# Patient Record
Sex: Male | Born: 1960
Health system: Southern US, Community
[De-identification: ages and names within clinical notes are randomized; demographics above are authoritative.]

## PROBLEM LIST (undated history)

## (undated) DIAGNOSIS — I10 Essential (primary) hypertension: Secondary | ICD-10-CM

## (undated) DIAGNOSIS — K219 Gastro-esophageal reflux disease without esophagitis: Secondary | ICD-10-CM

## (undated) DIAGNOSIS — F32A Depression, unspecified: Secondary | ICD-10-CM

## (undated) DIAGNOSIS — Z972 Presence of dental prosthetic device (complete) (partial): Secondary | ICD-10-CM

## (undated) DIAGNOSIS — M1990 Unspecified osteoarthritis, unspecified site: Secondary | ICD-10-CM

## (undated) DIAGNOSIS — F329 Major depressive disorder, single episode, unspecified: Secondary | ICD-10-CM

## (undated) DIAGNOSIS — M19019 Primary osteoarthritis, unspecified shoulder: Secondary | ICD-10-CM

## (undated) DIAGNOSIS — K429 Umbilical hernia without obstruction or gangrene: Secondary | ICD-10-CM

## (undated) DIAGNOSIS — F419 Anxiety disorder, unspecified: Secondary | ICD-10-CM

## (undated) DIAGNOSIS — A488 Other specified bacterial diseases: Secondary | ICD-10-CM

## (undated) DIAGNOSIS — R569 Unspecified convulsions: Secondary | ICD-10-CM

## (undated) HISTORY — PX: FRACTURE SURGERY: SHX138

## (undated) HISTORY — PX: KNEE ARTHROCENTESIS: SUR44

## (undated) HISTORY — PX: MULTIPLE TOOTH EXTRACTIONS: SHX2053

## (undated) HISTORY — PX: JOINT REPLACEMENT: SHX530

---
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
## 2006-02-26 ENCOUNTER — Encounter: Admission: RE | Admit: 2006-02-26 | Discharge: 2006-05-27 | Payer: Self-pay | Admitting: Psychology

## 2006-06-08 ENCOUNTER — Encounter
Admission: RE | Admit: 2006-06-08 | Discharge: 2006-09-06 | Payer: Self-pay | Admitting: Physical Medicine & Rehabilitation

## 2006-06-08 ENCOUNTER — Ambulatory Visit: Payer: Self-pay | Admitting: Physical Medicine & Rehabilitation

## 2006-08-03 HISTORY — PX: TOTAL KNEE ARTHROPLASTY: SHX125

## 2006-08-04 ENCOUNTER — Ambulatory Visit: Payer: Self-pay | Admitting: Physical Medicine & Rehabilitation

## 2006-08-23 ENCOUNTER — Encounter
Admission: RE | Admit: 2006-08-23 | Discharge: 2006-11-21 | Payer: Self-pay | Admitting: Physical Medicine & Rehabilitation

## 2006-09-03 ENCOUNTER — Encounter: Admission: RE | Admit: 2006-09-03 | Discharge: 2006-12-02 | Payer: Self-pay | Admitting: Psychology

## 2006-09-20 ENCOUNTER — Ambulatory Visit: Payer: Self-pay | Admitting: Physical Medicine & Rehabilitation

## 2006-11-10 ENCOUNTER — Ambulatory Visit: Payer: Self-pay | Admitting: Physical Medicine & Rehabilitation

## 2006-11-17 ENCOUNTER — Inpatient Hospital Stay (HOSPITAL_COMMUNITY): Admission: RE | Admit: 2006-11-17 | Discharge: 2006-11-21 | Payer: Self-pay | Admitting: Orthopedic Surgery

## 2006-12-31 ENCOUNTER — Ambulatory Visit: Payer: Self-pay | Admitting: Physical Medicine & Rehabilitation

## 2006-12-31 ENCOUNTER — Encounter
Admission: RE | Admit: 2006-12-31 | Discharge: 2007-03-31 | Payer: Self-pay | Admitting: Physical Medicine & Rehabilitation

## 2007-03-25 ENCOUNTER — Ambulatory Visit: Payer: Self-pay | Admitting: Physical Medicine & Rehabilitation

## 2007-05-20 ENCOUNTER — Encounter
Admission: RE | Admit: 2007-05-20 | Discharge: 2007-08-18 | Payer: Self-pay | Admitting: Physical Medicine & Rehabilitation

## 2007-05-25 ENCOUNTER — Ambulatory Visit: Payer: Self-pay | Admitting: Physical Medicine & Rehabilitation

## 2007-06-04 HISTORY — PX: SHOULDER ARTHROSCOPY: SHX128

## 2007-06-22 ENCOUNTER — Ambulatory Visit (HOSPITAL_BASED_OUTPATIENT_CLINIC_OR_DEPARTMENT_OTHER): Admission: RE | Admit: 2007-06-22 | Discharge: 2007-06-22 | Payer: Self-pay | Admitting: Orthopedic Surgery

## 2007-07-22 ENCOUNTER — Ambulatory Visit: Payer: Self-pay | Admitting: Physical Medicine & Rehabilitation

## 2007-08-18 ENCOUNTER — Encounter
Admission: RE | Admit: 2007-08-18 | Discharge: 2007-11-16 | Payer: Self-pay | Admitting: Physical Medicine & Rehabilitation

## 2007-09-14 ENCOUNTER — Ambulatory Visit: Payer: Self-pay | Admitting: Physical Medicine & Rehabilitation

## 2007-10-20 ENCOUNTER — Encounter
Admission: RE | Admit: 2007-10-20 | Discharge: 2008-01-18 | Payer: Self-pay | Admitting: Physical Medicine & Rehabilitation

## 2007-10-20 ENCOUNTER — Ambulatory Visit: Payer: Self-pay | Admitting: Physical Medicine & Rehabilitation

## 2007-11-23 ENCOUNTER — Ambulatory Visit (HOSPITAL_BASED_OUTPATIENT_CLINIC_OR_DEPARTMENT_OTHER): Admission: RE | Admit: 2007-11-23 | Discharge: 2007-11-24 | Payer: Self-pay | Admitting: Orthopedic Surgery

## 2007-11-24 HISTORY — PX: ANKLE FUSION: SHX881

## 2007-12-15 ENCOUNTER — Ambulatory Visit: Payer: Self-pay | Admitting: Physical Medicine & Rehabilitation

## 2008-02-07 ENCOUNTER — Encounter
Admission: RE | Admit: 2008-02-07 | Discharge: 2008-02-08 | Payer: Self-pay | Admitting: Physical Medicine & Rehabilitation

## 2008-02-08 ENCOUNTER — Ambulatory Visit: Payer: Self-pay | Admitting: Physical Medicine & Rehabilitation

## 2008-04-04 ENCOUNTER — Encounter
Admission: RE | Admit: 2008-04-04 | Discharge: 2008-06-05 | Payer: Self-pay | Admitting: Physical Medicine & Rehabilitation

## 2008-04-04 ENCOUNTER — Ambulatory Visit: Payer: Self-pay | Admitting: Physical Medicine & Rehabilitation

## 2008-06-05 ENCOUNTER — Ambulatory Visit: Payer: Self-pay | Admitting: Physical Medicine & Rehabilitation

## 2008-09-25 ENCOUNTER — Encounter
Admission: RE | Admit: 2008-09-25 | Discharge: 2008-09-26 | Payer: Self-pay | Admitting: Physical Medicine & Rehabilitation

## 2008-09-26 ENCOUNTER — Ambulatory Visit: Payer: Self-pay | Admitting: Physical Medicine & Rehabilitation

## 2009-03-05 ENCOUNTER — Encounter
Admission: RE | Admit: 2009-03-05 | Discharge: 2009-06-03 | Payer: Self-pay | Admitting: Physical Medicine & Rehabilitation

## 2009-03-13 ENCOUNTER — Ambulatory Visit: Payer: Self-pay | Admitting: Physical Medicine & Rehabilitation

## 2009-08-03 DIAGNOSIS — A488 Other specified bacterial diseases: Secondary | ICD-10-CM

## 2009-08-03 HISTORY — DX: Other specified bacterial diseases: A48.8

## 2009-09-03 ENCOUNTER — Encounter
Admission: RE | Admit: 2009-09-03 | Discharge: 2009-09-04 | Payer: Self-pay | Admitting: Physical Medicine & Rehabilitation

## 2009-09-04 ENCOUNTER — Ambulatory Visit: Payer: Self-pay | Admitting: Physical Medicine & Rehabilitation

## 2010-02-18 ENCOUNTER — Encounter
Admission: RE | Admit: 2010-02-18 | Discharge: 2010-05-19 | Payer: Self-pay | Admitting: Physical Medicine & Rehabilitation

## 2010-03-24 ENCOUNTER — Ambulatory Visit: Payer: Self-pay | Admitting: Physical Medicine & Rehabilitation

## 2010-03-29 ENCOUNTER — Emergency Department (HOSPITAL_COMMUNITY): Admission: EM | Admit: 2010-03-29 | Discharge: 2010-03-29 | Payer: Self-pay | Admitting: Emergency Medicine

## 2010-03-29 ENCOUNTER — Ambulatory Visit: Payer: Self-pay | Admitting: Vascular Surgery

## 2010-03-29 ENCOUNTER — Encounter (INDEPENDENT_AMBULATORY_CARE_PROVIDER_SITE_OTHER): Payer: Self-pay | Admitting: Emergency Medicine

## 2010-04-01 ENCOUNTER — Ambulatory Visit: Payer: Self-pay | Admitting: Internal Medicine

## 2010-04-01 ENCOUNTER — Inpatient Hospital Stay (HOSPITAL_COMMUNITY): Admission: AD | Admit: 2010-04-01 | Discharge: 2010-04-08 | Payer: Self-pay | Admitting: Orthopedic Surgery

## 2010-04-01 DIAGNOSIS — M90869 Osteopathy in diseases classified elsewhere, unspecified lower leg: Secondary | ICD-10-CM | POA: Insufficient documentation

## 2010-04-16 ENCOUNTER — Encounter (INDEPENDENT_AMBULATORY_CARE_PROVIDER_SITE_OTHER): Payer: Self-pay | Admitting: *Deleted

## 2010-04-16 DIAGNOSIS — Z96659 Presence of unspecified artificial knee joint: Secondary | ICD-10-CM

## 2010-04-16 DIAGNOSIS — I10 Essential (primary) hypertension: Secondary | ICD-10-CM

## 2010-09-02 NOTE — Miscellaneous (Signed)
Summary: Problems, Medications and Alleriges updated  Clinical Lists Changes  Problems: Added new problem of HYPERTENSION (ICD-401.9) Added new problem of OTH INFS INVOLVING BONE DZ CLASS ELSW LOWER LEG (ICD-730.86) Added new problem of KNEE JOINT REPLACEMENT BY OTHER MEANS (ICD-V43.65) - left knee Medications: Added new medication of CIPRO 750 MG TABS (CIPROFLOXACIN HCL) Take 1 tablet by mouth two times a day Added new medication of RIFAMPIN 300 MG CAPS (RIFAMPIN) Take 1 capsule by mouth two times a day Observations: Added new observation of NKA: T (04/16/2010 14:18)

## 2010-09-08 ENCOUNTER — Ambulatory Visit: Payer: Worker's Compensation | Attending: Physical Medicine & Rehabilitation

## 2010-09-08 ENCOUNTER — Ambulatory Visit: Payer: Worker's Compensation | Admitting: Physical Medicine & Rehabilitation

## 2010-09-08 DIAGNOSIS — S82109A Unspecified fracture of upper end of unspecified tibia, initial encounter for closed fracture: Secondary | ICD-10-CM

## 2010-09-08 DIAGNOSIS — Y839 Surgical procedure, unspecified as the cause of abnormal reaction of the patient, or of later complication, without mention of misadventure at the time of the procedure: Secondary | ICD-10-CM | POA: Insufficient documentation

## 2010-09-08 DIAGNOSIS — T8450XA Infection and inflammatory reaction due to unspecified internal joint prosthesis, initial encounter: Secondary | ICD-10-CM | POA: Insufficient documentation

## 2010-09-08 DIAGNOSIS — M1710 Unilateral primary osteoarthritis, unspecified knee: Secondary | ICD-10-CM

## 2010-09-08 DIAGNOSIS — Z96659 Presence of unspecified artificial knee joint: Secondary | ICD-10-CM | POA: Insufficient documentation

## 2010-09-08 DIAGNOSIS — F1210 Cannabis abuse, uncomplicated: Secondary | ICD-10-CM | POA: Insufficient documentation

## 2010-10-16 LAB — BASIC METABOLIC PANEL
BUN: 5 mg/dL — ABNORMAL LOW (ref 6–23)
BUN: 6 mg/dL (ref 6–23)
CO2: 29 mEq/L (ref 19–32)
CO2: 32 mEq/L (ref 19–32)
Calcium: 8.4 mg/dL (ref 8.4–10.5)
Chloride: 101 mEq/L (ref 96–112)
Chloride: 97 mEq/L (ref 96–112)
Creatinine, Ser: 0.97 mg/dL (ref 0.4–1.5)
GFR calc Af Amer: >60 mL/min (ref >60)
GFR calc non Af Amer: >60 mL/min (ref >60)
GFR calc non Af Amer: >60 mL/min (ref >60)
Potassium: 3.8 mEq/L (ref 3.5–5.1)
Potassium: 4.2 mEq/L (ref 3.5–5.1)

## 2010-10-16 LAB — DIFFERENTIAL
Basophils Absolute: 0.1 10*3/uL (ref 0.0–0.1)
Basophils Relative: 1 % (ref 0–1)
Lymphocytes Relative: 17 % (ref 12–46)
Lymphs Abs: 1.6 10*3/uL (ref 0.7–4.0)
Neutro Abs: 6.5 10*3/uL (ref 1.7–7.7)

## 2010-10-16 LAB — PROTIME-INR
INR: 1.04 (ref 0.00–1.49)
INR: 1.18 (ref 0.00–1.49)
INR: 1.31 (ref 0.00–1.49)
INR: 1.33 (ref 0.00–1.49)
INR: 1.76 — ABNORMAL HIGH (ref 0.00–1.49)
Prothrombin Time: 13.8 seconds (ref 11.6–15.2)
Prothrombin Time: 16.7 seconds — ABNORMAL HIGH (ref 11.6–15.2)
Prothrombin Time: 17.7 seconds — ABNORMAL HIGH (ref 11.6–15.2)

## 2010-10-16 LAB — CBC
Hemoglobin: 15.9 g/dL (ref 13.0–17.0)
MCHC: 33.5 g/dL (ref 30.0–36.0)
MCV: 92.2 fL (ref 78.0–100.0)
MCV: 93.1 fL (ref 78.0–100.0)
RBC: 5.09 MIL/uL (ref 4.22–5.81)
RDW: 14.1 % (ref 11.5–15.5)
WBC: 12.5 10*3/uL — ABNORMAL HIGH (ref 4.0–10.5)
WBC: 9.7 10*3/uL (ref 4.0–10.5)

## 2010-10-16 LAB — HIV ANTIBODY (ROUTINE TESTING W REFLEX): HIV: NONREACTIVE

## 2010-10-16 LAB — SEDIMENTATION RATE: Sed Rate: 20 mm/hr — ABNORMAL HIGH (ref 0–16)

## 2010-10-17 LAB — BASIC METABOLIC PANEL
BUN: 6 mg/dL (ref 6–23)
Calcium: 9 mg/dL (ref 8.4–10.5)
Chloride: 105 mEq/L (ref 96–112)
GFR calc Af Amer: >60 mL/min (ref >60)
GFR calc non Af Amer: >60 mL/min (ref >60)

## 2010-10-17 LAB — DIFFERENTIAL
Basophils Absolute: 0 10*3/uL (ref 0.0–0.1)
Eosinophils Absolute: 0.2 10*3/uL (ref 0.0–0.7)
Eosinophils Relative: 2 % (ref 0–5)
Lymphs Abs: 1.2 10*3/uL (ref 0.7–4.0)
Monocytes Absolute: 1.2 10*3/uL — ABNORMAL HIGH (ref 0.1–1.0)

## 2010-10-17 LAB — PROTIME-INR: Prothrombin Time: 13.5 seconds (ref 11.6–15.2)

## 2010-10-17 LAB — GRAM STAIN

## 2010-10-17 LAB — CBC
HCT: 50.7 % (ref 39.0–52.0)
HCT: 55.8 % — ABNORMAL HIGH (ref 39.0–52.0)
Hemoglobin: 19.3 g/dL — ABNORMAL HIGH (ref 13.0–17.0)
MCH: 31.2 pg (ref 26.0–34.0)
MCH: 32.1 pg (ref 26.0–34.0)
MCHC: 33.5 g/dL (ref 30.0–36.0)
MCV: 92.7 fL (ref 78.0–100.0)
Platelets: 244 10*3/uL (ref 150–400)
RDW: 14.3 % (ref 11.5–15.5)
RDW: 14.4 % (ref 11.5–15.5)
WBC: 11.8 10*3/uL — ABNORMAL HIGH (ref 4.0–10.5)

## 2010-10-17 LAB — BODY FLUID CULTURE

## 2010-10-17 LAB — URINALYSIS, ROUTINE W REFLEX MICROSCOPIC
Bilirubin Urine: NEGATIVE
Hgb urine dipstick: NEGATIVE
Ketones, ur: NEGATIVE mg/dL
Protein, ur: NEGATIVE mg/dL
Urobilinogen, UA: 0.2 mg/dL (ref 0.0–1.0)

## 2010-10-17 LAB — SYNOVIAL CELL COUNT + DIFF, W/ CRYSTALS: Neutrophil, Synovial: 87 % — ABNORMAL HIGH (ref 0–25)

## 2010-12-16 NOTE — Assessment & Plan Note (Signed)
Zachary Holmes is back regarding his chronic pain.  He states he has been  feeling quite well over the last couple months.  He has left shoulder  surgery and shoulder is doing much better.  His right ankle and foot  give him problems, and Dr. Turner Daniels is looking at potential ankle fusion  surgery.  Left knee seems to be holding up fairly well.  The patient  rates his pain 6/10 to 7/10 today.  He uses his Avinza, usually 60 mg  daily.  He is on timolol 50 mg daily for blood pressure control.  He is  on Cymbalta 60 mg daily.  He uses temazepam at bedtime p.r.n., as well  as Lipitor patch p.r.n. q.12h.   REVIEW OF SYSTEMS:  As noted for the above.  Full review is in the  written health and history section of the chart.   SOCIAL HISTORY:  The patient has been involved with vocational rehab  about job reentry, but has not had a whole lot of success thus far.   PHYSICAL EXAM:  Blood pressure is 144/87, pulse 87, respiratory rate 18,  satting 95% on room air.  The patient is pleasant, alert and oriented x3.  Affect is bright and  appropriate.  He uses a cane still for balance and walking and has  antalgia on the right side, but is much improved with the fluidity of  his gait.  Range of motion is improved in the left shoulder with active  and passive movement.  HEART:  Regular.  CHEST:  Clear.  ABDOMEN:  Soft and nontender.  The patient is in good spirits.   ASSESSMENT:  1. Left tibial plateau fracture and post-traumatic arthritis status      post left total knee replacement.  2. Right calcaneal fracture.  3. Left rotator cuff injury and degenerative joint disease of left      shoulder.  4. History of depression.  5. History of hypertension.   PLAN:  1. We will increase atenolol to 75 mg daily.  2. Continue Avinza 60 mg daily with perhaps a goal of decreasing after      his potential right ankle surgery.  3. Continue Cymbalta as dosed.  4. I will see him back in 4 months with nurse clinic  followup in 2      months' time.  He is given 2 months' of Avinza prescriptions today.      Ranelle Oyster, M.D.  Electronically Signed     ZTS/MedQ  D:  10/21/2007 14:27:50  T:  10/21/2007 14:42:37  Job #:  [id number]   cc:   Lewayne Bunting, RN  [facility]

## 2010-12-16 NOTE — Discharge Summary (Signed)
NAMELERAY, GARVERICK              ACCOUNT NO.:  [id number]   MEDICAL RECORD NO.:  [id number]          PATIENT TYPE:  INP   LOCATION:  5009                         FACILITY:  [facility]   PHYSICIAN:  Feliberto Gottron. Turner Daniels, M.D.   DATE OF BIRTH:  01/20/1961   DATE OF ADMISSION:  11/17/2006  DATE OF DISCHARGE:  11/21/2006                               DISCHARGE SUMMARY   DIAGNOSIS FOR THIS ADMISSION:  End-stage degenerative joint disease of  the left knee, posttraumatic.   PROCEDURE WHILE IN HOSPITAL:  Left total knee arthroplasty.   HISTORY OF PRESENT ILLNESS:  The patient is a 50 year old man who was  injured at work with a displaced tibial plateau fracture that under open  reduction internal fixation approximately 2 years ago.  Unfortunately,  __________ tibial plateau.  There was continued depression of the  fragments resulting in end-stage arthritis of the knee.  He has not been  able to bear any significant weight on the knee for about 2 years  because of these findings.  Total knee arthroplasty has been discussed  with the patient.  Risks and benefits were explained, questions  answered, and he is prepared for surgical intervention.   ALLERGIES:  No known drug allergies.   MEDICATIONS AT THE TIME OF ADMISSION:  Cymbalta and Avandia.   PAST MEDICAL HISTORY:  Childhood diseases.  Adult history:  Hypertension  and depression.   PAST SURGICAL HISTORY:  ORIF of left tibial plateau fracture, right  calcaneus fracture in 2006.   SOCIAL HISTORY:  One-quarter-pack-per-day tobacco, 6 alcoholic beverages  per week, no IV drug abuse.  He is disabled.   FAMILY HISTORY:  Unremarkable.   REVIEW OF SYSTEMS:  Denies shortness of breath, chest pain, or recent  illness.   PHYSICAL EXAMINATION:  VITAL SIGNS:  Temperature 98.0, pulse 100,  respirations 18, blood pressure 130/80.  The patient is 6-foot, 255-  pound male.  HEENT:  Head is normocephalic, atraumatic.  Ears:  TMs clear.  Eyes:  Pupils  equal, round and reactive to light and accommodation.  Nose:  Patent.  Throat:  Benign.  NECK:  Supple.  Full range of motion.  CHEST:  Clear to auscultation and percussion.  HEART:  Regular rate and rhythm.  ABDOMEN:  Soft and nontender.  EXTREMITIES:  Left knee range of motion 10 to 100 degrees, stable  ligamentously.  Positive effusion and crepitus.  SKIN:  Shows a well-healed, normal scar.   RADIOLOGICAL STUDIES:  X-rays show bone-on-bone change of the left knee.   IMPRESSION:  End-stage degenerative joint disease of the left knee.   LABORATORY DATA:  Preoperative labs, including CBC, CMET, chest x-ray,  EKG, PT and PTT were all within normal limits with the exception of a  WBC of 15.9.   HOSPITAL COURSE:  On the day of admission, the patient was taken to the  operating room at [facility] where he underwent a left total knee  arthroplasty using a diffuse segment RP component, #6 left femoral  component and #6 tibial base plate, a 10 mm Sigma RP spacer, 41 mm  patellar button.  All components cemented with Dupuy fast-set cement  with a double batch of __________ Zinacef  applied.  The patient was  placed on preoperative antibiotics, placed on postoperative Coumadin  prophylaxis and bridging Lovenox per pharmacy protocol.  Physical  therapist began in the recovery room.  The patient was placed on a  postoperative PCA for pain control.   On postoperative day 1, the patient was awake and alert in moderate  pain, taking p.o. well, no nausea or vomiting.  Vital signs are stable.  Hemoglobin 11.8, O2 saturation 99%.  Urine output 1500.  He was  otherwise stable and continued with CPN and physical therapy.  On  postoperative day 2, the patient was awake and alert, in moderate pain  __________.  Walked in physical therapy gym with a walker.  Vital signs  were stable.  Wounds clean and dry, minimal drainage.  Range of motion 8  to 70 degrees; otherwise neurovascularly intact.  INR  1.8.  The patient  was otherwise improving and stable.  On postoperative day 3, the patient  was awake and alert, and walked some 200 feet.  No nausea or vomiting.  Vital signs are stable.  __________ scant drainage.  He was  neurovascularly intact.  INR 1.9.  Range of motion 10-75 degrees.  X-  rays showed well-placed __________ prosthesis.  He was considered to be  otherwise medically stable and orthopedically improved.  He was  discharged home in the care of his family.  He is to return to see Dr.  Turner Daniels in approximately 1 week's time, or sooner if he should have an  increased temperature, drainage from wound or pain that is not well  controlled by pain medication.  He will have home health nursing for PT  draws for his Coumadin treatment.  He will also have wound care and  physical therapy, including CPN.   FINAL DIAGNOSIS:  Posttraumatic osteoarthritis of the left knee.   DIET:  Regular.   WOUND CARE:  Dressing changes p.r.n.      Laural Benes. Jannet Mantis.      Feliberto Gottron. Turner Daniels, M.D.  Electronically Signed    JBR/MEDQ  D:  02/02/2007  T:  02/02/2007  Job:  [id number]

## 2010-12-16 NOTE — Assessment & Plan Note (Signed)
Zachary Holmes is back regarding his chronic pain.  He had right ankle fusion 2-  1/2 months ago.  He is following up with Dr. Phylliss Bob in this week.  So far,  there have been no problems.  His left knee is doing well, although he  still lacks a bit of extension.  Shoulders have been acting up with his  crutches and he was advanced to a cane allowing 30 pounds of weight on  the right foot.  The shoulders have eased off a bit in pain since then.  He rates his pain as 6-7/10.  He remains on Avinza 60 mg daily as well  as Cymbalta.  I increased his atenolol at last visit for hypertension.   REVIEW OF SYSTEMS:  The patient reports trouble walking; some  depression, anxiety still.  Other pertinent positives listed in the  above paragraphs and full review is in the written health and history  section.   SOCIAL HISTORY:  The patient is unchanged and has been involved with  vocational rehab over the last few months.   PHYSICAL EXAMINATION:  VITAL SIGNS:  Blood pressure is 139/97, pulse 61,  and respiratory rate 18.  He is sating 96% on room air.  GENERAL:  The patient is a pleasant, alert and oriented x3.  Affect is  bright and appropriate.  MUSCULOSKELETAL:  Left knee extends to about -5 from full extension.  Right ankle is in boot.  He has full range of motion in left shoulder,  although some pain is noted in the shoulder with excessive rotation and  abduction.  HEART:  Regular.  CHEST:  Clear.  ABDOMEN:  Soft and nontender.   ASSESSMENT:  1. Left tibial plateau fracture and posttraumatic arthritis status      post left total knee replacement.  2. Right calcaneal fracture and chronic ankle/foot pain.  3. Left rotator cuff injury status post surgical treatment.  4. Depression.  5. Hypertension.   PLAN:  1. Continue Avinza 60 mg daily for resolution of his ankle problems, I      would like to taper his Avinza down this fall.  2. Continue Cymbalta.  3. Maintain atenolol 75 mg daily.  Focus on  diet and salt reduction.      He may need a second agent.  He needs a PCP.  4. I will see him back in 4 months' time with a Nurse Clinic followup      in 2 months.      Ranelle Oyster, M.D.  Electronically Signed     ZTS/MedQ  D:  02/08/2008 11:53:48  T:  02/09/2008 81:19:14  Job #:  [id number]

## 2010-12-16 NOTE — Assessment & Plan Note (Signed)
Zachary Holmes is back regarding his multiple orthopedic issues.  About 2-1/2  months ago, he hyperextended his knee when he fell going up steps.  He  felt a stretch and essentially a pop.  Since then, he has had a lot of  pain and increased swelling at the knee.  He states he feels that he has  a marble under his patella.  Nothing really has improved over the last  few months.  Continue to try to walk and stay as active as possible.  He  can only go about 5 minutes at a time without stopping.  He also reports  increased nausea and a knottng, pitting feeling in his upper abdomen.  It happens anytime throughout the day, particularly when his stomach is  empty.  He did have some nausea previously while on the Avinza, but this  has been a problem even since he came off the medication.   The patient rates his pain 5/10, described it as sharp, stabbing,  constant.  Pain interferes with general activity, relations with others,  enjoyment of life on a mild-to-moderate level.   REVIEW OF SYSTEMS:  Notable for the above as well as depression,  anxiety.  Full review is in the written health and history section of  the chart.   SOCIAL HISTORY:  The patient is single and living alone.   PHYSICAL EXAMINATION:  Blood pressure is 150/94, pulse is 92,  respiratory rate 18.  He is sating 97% on room air.  The patient is  pleasant, alert and oriented x3.  He continues to have extension lag at  the left knee.  Left patella was freely movable and there was  significant crepitus at the knee with passive movement of the patella  today.  He had crepitus also with knee flexion and extension.  There was  some mild-to-moderate peripatellar swelling.  He was a bit weaker with  knee extension today at 4/5.  Gait remains antalgic on the right as well  as the left side today.  Right ankle was stable.  Heart was regular.  Chest was clear.  Abdomen was soft, nontender.  Bowel sounds were  positive.   ASSESSMENT:  1.  Left tibial plateau fracture and posttraumatic arthritis status      post left knee replacement.  The patient with recent fall and      increased left knee pain and crepitus.  2. Right calcaneal fracture and chronic ankle/foot pain.  3. Left rotator cuff injury.  4. Depression.  5. Hypertension.  6. Marijuana use.  7. Persistent nausea   PLAN:  1. Recommended Protonix 40 mg daily for GI complaints.  It is entirely      feasible that his stomach complaints are related to his chronic      NSAID use.  It could be worthwhile to have him see a      gastroenterologist for his persistent symptoms.  He may have a      hiatal hernia and could benefit from a barium swallow as well.  I      would like to refer him to [facility].  2. Continue atenolol for blood pressure.  He has not taken this      morning's exam yet.  3. Recommend followup with Dr. Turner Daniels for reassessment of left knee      potential x-rays.  I am concerned that he      may have loosened some of his component perhaps.  4. I will see  him back in about 6 months' time.  He will call me with      any questions.      Ranelle Oyster, M.D.  Electronically Signed     ZTS/MedQ  D:  09/26/2008 11:42:48  T:  09/27/2008 00:04:01  Job #:  [id number]   cc:   Carolee Rota, RN, CDMS, CCM

## 2010-12-16 NOTE — Op Note (Signed)
Zachary, Holmes              ACCOUNT NO.:  [id number]   MEDICAL RECORD NO.:  [id number]          PATIENT TYPE:  AMB   LOCATION:  DSC                          FACILITY:  [facility]   PHYSICIAN:  Feliberto Gottron. Turner Daniels, M.D.   DATE OF BIRTH:  12-Feb-1961   DATE OF PROCEDURE:  11/24/2007  DATE OF DISCHARGE:  11/24/2007                               OPERATIVE REPORT   PREOPERATIVE DIAGNOSES:  1. End-stage arthritis.  2. Post-traumatic right subtalar joint.   POSTOPERATIVE DIAGNOSES:  1. End-stage arthritis.  2. Post-traumatic right subtalar joint.   PROCEDURE:  Right subtalar fusion using 2 of the Synthes 6.5 partially-  threaded lag screws as well as a small InFuse BMP bone graft.   SURGEON:  Feliberto Gottron.  Turner Daniels, MD   FIRST ASSISTANT:  Shirl Harris PA-C   ANESTHETIC:  General LMA plus I believe he had an ankle block or a  popliteal nerve block.   FLUID REPLACEMENT:  800 mL crystalloid.   DRAINS PLACED:  None.   TOURNIQUET TIME:  1 hour and 10 minutes.   INDICATIONS FOR PROCEDURE:  This 50 year old gentleman known to me after  some relatively severe trauma that he sustained a work.  I have replaced  1 of his knees.  The trauma actually occurred some years ago and has a  residual.  He had subtalar arthritis on the right where he had a plating  of the calcaneal fracture done elsewhere and he also had a talus  fracture I believe on the left side and he has had a left total knee by  me in the interval.  In any event, he has gone onto develop end-stage  arthritis of the right subtalar joint.  He got good temporary relief  with a cortisone injection as virtually no subtalar motion and desires  elective subtalar fusion instrumented with couple of 6.5-mm partially-  threaded Synthes screws and InFuse BMP pledgets to enhance the  arthrodesis.  Risks and benefits of surgery discussed, questions  answered.  The main indication for this surgery is severe unremitting  pain.   DESCRIPTION OF  PROCEDURE:  The patient was identified by armband and  underwent popliteal nerve block at the [facility] in the  block area.  He was then taken to the operating room where the  appropriate anesthetic monitors were attached and general LMA anesthesia  induced.  The tourniquet was applied to the middle of the right calf and  the right lower extremity prepped and draped in the sterile fashion from  the toes to the tourniquet.  We then wrapped limb with an Esmarch  bandage and inflated the tourniquet to 250 mmHg, and a standard time-out  procedure was performed, and prior to going back to the room, the  patient did receive 1 gm of IV antibiotic.  At this point, we went ahead  and made a standard oliers-type incision into the lateral aspect of the  right ankle starting at the sinus tarsi and going posteriorly and  slightly inferiorly over a distance of about 4-5 cm.  Small bleeders in  the skin  and subcutaneous tissue were identified and cauterized.  Using  [facility] C-arm control, we then identified the subtalar joint, the sinus  tarsi, and set about removing any remaining cartilage from the posterior  and middle facets of the subtalar joint as well as the anterior facet.  A lamina spreader was used to enhance the exposure and after we had  removed as much as the cartilage as we could probably 90-95% of it and  gotten down to some cortical bone that we then scored.  Local bone graft  was then harvested from the lateral aspect of the calcaneus and this was  then loosely packed into the subtalar joint.  Under C-arm imaging  control, we then picked out 2 entry points on the posterior aspect of  the calcaneus just above the fat pad and drilled for a pair of 6.5  partially-threaded screws controlling the entry point of the drill on  the AP and lateral planes using a 4.5 drill and again cleaning out the  fluid to the drill and using that as a local bone graft as well.  We  then inserted the  screws until they were just about ready to be  tightened down, but the subtalar joint was still gapped from the lamina  spreaders.  The InFuse pledgets were then placed in the middle and  posterior facets, and at this point, we cinched down the lag screws  obtaining good firm fixation documented by [facility] image control.  Prior to  the placement of the InFuse pledgets, the wound was irrigated along with  normal saline solution.  At this point, the tourniquet was let down.  Small bleeders were identified and cauterized.  Final C-arm images were  taken confirming good firm fixation of the subtalar joint and then the  subcutaneous tissue was closed with running 2-0 Vicryl suture and the  skin with a running interlocking 3-0 nylon suture.  A dressing of  Xeroform, 4x4 dressings, sponges, Webril, and Ace wrap, and a Cam walker  boot was then applied.  The patient was then awakened and taken to the  recovery room without difficulty.      Feliberto Gottron. Turner Daniels, M.D.  Electronically Signed     FJR/MEDQ  D:  11/27/2007  T:  11/28/2007  Job:  [id number]

## 2010-12-16 NOTE — Assessment & Plan Note (Signed)
Zachary Holmes is back regarding his pain complaints.  He had left shoulder  surgery about a month ago and the shoulder has been a bit better, but  during his rehab, apparently he injured his biceps.  His biceps tendon  may be torn.  He is going for an MRI in June.  His right ankle still  bothers him somewhat.  His right shoulder has been giving him pain over  the last few months given the fact that he has been favoring it more.  Left knee is improved in general, although is still somewhat tender.  He  rates the pain 5/10 to 6/10.  He describes it as sharp, stabbing, and  constant.  Pain interferes with his general activity, relations with  others, and enjoyment of life on mild to moderate levels.   REVIEW OF SYSTEMS:  Notable for the above.  Full review is in the  written health and history section of the chart.   MEDICATIONS:  1. Cymbalta 60 mg daily.  2. Flector patch p.r.n. q.12.  3. Avinza 60 mg daily.  4. Atenolol 25 mg daily.  5. Temazepam p.r.n. at bedtime.   SOCIAL HISTORY:  The patient is living with his girlfriend.  No other  changes are noted.   PHYSICAL EXAM:  Blood pressure is 158/89, pulse 82, respiratory rate 18,  satting 97% on room air.  The patient is pleasant, alert and oriented x3.  Still has some antalgia  to the left.  He is walking better, but uses a cane for balance.  Right  shoulder is tender with rotator cuff maneuvers today.  Pain was more  posterior than anywhere else.  The left shoulder had some tightness and  pain with range of motion.  He had some bruising over the biceps tendon  areas.  HEART:  Regular.  CHEST:  Clear.  ABDOMEN:  Soft and nontender.   ASSESSMENT:  1. Left tibial plateau fracture and post-traumatic arthritis status      post left total knee replacement.  2. Right calcaneal fracture.  3. Left rotator cuff injury and DJD of the left shoulder.  4. History of depression.  5. History of marijuana use.  6. History of hypertension.   PLAN:  1. Increase atenolol to 50 mg daily.  2. Refilled Avinza 60 mg daily.  3. Continue Cymbalta.  4. After informed consent, we injected the right shoulder via      posterior approach using 40 mg Kenalog and 3 mL      1% lidocaine.  The patient tolerated it well.  5. Followup with orthopedics.  Plan is per their office regarding his      shoulder.  MRI is pending.      Ranelle Oyster, M.D.  Electronically Signed     ZTS/MedQ  D:  07/25/2007 12:13:34  T:  07/25/2007 15:19:00  Job #:  [id number]   cc:   Lewayne Bunting, RN  [facility]

## 2010-12-16 NOTE — Assessment & Plan Note (Signed)
Yong is back and doing fairly well. His knee pain is substantially  better. He is seeing Dr. Turner Daniels for his left shoulder and work up is  underway. He did get a right shoe orthotic apparently to equal his leg  length out. He has noticed that the right aspect of the orthotic seems  to be cutting into his heel near his prior hardware. Patient rates his  pain at 5 to 6 out of 10. He describes it as sharp and stabbing. He is  more active walking and moving around. Sleep is fair. He decreased his  Avinza on his own to once daily and doing fairly well with this.   REVIEW OF SYSTEMS:  Notable for the above. Full review is in the written  health and history section in the chart.   SOCIAL HISTORY:  Without change.   PHYSICAL EXAMINATION:  Blood pressure is 148/84, pulse 71, respiratory  rate 18, sating 97% in room air. Patient is pleasant, alert and oriented  x3. He is less antalgic on the left leg today. He has good range of  motion however in the leg. His left shoulder has rotator cuff signs and  limited somewhat inactive rotation, adduction flexion, etc. Exam in  right shoe orthotic and it did have a pronounced raised edge on both  medial and lateral borders.  HEART: Regular.  CHEST: Clear.  ABDOMEN: Soft, nontender.   ASSESSMENT:  1. Left tibial plateau fracture and post traumatic arthritis, status      post left total knee replacement with nice results. Patient's range      of motion is improved.  2. History of right calcaneal fracture with residual ankle and foot      pain.  3. Rotator cuff injury and arthritic disease of the left shoulder.  4. Left subdeltoid bursitis.  5. History of depression.  6. History of marijuana use.  7. Hypertension.   PLAN:  1. Continue Avinza 60 mg daily.  Ultimately I would like to see him      reduced to 30 mg. We can shoot for this towards the end of the fall      or early winter.  2. Continue Cymbalta 60 mg daily and Restoril at bedtime.  3.  Follow up with Dr. Turner Daniels regarding left shoulder.  4. Recommended orthotic adjustment to decrease the severity of the lip      on the lateral aspect of the heel lift.  5. I will see the patient back in 4 months. He will see the nurse      clinic for follow up in approximately 2 months time.      Ranelle Oyster, M.D.  Electronically Signed     ZTS/MedQ  D:  03/28/2007 11:19:54  T:  03/28/2007 16:19:28  Job #:  [id number]   cc:   Rosalita Chessman, R.N.

## 2010-12-16 NOTE — Assessment & Plan Note (Signed)
Zachary Holmes is back regarding his multiple pain complaints. He had his left  knee replaced on April 16th by Dr. Turner Daniels and is doing fairly well. He  notes a significant improvement in his pain. He is still having some  problems with range of motion however. He is in physical therapy still.  He still has problems with his right foot which gives him pain with  ambulation and he feels that there is a lot of play. Left shoulder has  also been troublesome more of late. Patient rates his pain at 5 out of  10. Describes it as sharp, burning, constant, aching. Pain increases  with activity as noted above.   REVIEW OF SYSTEMS:  Notable for the above. Full review is in the written  health and history section.   SOCIAL HISTORY:  Patient is single and still smoking.   PHYSICAL EXAMINATION:  Blood pressure is 147/91, pulse is 106,  respiratory rate is 14, he is sating at 96% in room air. Patient is  pleasant, alert and oriented x3. Affect is bright an appropriate. Gait  is  antalgic, more favoring the left leg in fact today. He had a flexion  contracture of the left knee of about 10 degrees. We met a hard  resistance when we got within 10 degrees of full extension. Left knee  itself looked healthy and wound was well-healed. Right foot has some  periarticular swelling but is intact. He has normal motor functions and  sensory exam was within normal limits. Left shoulder is notable for  rotator cuff impingement signs today. He also seems to be tender around  the subdeltoid bursa.  HEART: Regular rhythm but tachycardic still.  CHEST: Clear.  ABDOMEN: Soft, nontender.   ASSESSMENT:  1. History of left tibial plateau fracture and post traumatic      arthritis status post left total knee replacement with good      results. Patient does have a bit of extensor lag still.  2. Right calcaneal fracture with residual ankle and foot pain.  3. History of left rotator cuff injury.  4. Left subdeltoid bursitis.  5. History of depression.  6. History of marijuana use.  7. Hypertension.   PLAN:  1. Continue Avinza 60 mg q. 12 hours.  2. I refilled Cymbalta 60 mg q. day and Restoril 3 mg at bedtime for      sleep.  3. After an informed consent we injected the left shoulder via the      lateral approach today using 40 mg of Kenalog and 3 cc 1%      lidocaine.  4. Continue atenolol for blood pressure. He will need to follow up      with his primary care physician who apparently he has arranged an      appointment with to further address blood pressure issues.  5. I will see the patient back in approximately 2 months' time.     Ranelle Oyster, M.D.  Electronically Signed    ZTS/MedQ  D:  01/03/2007 13:48:19  T:  01/03/2007 16:34:57  Job #:  [id number]   cc:   ___________

## 2010-12-16 NOTE — Assessment & Plan Note (Signed)
Zachary Holmes is back regarding his multiple orthopedic issues.  We weaned him  off Avinza a month or two ago due to a positive urine drug screen for  marijuana.  The patient has done fairly well off the Avinza in the past.  He feels that his pain overall is better, but he feels impaired with his  movement, particularly at the right ankle and left knee.  He feels that  the left knee has a hard time with full range of motion going up the  steps and when he is walking for longer periods of time.  Left shoulder  has been stable.  He rates his pain a 4/10.  Described it as stabbing,  constant, and tingling.  Pain interferes with general activity, in  relationship with others, and enjoyment of life on a mild level.  Sleep  is poor.   REVIEW OF SYSTEMS:  Notable for trouble walking, depression, and  vomiting.  Full review is in the written health and history section.  Other pertinent positives are above.   SOCIAL HISTORY:  Without specific change.  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   PHYSICAL EXAMINATION:  VITAL SIGNS:  Blood pressure is 142/93, pulse is  72, respiratory rate 16, and he is sating 97% on room air.  GENERAL:  The patient is pleasant, alert, and oriented x3.  Affect is  bright and appropriate.  Weight is stable.  EXTREMITIES:  He has still a bit of extension lag.  Left knee flexion is  good at 100-105 degrees.  He is a bit weak with knee extension at a 4-  4+/5 still.  Knee slightly is intact.  Right lower extremity is stable  with some reduced motion at the ankle, but overall improved pain  tolerance and weightbearing.  He is a bit antalgic to the right with  gait and is a bit uneven with his toe walk on the right side due to the  fusion, but it is functional with his gait today.  HEART:  Regular.  CHEST:  Clear.  ABDOMEN:  Soft and  nontender.  Left shoulder is unchanged.   ASSESSMENT:  1. Left tibial plateau fracture and post-traumatic arthritis with left      knee replacement.  2. Right calcaneal fracture and chronic ankle/foot pain.  3. Left rotator cuff injury status post surgical treatment.  4. Depression.  5. Hypertension.  6. Marijuana use.   PLAN:  1. Continue Cymbalta as currently dosed.  2. I increased his atenolol to 100 mg daily.  He needs regular primary      care physician followup for his blood pressure.  3. Agree with custom insoles for [employer].  He also may      benefit from right ankle brace or some sort to go with that.  4. I really do not have much further to offer Zachary Holmes at this point.      I think, he has done fairly well and he has come a long way.  I      think he needs to shift his focus towards vocational issues and      moving on at the current level function he is able to achieve at      this time.  I think, he will have long-term issues with his right  ankle certainly, perhaps his left shoulder as well.  I hope that      left knee will improve further over the next several      months to a year.  5. I will see Zachary Holmes back in 4 months' time.      Ranelle Oyster, M.D.  Electronically Signed     ZTS/MedQ  D:  06/05/2008 12:40:08  T:  06/06/2008 00:09:11  Job #:  [id number]   cc:   Carolee Rota, RN at [facility]

## 2010-12-16 NOTE — Assessment & Plan Note (Signed)
Zachary Holmes is back regarding his multiple pain issues.  He has been  generally stable with his left knee.  Popping and laxity seems to have  improved somewhat after the fall and he is back at baseline.  His pain  is about 5/10.  Pain is sharp, stabbing.  Pain interferes with general  activity, relations with others, enjoyment of life on a mild level.  Sleep is poor at times.  Blood pressure has been elevated.  He has not  been exercising as much and has been gaining some weight.   REVIEW OF SYSTEMS:  Notable for depression, anxiety, trouble walking.  Full review is in the written health and history section.  Other  pertinent positives are above.   SOCIAL HISTORY:  The patient currently lives alone.   PHYSICAL EXAMINATION:  VITAL SIGNS:  Blood pressure is 148/107, pulse  82, respiratory rate 18, he is sating 97% on room air.  GENERAL:  The patient is pleasant, alert, and oriented x3.  Affect is  bright and appropriate.  MUSCULOSKELETAL:  He still has some antalgia in the left knee with  ambulation.  He uses his cane for unloading.  Left knee still lacks  about 5-7 degrees of extension.  He has mild crepitus with flexion.  Patella seems to track well.  Strength is generally 5/5 in the left knee  today really.  Otherwise, strength is in 5/5 range less the left  shoulder.  HEART:  Regular.  CHEST:  Clear.  ABDOMEN:  Soft, nontender.  NEURO:  Cognitively, he is intact.   ASSESSMENT:  1. Left tibial plateau fracture, post-traumatic arthritis status post      left knee replacement.  2. Right calcaneal fracture and chronic ankle/foot pain.  3. Left rotator cuff injury.  4. Depression.  5. Hypertension.   PLAN:  1. Begin lisinopril 10 mg q.a.m. for blood pressure.  Continue with      atenolol at 100 mg q.a.m.  The patient needs a family physician.  I      will not continue prescribing medication for him after 3 months in      regards to his blood pressure.  Again, he needs a family  physician.  2. I will follow up with him routinely in about 6 months for his pain      needs.  Likely, we will not need to see him on a scheduled basis      after that.  3. Recommended regular meals i.e. 3 times a day and exercise to      tolerance.  He seems to do better with aquatic therapy due to lower      impact.  I encouraged walking, swimming as possible, etc.      Ranelle Oyster, M.D.  Electronically Signed     ZTS/MedQ  D:  03/13/2009 10:45:03  T:  03/13/2009 22:00:26  Job #:  [id number]

## 2010-12-16 NOTE — Op Note (Signed)
Zachary Holmes, Zachary Holmes              ACCOUNT NO.:  [id number]   MEDICAL RECORD NO.:  [id number]          PATIENT TYPE:  AMB   LOCATION:  [facility]                          FACILITY:  [facility]   PHYSICIAN:  Feliberto Gottron. Turner Daniels, M.D.   DATE OF BIRTH:  06/13/1961   DATE OF PROCEDURE:  06/22/2007  DATE OF DISCHARGE:  06/22/2007                               OPERATIVE REPORT   PREOPERATIVE DIAGNOSIS:  Left shoulder impingement syndrome,  acromioclavicular joint arthritis, labral tearing and loose bodies with  degenerative arthritis in the left shoulder.   POSTOPERATIVE DIAGNOSIS:  Left shoulder impingement syndrome,  acromioclavicular joint arthritis, labral tearing and loose bodies with  degenerative arthritis in the left shoulder.   PROCEDURE:  Left shoulder arthroscopic anterior-inferior acromioplasty,  formal distal clavicle excision debridement of extensive tearing of the  anterior, superior and posterior labrum and removal of multiple  cartilaginous loose bodies from the glenohumeral joint.   SURGEON:  Feliberto Gottron. Turner Daniels, M.D.   FIRST ASSISTANT:  None.   ANESTHETIC:  Interscalene block left plus general endotracheal.   ESTIMATED BLOOD LOSS:  Minimal.   FLUID REPLACEMENT:  800 mL of crystalloid.   DRAINS PLACED:  None.   TOURNIQUET TIME:  None.   INDICATIONS FOR PROCEDURE:  A 50 year old man with left shoulder  impingement syndrome, AC joint arthritis, cartilaginous loose bodies and  labral tearing exacerbated by crutch use after a total knee arthroplasty  done for Worker's Compensation.  Because of persistent pain that is  refractory to conservative treatment with antiinflammatory medicines,  physical therapy and temporary relief only with cortisone injection, he  desires elective arthroscopic evaluation and treatment of his left  shoulder.  MRI scan showed impingement AC joint arthritis, cartilaginous  loose bodies and an intact rotator cuff.  Risks and benefits of surgery  discussed,  questions answered.   DESCRIPTION OF PROCEDURE:  The patient underwent left shoulder  interscalene block anesthetic in the block area and then was taken to  the operating room at [facility].  Appropriate anesthetic  monitors were attached.  General endotracheal anesthesia induced.  With  the patient in the supine position, he was then placed in the beach-  chair position.  Left upper extremity prepped and draped in the usual  sterile fashion from the wrist to the hemithorax.  The patient received  a gram of Ancef preoperatively and we then made standard portals 1.5 cm  anterior to the [facility] joint, lateral to the junction of the posterior and  middle 1/3 of the acromion, posterior to the posterolateral corner of  the acromion process.  Inflow placed anteriorly with gravity feed for  normal saline.  The scope was placed laterally and a 4.2 [place]  sucker shaver posteriorly.  Diagnostic arthroscopy revealed a fairly  inflamed subacromial bursa which was removed with a sucker shaver.  The  patient had a large type 2 subacromial spur which was removed with a 4.5  hooded Vortex bur taking 2-1/2 full thickness passes.  A fairly large  subclavicular spur was removed revealing bone on bone arthritic changes  of the [facility]  joint and we then performed a formal distal clavicle excision  by swapping portals bringing the scope in posteriorly, the inflow  laterally and the bur anteriorly.  We also thoroughly evaluated the  rotator cuff externally and found no significant tearing.  At this  point, the arthroscope was repositioned into the glenohumeral joint,  bringing the scope in posteriorly where we diagnosed extensive  degenerative tearing of the labrum.  This was debrided back to a stable  margin with a 3.5 gator sucker shaver.  There is also some tearing of  the biceps anchor but it was grossly intact.  The patient had grade 2  chondromalacia of the humeral head and the glenoid.  This was  debrided  as well and multiple cartilaginous loose bodies were removed.  At this  point, the shoulder was irrigated out with normal saline solution.  The  arthroscopic instruments removed and a dressing of Xeroform, 4 x 4  dressing sponges, ABD and paper tape applied followed by a sling.  The  patient was laid supine, awakened and taken to the recovery room without  difficulty.      Feliberto Gottron. Turner Daniels, M.D.  Electronically Signed     FJR/MEDQ  D:  06/22/2007  T:  06/22/2007  Job:  [id number]
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
## 2011-04-28 LAB — BASIC METABOLIC PANEL
BUN: 10
CO2: 28
Chloride: 104
Glucose, Bld: 126 — ABNORMAL HIGH
Potassium: 5.1

## 2011-05-12 LAB — BASIC METABOLIC PANEL
BUN: 8
CO2: 26
Chloride: 106
Glucose, Bld: 116 — ABNORMAL HIGH
Potassium: 4.4
Sodium: 140

## 2011-08-11 ENCOUNTER — Encounter: Payer: Self-pay | Attending: Physical Medicine & Rehabilitation | Admitting: Physical Medicine & Rehabilitation

## 2011-08-11 DIAGNOSIS — M25569 Pain in unspecified knee: Secondary | ICD-10-CM | POA: Insufficient documentation

## 2011-08-11 DIAGNOSIS — R209 Unspecified disturbances of skin sensation: Secondary | ICD-10-CM | POA: Insufficient documentation

## 2011-08-11 DIAGNOSIS — M259 Joint disorder, unspecified: Secondary | ICD-10-CM | POA: Insufficient documentation

## 2011-08-11 DIAGNOSIS — G894 Chronic pain syndrome: Secondary | ICD-10-CM | POA: Insufficient documentation

## 2011-08-11 DIAGNOSIS — M25519 Pain in unspecified shoulder: Secondary | ICD-10-CM | POA: Insufficient documentation

## 2011-08-11 NOTE — Assessment & Plan Note (Signed)
Zachary Holmes is back today after not being in our office since February. Essentially, we clashed at last visit, as he disagree with my stance on marijuana use and narcotics.  I told him I would not prescribe narcotics in our office.  He has had issues regarding an infection in the left knee prosthesis and has had a knee revision and still has persistent pain and lacks range of motion there now.  He is on long-term Cipro for prophylaxis.  It sounds as if he has a potential total shoulder arthroplasty plan as well over the next 1 or 2 months per Dr. Rennis Chris. He is on no pain medications currently.  Pain is 8/10.  The patient states that he is no longer drinking or smoking marijuana.  REVIEW OF SYSTEMS:  Notable for the above items.  He does report some depression, anxiety, numbness and tingling.  Full 12- point review is in the written health and history section of the chart.  SOCIAL HISTORY:  As noted above.  He is nearing mediation potentially regarding this worker's comp case.  PHYSICAL EXAMINATION:  VITAL SIGNS:  Blood pressure is 155/103, pulse 83, respiratory rate is 16 and he is satting 96% on room air. GENERAL:  The patient is pleasant and alert. EXTREMITIES:  He walked for me today and tends to limb quite a bit on the left knee.  He is only able to flex the knee actively to about 40 degrees.  With passive range of motion is able to move up to approximately 50 or 60, but this is quite tender.  Left shoulder is limited with active abduction and rotation.  He can abduct essentially against gravity, but has difficulty.  There is crepitus noted in the shoulder with range of motion.  He has pain in the subacromial space anteriorly and posteriorly as well with palpation. NEUROLOGIC:  Cognitively, he is generally intact. HEART:  Regular. CHEST:  Clear.  ASSESSMENT:  Chronic pain syndrome regarding her left knee and left shoulder.  A urine drug screen was collected today.  I told the  patient last February that he would be nonnarcotic here in this office.  Based on our conversation today, I am not sure that we can resume narcotics. I told him that we would look at potential for narcotic treatment based on his urine drug screen results.  It is my opinion that he will continue to use marijuana in the future despite recommendations otherwise.  The patient tells me he has been clean of any type of drugs or alcohol for some time now; however, I feel his potential for abuse is high.  For now, we will schedule him back here p.r.n. pending his test results.     Zachary Holmes, M.D. Electronically Signed    ZTS/MedQ D:  08/11/2011 12:08:31  T:  08/11/2011 21:51:00  Job #:  [id number]

## 2011-08-21 ENCOUNTER — Ambulatory Visit: Payer: Worker's Compensation | Admitting: Physical Medicine & Rehabilitation

## 2011-09-02 ENCOUNTER — Ambulatory Visit: Payer: Worker's Compensation | Admitting: Physical Medicine & Rehabilitation

## 2014-07-18 DIAGNOSIS — M25512 Pain in left shoulder: Secondary | ICD-10-CM | POA: Diagnosis not present

## 2014-07-18 DIAGNOSIS — I10 Essential (primary) hypertension: Secondary | ICD-10-CM | POA: Diagnosis not present

## 2014-07-18 DIAGNOSIS — E782 Mixed hyperlipidemia: Secondary | ICD-10-CM | POA: Diagnosis not present

## 2014-07-18 DIAGNOSIS — F339 Major depressive disorder, recurrent, unspecified: Secondary | ICD-10-CM | POA: Diagnosis not present

## 2014-07-18 DIAGNOSIS — K219 Gastro-esophageal reflux disease without esophagitis: Secondary | ICD-10-CM | POA: Diagnosis not present

## 2014-07-18 DIAGNOSIS — K649 Unspecified hemorrhoids: Secondary | ICD-10-CM | POA: Diagnosis not present

## 2014-07-19 DIAGNOSIS — D6481 Anemia due to antineoplastic chemotherapy: Secondary | ICD-10-CM | POA: Diagnosis not present

## 2014-07-19 DIAGNOSIS — Z0000 Encounter for general adult medical examination without abnormal findings: Secondary | ICD-10-CM | POA: Diagnosis not present

## 2014-07-19 DIAGNOSIS — I10 Essential (primary) hypertension: Secondary | ICD-10-CM | POA: Diagnosis not present

## 2014-07-19 DIAGNOSIS — Z0001 Encounter for general adult medical examination with abnormal findings: Secondary | ICD-10-CM | POA: Diagnosis not present

## 2014-07-19 DIAGNOSIS — Z125 Encounter for screening for malignant neoplasm of prostate: Secondary | ICD-10-CM | POA: Diagnosis not present

## 2014-08-01 DIAGNOSIS — M19012 Primary osteoarthritis, left shoulder: Secondary | ICD-10-CM | POA: Diagnosis not present

## 2014-08-01 DIAGNOSIS — Z471 Aftercare following joint replacement surgery: Secondary | ICD-10-CM | POA: Diagnosis not present

## 2014-08-01 DIAGNOSIS — Z96659 Presence of unspecified artificial knee joint: Secondary | ICD-10-CM | POA: Diagnosis not present

## 2014-08-10 DIAGNOSIS — M19012 Primary osteoarthritis, left shoulder: Secondary | ICD-10-CM | POA: Diagnosis not present

## 2014-08-15 ENCOUNTER — Other Ambulatory Visit: Payer: Self-pay | Admitting: Orthopedic Surgery

## 2014-08-15 DIAGNOSIS — M25512 Pain in left shoulder: Secondary | ICD-10-CM

## 2014-08-17 ENCOUNTER — Ambulatory Visit
Admission: RE | Admit: 2014-08-17 | Discharge: 2014-08-17 | Disposition: A | Discharge status: Home / Self Care | Payer: Medicare Other | Source: Ambulatory Visit | Attending: Orthopedic Surgery | Admitting: Orthopedic Surgery

## 2014-08-17 DIAGNOSIS — M25512 Pain in left shoulder: Secondary | ICD-10-CM

## 2014-08-17 DIAGNOSIS — M19012 Primary osteoarthritis, left shoulder: Secondary | ICD-10-CM | POA: Diagnosis not present

## 2014-08-22 DIAGNOSIS — M19012 Primary osteoarthritis, left shoulder: Secondary | ICD-10-CM | POA: Diagnosis not present

## 2014-08-28 DIAGNOSIS — F17210 Nicotine dependence, cigarettes, uncomplicated: Secondary | ICD-10-CM | POA: Diagnosis not present

## 2014-08-28 DIAGNOSIS — I10 Essential (primary) hypertension: Secondary | ICD-10-CM | POA: Diagnosis not present

## 2014-08-28 DIAGNOSIS — K649 Unspecified hemorrhoids: Secondary | ICD-10-CM | POA: Diagnosis not present

## 2014-08-28 DIAGNOSIS — E782 Mixed hyperlipidemia: Secondary | ICD-10-CM | POA: Diagnosis not present

## 2014-08-28 DIAGNOSIS — M25512 Pain in left shoulder: Secondary | ICD-10-CM | POA: Diagnosis not present

## 2014-08-28 DIAGNOSIS — F339 Major depressive disorder, recurrent, unspecified: Secondary | ICD-10-CM | POA: Diagnosis not present

## 2014-08-28 DIAGNOSIS — Z0001 Encounter for general adult medical examination with abnormal findings: Secondary | ICD-10-CM | POA: Diagnosis not present

## 2014-08-28 DIAGNOSIS — F411 Generalized anxiety disorder: Secondary | ICD-10-CM | POA: Diagnosis not present

## 2014-08-28 DIAGNOSIS — K219 Gastro-esophageal reflux disease without esophagitis: Secondary | ICD-10-CM | POA: Diagnosis not present

## 2014-08-29 ENCOUNTER — Other Ambulatory Visit: Payer: Self-pay | Admitting: Orthopedic Surgery

## 2014-09-06 ENCOUNTER — Encounter (HOSPITAL_COMMUNITY): Payer: Self-pay | Admitting: Pharmacy Technician

## 2014-09-10 ENCOUNTER — Other Ambulatory Visit (HOSPITAL_COMMUNITY): Payer: Self-pay | Admitting: *Deleted

## 2014-09-10 NOTE — Pre-Procedure Instructions (Addendum)
Zachary Holmes  09/10/2014   Your procedure is scheduled on:  Thursday, September 20, 2014 at 7:30 AM.   Report to [facility] Entrance "A" Admitting Office at 5:30 AM.   Call this number if you have problems the morning of surgery: [phone]               Any questions prior to day of surgery, please call [phone] between 8 & 4 PM.   Remember:   Do not eat food or drink liquids after midnight Wednesday, 09/19/14.   Take these medicines the morning of surgery with A SIP OF WATER: Omeprazole (Prilosec),xanax  If needed, Sertraline (Zoloft)   STOP all herbel meds, nsaids (aleve,naproxen,advil,ibuprofen) 5 days prior to surgery including aspirin, vitamins starting 09/15/14   Do not wear jewelry.  Do not wear lotions, powders, or cologne. You may NOT wear deodorant.  Men may shave face and neck.  Do not bring valuables to the hospital.  [facility] is not responsible                  for any belongings or valuables.               Contacts, dentures or bridgework may not be worn into surgery.  Leave suitcase in the car. After surgery it may be brought to your room.  For patients admitted to the hospital, discharge time is determined by your                treatment team.             Special Instructions: [name] - Preparing for Surgery  Before surgery, you can play an important role.  Because skin is not sterile, your skin needs to be as free of germs as possible.  You can reduce the number of germs on you skin by washing with CHG (chlorahexidine gluconate) soap before surgery.  CHG is an antiseptic cleaner which kills germs and bonds with the skin to continue killing germs even after washing.  Please DO NOT use if you have an allergy to CHG or antibacterial soaps.  If your skin becomes reddened/irritated stop using the CHG and inform your nurse when you arrive at Short Stay.  Do not shave (including legs and underarms) for at least 48 hours prior to the first CHG shower.   You may shave your face.  Please follow these instructions carefully:   1.  Shower with CHG Soap the night before surgery and the                                morning of Surgery.  2.  If you choose to wash your hair, wash your hair first as usual with your       normal shampoo.  3.  After you shampoo, rinse your hair and body thoroughly to remove the                      Shampoo.  4.  Use CHG as you would any other liquid soap.  You can apply chg directly       to the skin and wash gently with scrungie or a clean washcloth.  5.  Apply the CHG Soap to your body ONLY FROM THE NECK DOWN.        Do not use on open wounds or open sores.  Avoid contact with your  eyes, ears, mouth and genitals (private parts).  Wash genitals (private parts) with your normal soap.  6.  Wash thoroughly, paying special attention to the area where your surgery        will be performed.  7.  Thoroughly rinse your body with warm water from the neck down.  8.  DO NOT shower/wash with your normal soap after using and rinsing off       the CHG Soap.  9.  Pat yourself dry with a clean towel.            10.  Wear clean pajamas.            11.  Place clean sheets on your bed the night of your first shower and do not        sleep with pets.  Day of Surgery  Do not apply any lotions/deodorants the morning of surgery.  Please wear clean clothes to the hospital.     Please read over the following fact sheets that you were given: Pain Booklet, Coughing and Deep Breathing, Blood Transfusion Information, MRSA Information and Surgical Site Infection Prevention

## 2014-09-10 NOTE — Pre-Procedure Instructions (Signed)
Zachary Holmes  09/10/2014   Your procedure is scheduled on:  Thursday, September 20, 2014 at 7:30 AM.   Report to [facility] Entrance "A" Admitting Office at 5:30 AM.   Call this number if you have problems the morning of surgery: [phone]               Any questions prior to day of surgery, please call [phone] between 8 & 4 PM.   Remember:   Do not eat food or drink liquids after midnight Wednesday, 09/19/14.   Take these medicines the morning of surgery with A SIP OF WATER: Omeprazole (Prilosec), Sertraline (Zoloft)   Do not wear jewelry.  Do not wear lotions, powders, or cologne. You may NOT wear deodorant.  Men may shave face and neck.  Do not bring valuables to the hospital.  [facility] is not responsible                  for any belongings or valuables.               Contacts, dentures or bridgework may not be worn into surgery.  Leave suitcase in the car. After surgery it may be brought to your room.  For patients admitted to the hospital, discharge time is determined by your                treatment team.             Special Instructions: [place] - Preparing for Surgery  Before surgery, you can play an important role.  Because skin is not sterile, your skin needs to be as free of germs as possible.  You can reduce the number of germs on you skin by washing with CHG (chlorahexidine gluconate) soap before surgery.  CHG is an antiseptic cleaner which kills germs and bonds with the skin to continue killing germs even after washing.  Please DO NOT use if you have an allergy to CHG or antibacterial soaps.  If your skin becomes reddened/irritated stop using the CHG and inform your nurse when you arrive at Short Stay.  Do not shave (including legs and underarms) for at least 48 hours prior to the first CHG shower.  You may shave your face.  Please follow these instructions carefully:   1.  Shower with CHG Soap the night before surgery and the                                 morning of Surgery.  2.  If you choose to wash your hair, wash your hair first as usual with your       normal shampoo.  3.  After you shampoo, rinse your hair and body thoroughly to remove the                      Shampoo.  4.  Use CHG as you would any other liquid soap.  You can apply chg directly       to the skin and wash gently with scrungie or a clean washcloth.  5.  Apply the CHG Soap to your body ONLY FROM THE NECK DOWN.        Do not use on open wounds or open sores.  Avoid contact with your eyes,       ears, mouth and genitals (private parts).  Wash genitals (private parts)  with your normal soap.  6.  Wash thoroughly, paying special attention to the area where your surgery        will be performed.  7.  Thoroughly rinse your body with warm water from the neck down.  8.  DO NOT shower/wash with your normal soap after using and rinsing off       the CHG Soap.  9.  Pat yourself dry with a clean towel.            10.  Wear clean pajamas.            11.  Place clean sheets on your bed the night of your first shower and do not        sleep with pets.  Day of Surgery  Do not apply any lotions/deodorants the morning of surgery.  Please wear clean clothes to the hospital.     Please read over the following fact sheets that you were given: Pain Booklet, Coughing and Deep Breathing, Blood Transfusion Information, MRSA Information and Surgical Site Infection Prevention

## 2014-09-11 ENCOUNTER — Encounter (HOSPITAL_COMMUNITY): Payer: Self-pay

## 2014-09-11 ENCOUNTER — Encounter (HOSPITAL_COMMUNITY)
Admission: RE | Admit: 2014-09-11 | Discharge: 2014-09-11 | Disposition: A | Discharge status: Home / Self Care | Payer: Medicare Other | Source: Ambulatory Visit | Attending: Orthopedic Surgery | Admitting: Orthopedic Surgery

## 2014-09-11 DIAGNOSIS — Z01812 Encounter for preprocedural laboratory examination: Secondary | ICD-10-CM | POA: Insufficient documentation

## 2014-09-11 DIAGNOSIS — Z0183 Encounter for blood typing: Secondary | ICD-10-CM | POA: Insufficient documentation

## 2014-09-11 DIAGNOSIS — Z01818 Encounter for other preprocedural examination: Secondary | ICD-10-CM | POA: Diagnosis not present

## 2014-09-11 DIAGNOSIS — I10 Essential (primary) hypertension: Secondary | ICD-10-CM | POA: Diagnosis not present

## 2014-09-11 DIAGNOSIS — Z87891 Personal history of nicotine dependence: Secondary | ICD-10-CM | POA: Diagnosis not present

## 2014-09-11 DIAGNOSIS — J42 Unspecified chronic bronchitis: Secondary | ICD-10-CM | POA: Diagnosis not present

## 2014-09-11 DIAGNOSIS — Z720 Tobacco use: Secondary | ICD-10-CM | POA: Diagnosis not present

## 2014-09-11 HISTORY — DX: Essential (primary) hypertension: I10

## 2014-09-11 HISTORY — DX: Major depressive disorder, single episode, unspecified: F32.9

## 2014-09-11 HISTORY — DX: Depression, unspecified: F32.A

## 2014-09-11 HISTORY — DX: Gastro-esophageal reflux disease without esophagitis: K21.9

## 2014-09-11 HISTORY — DX: Unspecified osteoarthritis, unspecified site: M19.90

## 2014-09-11 LAB — CBC WITH DIFFERENTIAL/PLATELET
Basophils Absolute: 0.1 10*3/uL (ref 0.0–0.1)
Basophils Relative: 1 % (ref 0–1)
EOS ABS: 0.2 10*3/uL (ref 0.0–0.7)
Eosinophils Relative: 2 % (ref 0–5)
HCT: 51.7 % (ref 39.0–52.0)
Hemoglobin: 17.3 g/dL — ABNORMAL HIGH (ref 13.0–17.0)
LYMPHS ABS: 1.3 10*3/uL (ref 0.7–4.0)
Lymphocytes Relative: 15 % (ref 12–46)
MCH: 32 pg (ref 26.0–34.0)
MCHC: 33.5 g/dL (ref 30.0–36.0)
MCV: 95.7 fL (ref 78.0–100.0)
MONOS PCT: 7 % (ref 3–12)
Monocytes Absolute: 0.6 10*3/uL (ref 0.1–1.0)
Neutro Abs: 6.3 10*3/uL (ref 1.7–7.7)
Neutrophils Relative %: 75 % (ref 43–77)
PLATELETS: 245 10*3/uL (ref 150–400)
RBC: 5.4 MIL/uL (ref 4.22–5.81)
RDW: 15 % (ref 11.5–15.5)
WBC: 8.3 10*3/uL (ref 4.0–10.5)

## 2014-09-11 LAB — COMPREHENSIVE METABOLIC PANEL
ALBUMIN: 3.9 g/dL (ref 3.5–5.2)
ALT: 25 U/L (ref 0–53)
AST: 30 U/L (ref 0–37)
Alkaline Phosphatase: 87 U/L (ref 39–117)
Anion gap: 7 (ref 5–15)
BUN: 8 mg/dL (ref 6–23)
CALCIUM: 9.3 mg/dL (ref 8.4–10.5)
CHLORIDE: 107 mmol/L (ref 96–112)
CO2: 25 mmol/L (ref 19–32)
Creatinine, Ser: 1.06 mg/dL (ref 0.50–1.35)
GFR calc Af Amer: >90 mL/min (ref >90)
GFR calc non Af Amer: 80 mL/min — ABNORMAL LOW (ref >90)
Glucose, Bld: 124 mg/dL — ABNORMAL HIGH (ref 70–99)
Potassium: 4.4 mmol/L (ref 3.5–5.1)
Sodium: 139 mmol/L (ref 135–145)
Total Bilirubin: 0.7 mg/dL (ref 0.3–1.2)
Total Protein: 6.7 g/dL (ref 6.0–8.3)

## 2014-09-11 LAB — URINALYSIS, ROUTINE W REFLEX MICROSCOPIC
Bilirubin Urine: NEGATIVE
GLUCOSE, UA: NEGATIVE mg/dL
Hgb urine dipstick: NEGATIVE
Ketones, ur: NEGATIVE mg/dL
LEUKOCYTES UA: NEGATIVE
NITRITE: NEGATIVE
PH: 5.5 (ref 5.0–8.0)
Protein, ur: NEGATIVE mg/dL
SPECIFIC GRAVITY, URINE: 1.016 (ref 1.005–1.030)
Urobilinogen, UA: 0.2 mg/dL (ref 0.0–1.0)

## 2014-09-11 LAB — PROTIME-INR
INR: 1.04 (ref 0.00–1.49)
Prothrombin Time: 13.7 seconds (ref 11.6–15.2)

## 2014-09-11 LAB — TYPE AND SCREEN
ABO/RH(D): AB NEG
Antibody Screen: NEGATIVE

## 2014-09-11 LAB — SURGICAL PCR SCREEN
MRSA, PCR: NEGATIVE
STAPHYLOCOCCUS AUREUS: NEGATIVE

## 2014-09-11 LAB — APTT: aPTT: 31 seconds (ref 24–37)

## 2014-09-11 NOTE — Progress Notes (Signed)
   09/11/14 0918  OBSTRUCTIVE SLEEP APNEA  Have you ever been diagnosed with sleep apnea through a sleep study? No  Do you snore loudly (loud enough to be heard through closed doors)?  0  Do you often feel tired, fatigued, or sleepy during the daytime? 0  Has anyone observed you stop breathing during your sleep? 0  Do you have, or are you being treated for high blood pressure? 1  BMI more than 35 kg/m2? 0  Age over 52 years old? 1  Neck circumference greater than 40 cm/16 inches? 1 (18.5)  Gender: 1  Obstructive Sleep Apnea Score 4  Score 4 or greater  Results sent to PCP

## 2014-09-13 ENCOUNTER — Ambulatory Visit: Payer: Self-pay | Admitting: Gastroenterology

## 2014-09-13 DIAGNOSIS — K5730 Diverticulosis of large intestine without perforation or abscess without bleeding: Secondary | ICD-10-CM | POA: Diagnosis not present

## 2014-09-13 DIAGNOSIS — D123 Benign neoplasm of transverse colon: Secondary | ICD-10-CM | POA: Diagnosis not present

## 2014-09-13 DIAGNOSIS — D122 Benign neoplasm of ascending colon: Secondary | ICD-10-CM | POA: Diagnosis not present

## 2014-09-13 DIAGNOSIS — Z1211 Encounter for screening for malignant neoplasm of colon: Secondary | ICD-10-CM | POA: Diagnosis not present

## 2014-09-13 DIAGNOSIS — D124 Benign neoplasm of descending colon: Secondary | ICD-10-CM | POA: Diagnosis not present

## 2014-09-13 DIAGNOSIS — D125 Benign neoplasm of sigmoid colon: Secondary | ICD-10-CM | POA: Diagnosis not present

## 2014-09-13 DIAGNOSIS — K648 Other hemorrhoids: Secondary | ICD-10-CM | POA: Diagnosis not present

## 2014-09-19 MED ORDER — CEFAZOLIN SODIUM-DEXTROSE 2-3 GM-% IV SOLR
2.0000 g | INTRAVENOUS | Status: AC
  Administered 2014-09-20: 2 g via INTRAVENOUS
  Filled 2014-09-19: qty 50

## 2014-09-20 ENCOUNTER — Encounter (HOSPITAL_COMMUNITY)
Admission: RE | Disposition: A | Discharge status: Home / Self Care | Payer: Self-pay | Source: Ambulatory Visit | Attending: Orthopedic Surgery

## 2014-09-20 ENCOUNTER — Inpatient Hospital Stay (HOSPITAL_COMMUNITY): Payer: Medicare Other | Admitting: Anesthesiology

## 2014-09-20 ENCOUNTER — Inpatient Hospital Stay (HOSPITAL_COMMUNITY): Payer: Medicare Other

## 2014-09-20 ENCOUNTER — Inpatient Hospital Stay (HOSPITAL_COMMUNITY)
Admission: RE | Admit: 2014-09-20 | Discharge: 2014-09-21 | DRG: 483 | Disposition: A | Discharge status: Home / Self Care | Payer: Medicare Other | Source: Ambulatory Visit | Attending: Orthopedic Surgery | Admitting: Orthopedic Surgery

## 2014-09-20 ENCOUNTER — Encounter (HOSPITAL_COMMUNITY): Payer: Self-pay | Admitting: *Deleted

## 2014-09-20 DIAGNOSIS — F329 Major depressive disorder, single episode, unspecified: Secondary | ICD-10-CM | POA: Diagnosis not present

## 2014-09-20 DIAGNOSIS — F17210 Nicotine dependence, cigarettes, uncomplicated: Secondary | ICD-10-CM | POA: Diagnosis present

## 2014-09-20 DIAGNOSIS — Z96652 Presence of left artificial knee joint: Secondary | ICD-10-CM | POA: Diagnosis present

## 2014-09-20 DIAGNOSIS — F1290 Cannabis use, unspecified, uncomplicated: Secondary | ICD-10-CM | POA: Diagnosis present

## 2014-09-20 DIAGNOSIS — F419 Anxiety disorder, unspecified: Secondary | ICD-10-CM | POA: Diagnosis present

## 2014-09-20 DIAGNOSIS — Z471 Aftercare following joint replacement surgery: Secondary | ICD-10-CM | POA: Diagnosis not present

## 2014-09-20 DIAGNOSIS — K219 Gastro-esophageal reflux disease without esophagitis: Secondary | ICD-10-CM | POA: Diagnosis not present

## 2014-09-20 DIAGNOSIS — M19012 Primary osteoarthritis, left shoulder: Principal | ICD-10-CM | POA: Diagnosis present

## 2014-09-20 DIAGNOSIS — M1990 Unspecified osteoarthritis, unspecified site: Secondary | ICD-10-CM | POA: Diagnosis not present

## 2014-09-20 DIAGNOSIS — Z96619 Presence of unspecified artificial shoulder joint: Secondary | ICD-10-CM

## 2014-09-20 DIAGNOSIS — I10 Essential (primary) hypertension: Secondary | ICD-10-CM | POA: Diagnosis not present

## 2014-09-20 DIAGNOSIS — Z96612 Presence of left artificial shoulder joint: Secondary | ICD-10-CM

## 2014-09-20 DIAGNOSIS — G8918 Other acute postprocedural pain: Secondary | ICD-10-CM | POA: Diagnosis not present

## 2014-09-20 DIAGNOSIS — M25512 Pain in left shoulder: Secondary | ICD-10-CM | POA: Diagnosis present

## 2014-09-20 HISTORY — DX: Anxiety disorder, unspecified: F41.9

## 2014-09-20 HISTORY — PX: TOTAL SHOULDER ARTHROPLASTY: SHX126

## 2014-09-20 LAB — HEMOGLOBIN AND HEMATOCRIT, BLOOD
HCT: 43 % (ref 39.0–52.0)
HEMOGLOBIN: 14.1 g/dL (ref 13.0–17.0)

## 2014-09-20 SURGERY — ARTHROPLASTY, SHOULDER, TOTAL
Anesthesia: General | Site: Shoulder | Laterality: Left

## 2014-09-20 MED ORDER — ROCURONIUM BROMIDE 100 MG/10ML IV SOLN
INTRAVENOUS | Status: DC | PRN
  Administered 2014-09-20: 50 mg via INTRAVENOUS

## 2014-09-20 MED ORDER — SODIUM CHLORIDE 0.9 % IR SOLN
Status: DC | PRN
  Administered 2014-09-20: 1000 mL
  Administered 2014-09-20: 3000 mL

## 2014-09-20 MED ORDER — POVIDONE-IODINE 7.5 % EX SOLN
Freq: Once | CUTANEOUS | Status: DC
  Filled 2014-09-20: qty 118

## 2014-09-20 MED ORDER — PANTOPRAZOLE SODIUM 40 MG PO TBEC
80.0000 mg | DELAYED_RELEASE_TABLET | Freq: Every day | ORAL | Status: DC
  Administered 2014-09-20 – 2014-09-21 (×2): 80 mg via ORAL
  Filled 2014-09-20 (×2): qty 2

## 2014-09-20 MED ORDER — LACTATED RINGERS IV SOLN
INTRAVENOUS | Status: DC | PRN
  Administered 2014-09-20 (×2): via INTRAVENOUS

## 2014-09-20 MED ORDER — ZOLPIDEM TARTRATE 5 MG PO TABS: 5.0000 mg | ORAL_TABLET | Freq: Every evening | ORAL | Status: DC | PRN

## 2014-09-20 MED ORDER — ASPIRIN EC 325 MG PO TBEC
325.0000 mg | DELAYED_RELEASE_TABLET | Freq: Two times a day (BID) | ORAL | Status: DC
  Administered 2014-09-20 – 2014-09-21 (×2): 325 mg via ORAL
  Filled 2014-09-20 (×2): qty 1

## 2014-09-20 MED ORDER — GLYCOPYRROLATE 0.2 MG/ML IJ SOLN
INTRAMUSCULAR | Status: DC | PRN
Start: 2014-09-20 — End: 2014-09-20
  Administered 2014-09-20: 0.6 mg via INTRAVENOUS

## 2014-09-20 MED ORDER — CEFAZOLIN SODIUM-DEXTROSE 2-3 GM-% IV SOLR
2.0000 g | Freq: Four times a day (QID) | INTRAVENOUS | Status: AC
  Administered 2014-09-20 – 2014-09-21 (×3): 2 g via INTRAVENOUS
  Filled 2014-09-20 (×3): qty 50

## 2014-09-20 MED ORDER — MIDAZOLAM HCL 2 MG/2ML IJ SOLN: 0.5000 mg | Freq: Once | INTRAMUSCULAR | Status: DC | PRN

## 2014-09-20 MED ORDER — NEOSTIGMINE METHYLSULFATE 10 MG/10ML IV SOLN
INTRAVENOUS | Status: DC | PRN
  Administered 2014-09-20: 4.5 mg via INTRAVENOUS

## 2014-09-20 MED ORDER — PHENYLEPHRINE HCL 10 MG/ML IJ SOLN
10.0000 mg | INTRAVENOUS | Status: DC | PRN
  Administered 2014-09-20: 20 ug/min via INTRAVENOUS

## 2014-09-20 MED ORDER — MENTHOL 3 MG MT LOZG: 1.0000 | LOZENGE | OROMUCOSAL | Status: DC | PRN

## 2014-09-20 MED ORDER — ALUMINUM HYDROXIDE GEL 320 MG/5ML PO SUSP
15.0000 mL | ORAL | Status: DC | PRN
  Filled 2014-09-20: qty 30

## 2014-09-20 MED ORDER — DOCUSATE SODIUM 100 MG PO CAPS: 100.0000 mg | ORAL_CAPSULE | Freq: Three times a day (TID) | ORAL | Status: DC | PRN

## 2014-09-20 MED ORDER — HYDROMORPHONE HCL 1 MG/ML IJ SOLN
0.2500 mg | INTRAMUSCULAR | Status: DC | PRN
  Administered 2014-09-20 (×4): 0.5 mg via INTRAVENOUS

## 2014-09-20 MED ORDER — MEPERIDINE HCL 25 MG/ML IJ SOLN: 6.2500 mg | INTRAMUSCULAR | Status: DC | PRN

## 2014-09-20 MED ORDER — STERILE WATER FOR INJECTION IJ SOLN
INTRAMUSCULAR | Status: AC
  Filled 2014-09-20: qty 10

## 2014-09-20 MED ORDER — METHOCARBAMOL 1000 MG/10ML IJ SOLN
500.0000 mg | Freq: Four times a day (QID) | INTRAVENOUS | Status: DC | PRN
  Filled 2014-09-20: qty 5

## 2014-09-20 MED ORDER — ACETAMINOPHEN 325 MG PO TABS: 650.0000 mg | ORAL_TABLET | Freq: Four times a day (QID) | ORAL | Status: DC | PRN

## 2014-09-20 MED ORDER — SODIUM CHLORIDE 0.9 % IV SOLN
INTRAVENOUS | Status: DC
  Administered 2014-09-20: 22:00:00 via INTRAVENOUS

## 2014-09-20 MED ORDER — HYDROMORPHONE HCL 1 MG/ML IJ SOLN
INTRAMUSCULAR | Status: AC
  Filled 2014-09-20: qty 1

## 2014-09-20 MED ORDER — LIDOCAINE HCL (CARDIAC) 20 MG/ML IV SOLN
INTRAVENOUS | Status: DC | PRN
  Administered 2014-09-20: 20 mg via INTRAVENOUS

## 2014-09-20 MED ORDER — OXYCODONE HCL 5 MG PO TABS
5.0000 mg | ORAL_TABLET | ORAL | Status: DC | PRN
  Administered 2014-09-20 – 2014-09-21 (×6): 10 mg via ORAL
  Filled 2014-09-20 (×6): qty 2

## 2014-09-20 MED ORDER — METOCLOPRAMIDE HCL 10 MG PO TABS: 5.0000 mg | ORAL_TABLET | Freq: Three times a day (TID) | ORAL | Status: DC | PRN

## 2014-09-20 MED ORDER — ONDANSETRON HCL 4 MG/2ML IJ SOLN
INTRAMUSCULAR | Status: DC | PRN
  Administered 2014-09-20: 4 mg via INTRAVENOUS

## 2014-09-20 MED ORDER — BISACODYL 10 MG RE SUPP: 10.0000 mg | Freq: Every day | RECTAL | Status: DC | PRN

## 2014-09-20 MED ORDER — FENTANYL CITRATE 0.05 MG/ML IJ SOLN
INTRAMUSCULAR | Status: AC
  Filled 2014-09-20: qty 5

## 2014-09-20 MED ORDER — MORPHINE SULFATE 2 MG/ML IJ SOLN
INTRAMUSCULAR | Status: AC
  Filled 2014-09-20: qty 1

## 2014-09-20 MED ORDER — PHENYLEPHRINE 40 MCG/ML (10ML) SYRINGE FOR IV PUSH (FOR BLOOD PRESSURE SUPPORT)
PREFILLED_SYRINGE | INTRAVENOUS | Status: AC
  Filled 2014-09-20: qty 10

## 2014-09-20 MED ORDER — METOCLOPRAMIDE HCL 5 MG/ML IJ SOLN: 5.0000 mg | Freq: Three times a day (TID) | INTRAMUSCULAR | Status: DC | PRN

## 2014-09-20 MED ORDER — FLEET ENEMA 7-19 GM/118ML RE ENEM: 1.0000 | ENEMA | Freq: Once | RECTAL | Status: AC | PRN

## 2014-09-20 MED ORDER — SODIUM CHLORIDE 0.9 % IJ SOLN
INTRAMUSCULAR | Status: AC
  Filled 2014-09-20: qty 10

## 2014-09-20 MED ORDER — PROPOFOL 10 MG/ML IV BOLUS
INTRAVENOUS | Status: AC
  Filled 2014-09-20: qty 20

## 2014-09-20 MED ORDER — LIDOCAINE HCL (CARDIAC) 20 MG/ML IV SOLN
INTRAVENOUS | Status: AC
  Filled 2014-09-20: qty 5

## 2014-09-20 MED ORDER — PROMETHAZINE HCL 25 MG/ML IJ SOLN: 6.2500 mg | INTRAMUSCULAR | Status: DC | PRN

## 2014-09-20 MED ORDER — BUPIVACAINE LIPOSOME 1.3 % IJ SUSP
20.0000 mL | INTRAMUSCULAR | Status: DC
  Filled 2014-09-20: qty 20

## 2014-09-20 MED ORDER — ACETAMINOPHEN 650 MG RE SUPP: 650.0000 mg | Freq: Four times a day (QID) | RECTAL | Status: DC | PRN

## 2014-09-20 MED ORDER — ONDANSETRON HCL 4 MG PO TABS: 4.0000 mg | ORAL_TABLET | Freq: Four times a day (QID) | ORAL | Status: DC | PRN

## 2014-09-20 MED ORDER — DOCUSATE SODIUM 100 MG PO CAPS
100.0000 mg | ORAL_CAPSULE | Freq: Two times a day (BID) | ORAL | Status: DC
Start: 2014-09-20 — End: 2014-09-21
  Administered 2014-09-20: 100 mg via ORAL
  Filled 2014-09-20: qty 1

## 2014-09-20 MED ORDER — OXYCODONE-ACETAMINOPHEN 5-325 MG PO TABS: 1.0000 | ORAL_TABLET | ORAL | Status: DC | PRN

## 2014-09-20 MED ORDER — EPHEDRINE SULFATE 50 MG/ML IJ SOLN
INTRAMUSCULAR | Status: AC
  Filled 2014-09-20: qty 1

## 2014-09-20 MED ORDER — LISINOPRIL 20 MG PO TABS
20.0000 mg | ORAL_TABLET | Freq: Every day | ORAL | Status: DC
  Administered 2014-09-20 – 2014-09-21 (×2): 20 mg via ORAL
  Filled 2014-09-20 (×2): qty 1

## 2014-09-20 MED ORDER — DIPHENHYDRAMINE HCL 12.5 MG/5ML PO ELIX: 12.5000 mg | ORAL_SOLUTION | ORAL | Status: DC | PRN

## 2014-09-20 MED ORDER — HEMOSTATIC AGENTS (NO CHARGE) OPTIME
TOPICAL | Status: DC | PRN
  Administered 2014-09-20: 1 via TOPICAL

## 2014-09-20 MED ORDER — ALPRAZOLAM 0.5 MG PO TABS
0.5000 mg | ORAL_TABLET | Freq: Every evening | ORAL | Status: DC | PRN
  Administered 2014-09-21: 0.5 mg via ORAL
  Filled 2014-09-20: qty 1

## 2014-09-20 MED ORDER — MIDAZOLAM HCL 2 MG/2ML IJ SOLN
INTRAMUSCULAR | Status: AC
  Filled 2014-09-20: qty 2

## 2014-09-20 MED ORDER — GLYCOPYRROLATE 0.2 MG/ML IJ SOLN
INTRAMUSCULAR | Status: AC
  Filled 2014-09-20: qty 2

## 2014-09-20 MED ORDER — ONDANSETRON HCL 4 MG/2ML IJ SOLN
INTRAMUSCULAR | Status: AC
  Filled 2014-09-20: qty 2

## 2014-09-20 MED ORDER — POLYETHYLENE GLYCOL 3350 17 G PO PACK: 17.0000 g | PACK | Freq: Every day | ORAL | Status: DC | PRN

## 2014-09-20 MED ORDER — MIDAZOLAM HCL 5 MG/5ML IJ SOLN
INTRAMUSCULAR | Status: DC | PRN
  Administered 2014-09-20 (×2): 1 mg via INTRAVENOUS

## 2014-09-20 MED ORDER — GLYCOPYRROLATE 0.2 MG/ML IJ SOLN
INTRAMUSCULAR | Status: AC
  Filled 2014-09-20: qty 1

## 2014-09-20 MED ORDER — VECURONIUM BROMIDE 10 MG IV SOLR
INTRAVENOUS | Status: AC
  Filled 2014-09-20: qty 10

## 2014-09-20 MED ORDER — METHOCARBAMOL 500 MG PO TABS
500.0000 mg | ORAL_TABLET | Freq: Four times a day (QID) | ORAL | Status: DC | PRN
  Administered 2014-09-20 – 2014-09-21 (×3): 500 mg via ORAL
  Filled 2014-09-20 (×3): qty 1

## 2014-09-20 MED ORDER — MORPHINE SULFATE 2 MG/ML IJ SOLN
1.0000 mg | INTRAMUSCULAR | Status: DC | PRN
  Administered 2014-09-20 – 2014-09-21 (×7): 1 mg via INTRAVENOUS
  Filled 2014-09-20 (×6): qty 1

## 2014-09-20 MED ORDER — SERTRALINE HCL 50 MG PO TABS
50.0000 mg | ORAL_TABLET | Freq: Every day | ORAL | Status: DC
  Administered 2014-09-20 – 2014-09-21 (×2): 50 mg via ORAL
  Filled 2014-09-20 (×2): qty 1

## 2014-09-20 MED ORDER — ROCURONIUM BROMIDE 50 MG/5ML IV SOLN
INTRAVENOUS | Status: AC
  Filled 2014-09-20: qty 1

## 2014-09-20 MED ORDER — OXYCODONE-ACETAMINOPHEN 5-325 MG PO TABS
1.0000 | ORAL_TABLET | ORAL | Status: DC | PRN
  Administered 2014-09-20 – 2014-09-21 (×3): 2 via ORAL
  Administered 2014-09-21: 1 via ORAL
  Filled 2014-09-20 (×3): qty 2
  Filled 2014-09-20: qty 1

## 2014-09-20 MED ORDER — ONDANSETRON HCL 4 MG/2ML IJ SOLN: 4.0000 mg | Freq: Four times a day (QID) | INTRAMUSCULAR | Status: DC | PRN

## 2014-09-20 MED ORDER — PHENOL 1.4 % MT LIQD: 1.0000 | OROMUCOSAL | Status: DC | PRN

## 2014-09-20 MED ORDER — THROMBIN 5000 UNITS EX SOLR
CUTANEOUS | Status: AC
  Filled 2014-09-20: qty 5000

## 2014-09-20 MED ORDER — PROPOFOL 10 MG/ML IV BOLUS
INTRAVENOUS | Status: DC | PRN
  Administered 2014-09-20: 200 mg via INTRAVENOUS

## 2014-09-20 MED ORDER — BUPIVACAINE-EPINEPHRINE (PF) 0.5% -1:200000 IJ SOLN
INTRAMUSCULAR | Status: DC | PRN
  Administered 2014-09-20: 30 mL via PERINEURAL

## 2014-09-20 MED ORDER — SUCCINYLCHOLINE CHLORIDE 20 MG/ML IJ SOLN
INTRAMUSCULAR | Status: AC
  Filled 2014-09-20: qty 1

## 2014-09-20 MED ORDER — FENTANYL CITRATE 0.05 MG/ML IJ SOLN
INTRAMUSCULAR | Status: DC | PRN
  Administered 2014-09-20 (×2): 50 ug via INTRAVENOUS
  Administered 2014-09-20: 150 ug via INTRAVENOUS

## 2014-09-20 MED ORDER — SERTRALINE HCL 25 MG PO TABS: 25.0000 mg | ORAL_TABLET | Freq: Every day | ORAL | Status: DC

## 2014-09-20 MED ORDER — VECURONIUM BROMIDE 10 MG IV SOLR
INTRAVENOUS | Status: DC | PRN
  Administered 2014-09-20: 3 mg via INTRAVENOUS

## 2014-09-20 SURGICAL SUPPLY — 69 items
ALIGNEMENT PIN-SMOOTH ×2 IMPLANT
BIT DRILL 5/64X5 DISP (BIT) ×2 IMPLANT
BLADE SAW SAG 73X25 THK (BLADE) ×2
BLADE SAW SGTL 73X25 THK (BLADE) ×1 IMPLANT
BLADE SURG 15 STRL LF DISP TIS (BLADE) ×1 IMPLANT
BLADE SURG 15 STRL SS (BLADE) ×3
CAP SHOULDER TOTAL 2 ×2 IMPLANT
CEMENT BONE DEPUY (Cement) ×2 IMPLANT
CHLORAPREP W/TINT 26ML (MISCELLANEOUS) ×5 IMPLANT
CLOSURE WOUND 1/2 X4 (GAUZE/BANDAGES/DRESSINGS) ×3
COVER MAYO STAND STRL (DRAPES) ×1 IMPLANT
COVER SURGICAL LIGHT HANDLE (MISCELLANEOUS) ×3 IMPLANT
DRAPE IMP U-DRAPE 54X76 (DRAPES) ×3 IMPLANT
DRAPE INCISE IOBAN 66X45 STRL (DRAPES) ×4 IMPLANT
DRAPE ORTHO SPLIT 77X108 STRL (DRAPES) ×6
DRAPE SURG 17X23 STRL (DRAPES) ×3 IMPLANT
DRAPE SURG ORHT 6 SPLT 77X108 (DRAPES) ×2 IMPLANT
DRAPE U-SHAPE 47X51 STRL (DRAPES) ×3 IMPLANT
DRSG AQUACEL AG ADV 3.5X10 (GAUZE/BANDAGES/DRESSINGS) ×2 IMPLANT
ELECT BLADE 4.0 EZ CLEAN MEGAD (MISCELLANEOUS) ×3
ELECT REM PT RETURN 9FT ADLT (ELECTROSURGICAL) ×3
ELECTRODE BLDE 4.0 EZ CLN MEGD (MISCELLANEOUS) IMPLANT
ELECTRODE REM PT RTRN 9FT ADLT (ELECTROSURGICAL) ×1 IMPLANT
EVACUATOR 1/8 PVC DRAIN (DRAIN) IMPLANT
GLOVE BIO SURGEON STRL SZ7 (GLOVE) ×3 IMPLANT
GLOVE BIO SURGEON STRL SZ7.5 (GLOVE) ×3 IMPLANT
GLOVE BIOGEL PI IND STRL 7.0 (GLOVE) ×1 IMPLANT
GLOVE BIOGEL PI IND STRL 8 (GLOVE) ×1 IMPLANT
GLOVE BIOGEL PI INDICATOR 7.0 (GLOVE) ×2
GLOVE BIOGEL PI INDICATOR 8 (GLOVE) ×2
GOWN STRL REUS W/ TWL LRG LVL3 (GOWN DISPOSABLE) ×1 IMPLANT
GOWN STRL REUS W/ TWL XL LVL3 (GOWN DISPOSABLE) ×1 IMPLANT
GOWN STRL REUS W/TWL LRG LVL3 (GOWN DISPOSABLE) ×3
GOWN STRL REUS W/TWL XL LVL3 (GOWN DISPOSABLE) ×3
HANDPIECE INTERPULSE COAX TIP (DISPOSABLE) ×3
HEMOSTAT SURGICEL 2X14 (HEMOSTASIS) ×1 IMPLANT
HOOD PEEL AWAY FACE SHEILD DIS (HOOD) ×8 IMPLANT
KIT BASIN OR (CUSTOM PROCEDURE TRAY) ×3 IMPLANT
KIT ROOM TURNOVER OR (KITS) ×3 IMPLANT
MANIFOLD NEPTUNE II (INSTRUMENTS) ×3 IMPLANT
NDL HYPO 25GX1X1/2 BEV (NEEDLE) IMPLANT
NDL MAYO TROCAR (NEEDLE) ×1 IMPLANT
NEEDLE HYPO 25GX1X1/2 BEV (NEEDLE) IMPLANT
NEEDLE MAYO TROCAR (NEEDLE) ×3 IMPLANT
NS IRRIG 1000ML POUR BTL (IV SOLUTION) ×3 IMPLANT
PACK SHOULDER (CUSTOM PROCEDURE TRAY) ×3 IMPLANT
PAD ARMBOARD 7.5X6 YLW CONV (MISCELLANEOUS) ×6 IMPLANT
RETRIEVER SUT HEWSON (MISCELLANEOUS) ×3 IMPLANT
SET HNDPC FAN SPRY TIP SCT (DISPOSABLE) ×1 IMPLANT
SLING ARM IMMOBILIZER LRG (SOFTGOODS) ×3 IMPLANT
SLING ARM IMMOBILIZER MED (SOFTGOODS) IMPLANT
SMARTMIX MINI TOWER (MISCELLANEOUS) ×3
SPONGE LAP 18X18 X RAY DECT (DISPOSABLE) ×1 IMPLANT
SPONGE LAP 4X18 X RAY DECT (DISPOSABLE) ×2 IMPLANT
STRIP CLOSURE SKIN 1/2X4 (GAUZE/BANDAGES/DRESSINGS) ×4 IMPLANT
SUCTION FRAZIER TIP 10 FR DISP (SUCTIONS) ×3 IMPLANT
SUPPORT WRAP ARM LG (MISCELLANEOUS) ×3 IMPLANT
SUT ETHIBOND NAB CT1 #1 30IN (SUTURE) ×3 IMPLANT
SUT MNCRL AB 4-0 PS2 18 (SUTURE) ×3 IMPLANT
SUT SILK 2 0 TIES 17X18 (SUTURE)
SUT SILK 2-0 18XBRD TIE BLK (SUTURE) IMPLANT
SUT VIC AB 0 CTB1 27 (SUTURE) IMPLANT
SUT VIC AB 2-0 CT1 27 (SUTURE) ×15
SUT VIC AB 2-0 CT1 TAPERPNT 27 (SUTURE) ×1 IMPLANT
SYR CONTROL 10ML LL (SYRINGE) IMPLANT
TAPE FIBER 2MM 7IN #2 BLUE (SUTURE) ×9 IMPLANT
TOWEL OR 17X24 6PK STRL BLUE (TOWEL DISPOSABLE) ×3 IMPLANT
TOWEL OR 17X26 10 PK STRL BLUE (TOWEL DISPOSABLE) ×3 IMPLANT
TOWER SMARTMIX MINI (MISCELLANEOUS) ×1 IMPLANT

## 2014-09-20 NOTE — Transfer of Care (Signed)
Immediate Anesthesia Transfer of Care Note  Patient: Zachary Holmes  Procedure(s) Performed: Procedure(s) with comments: TOTAL SHOULDER ARTHROPLASTY (Left) - Left total shoulder arthroplasty  Patient Location: PACU  Anesthesia Type:General  Level of Consciousness: awake, alert , oriented and patient cooperative  Airway & Oxygen Therapy: Patient Spontanous Breathing and Patient connected to nasal cannula oxygen  Post-op Assessment: Report given to RN and Post -op Vital signs reviewed and stable  Post vital signs: Reviewed  Last Vitals:  Filed Vitals:   09/20/14 0606  BP: 150/98  Pulse: 76  Temp: 37.4 C  Resp: 18    Complications: No apparent anesthesia complications

## 2014-09-20 NOTE — Discharge Instructions (Signed)

## 2014-09-20 NOTE — Anesthesia Preprocedure Evaluation (Addendum)
Anesthesia Evaluation  Patient identified by MRN, date of birth, ID band Patient awake    Reviewed: Allergy & Precautions, NPO status , Patient's Chart, lab work & pertinent test results  History of Anesthesia Complications Negative for: history of anesthetic complications  Airway Mallampati: II  TM Distance: >3 FB Neck ROM: Full    Dental  (+) Partial Upper, Dental Advisory Given   Pulmonary Current Smoker,  breath sounds clear to auscultation        Cardiovascular hypertension, Pt. on medications - anginaRhythm:Regular Rate:Normal     Neuro/Psych Depression negative neurological ROS     GI/Hepatic Neg liver ROS, GERD-  Medicated and Controlled,  Endo/Other  Morbid obesity  Renal/GU negative Renal ROS     Musculoskeletal  (+) Arthritis -,   Abdominal (+) + obese,   Peds  Hematology   Anesthesia Other Findings   Reproductive/Obstetrics negative OB ROS                           Anesthesia Physical Anesthesia Plan  ASA: III  Anesthesia Plan: General   Post-op Pain Management:    Induction: Intravenous  Airway Management Planned: Oral ETT  Additional Equipment:   Intra-op Plan:   Post-operative Plan: Extubation in OR  Informed Consent: I have reviewed the patients History and Physical, chart, labs and discussed the procedure including the risks, benefits and alternatives for the proposed anesthesia with the patient or authorized representative who has indicated his/her understanding and acceptance.   Dental advisory given  Plan Discussed with: CRNA and Surgeon  Anesthesia Plan Comments: (Plan routine monitors, GETA with interscalene block for post op analgesia)        Anesthesia Quick Evaluation

## 2014-09-20 NOTE — Progress Notes (Signed)
Utilization review completed.  

## 2014-09-20 NOTE — H&P (Signed)
Zachary Holmes is an 54 y.o. male.   Chief Complaint: L shoulder pain and dysfunction HPI: 54 year old male with a long history of right shoulder pain and dysfunction. He has end-stage arthritis and has failed extensive conservative management with multiple injections, anti-inflammatories, activity modification.  Past Medical History  Diagnosis Date  . Hypertension   . Depression   . GERD (gastroesophageal reflux disease)   . Arthritis     Past Surgical History  Procedure Laterality Date  . Joint replacement Left     knee  . Knee arthrocentesis Left     numerous   . Foot fracture surgery Right     calcanous  . Shoulder arthroscopy Left     History reviewed. No pertinent family history. Social History:  reports that he has been smoking Cigarettes.  He has a 5 pack-year smoking history. He does not have any smokeless tobacco history on file. He reports that he uses illicit drugs (Marijuana). He reports that he does not drink alcohol.  Allergies: No Known Allergies  Medications Prior to Admission  Medication Sig Dispense Refill  . ALPRAZolam (XANAX) 0.5 MG tablet Take 0.5 mg by mouth at bedtime as needed for anxiety.    Marland Kitchen lisinopril (PRINIVIL,ZESTRIL) 20 MG tablet Take 20 mg by mouth daily.    Marland Kitchen omeprazole (PRILOSEC) 40 MG capsule Take 40 mg by mouth daily.    . sertraline (ZOLOFT) 50 MG tablet Take 25-50 mg by mouth daily. Take 25mg  by mouth daily for 7 days then increase to 50mg  by mouth daily.      No results found for this or any previous visit (from the past 48 hour(s)). No results found.  Review of Systems  All other systems reviewed and are negative.   Blood pressure 150/98, pulse 76, temperature 99.4 F (37.4 C), temperature source Oral, resp. rate 18, height 6' (1.829 m), weight 113.399 kg (250 lb), SpO2 95 %. Physical Exam  Constitutional: He is oriented to person, place, and time. He appears well-developed and well-nourished.  HENT:  Head: Atraumatic.  Eyes:  EOM are normal.  Cardiovascular: Intact distal pulses.   Respiratory: Effort normal.  Musculoskeletal:  L shoulder pain with limited motion  Neurological: He is alert and oriented to person, place, and time.  Skin: Skin is warm and dry.  Psychiatric: He has a normal mood and affect.     Assessment/Plan Left shoulder endstage osteoarthritis, failed conservative management. B2 glenoid. Plan left total shoulder replacement Risks / benefits of surgery discussed Consent on chart  NPO for OR Preop antibiotics   Zachary Holmes WILLIAM 09/20/2014, 7:12 AM

## 2014-09-20 NOTE — Progress Notes (Signed)
Orthopedic Tech Progress Note Patient Details:  Zachary Holmes [birth year] [id number]  Ortho Devices Type of Ortho Device: Arm sling Ortho Device/Splint Interventions: Ordered   Asia Mellody Memos 09/20/2014, 12:52 PM

## 2014-09-20 NOTE — Anesthesia Procedure Notes (Addendum)
Procedure Name: Intubation Date/Time: 09/20/2014 7:39 AM Performed by: Jenne Campus Pre-anesthesia Checklist: Patient identified, Emergency Drugs available, Suction available, Patient being monitored and Timeout performed Patient Re-evaluated:Patient Re-evaluated prior to inductionOxygen Delivery Method: Circle system utilized Preoxygenation: Pre-oxygenation with 100% oxygen Intubation Type: IV induction Ventilation: Mask ventilation without difficulty and Oral airway inserted - appropriate to patient size Laryngoscope Size: Miller and 3 Grade View: Grade I Tube type: Oral Tube size: 7.5 mm Number of attempts: 1 Airway Equipment and Method: Stylet Placement Confirmation: ETT inserted through vocal cords under direct vision,  positive ETCO2,  CO2 detector and breath sounds checked- equal and bilateral Secured at: 23 cm Tube secured with: Tape Dental Injury: Teeth and Oropharynx as per pre-operative assessment    Anesthesia Regional Block:  Interscalene brachial plexus block  Pre-Anesthetic Checklist: ,, timeout performed, Correct Patient, Correct Site, Correct Laterality, Correct Procedure, Correct Position, site marked, Risks and benefits discussed,  Surgical consent,  Pre-op evaluation,  At surgeon's request and post-op pain management  Laterality: Left and Upper  Prep: chloraprep       Needles:  Injection technique: Single-shot     Needle Length: 4cm 4 cm Needle Gauge: 22 and 22 G    Additional Needles:  Procedures: nerve stimulator Interscalene brachial plexus block  Nerve Stimulator or Paresthesia:  Response: forearm twitch, 0.4 mA, 0.1 ms,   Additional Responses:   Narrative:  Start time: 09/20/2014 7:07 AM End time: 09/20/2014 7:19 AM Injection made incrementally with aspirations every 5 mL.  Performed by: Personally  Anesthesiologist: Seleta Rhymes Oron Westrup  Additional Notes: Pt identified in Holding room.  Monitors applied. Working IV access confirmed.  Sterile prep L neck.  #22ga PNS to forearm twitch at 0.38mA threshold.  30cc 0.5% Bupivacaine with 1:200k epi injected incrementally after negative test dose.  Patient asymptomatic, VSS, no heme aspirated, tolerated well.  Jenita Seashore, MD

## 2014-09-20 NOTE — Op Note (Signed)
Procedure(s): TOTAL SHOULDER ARTHROPLASTY Procedure Note  TZVI ECONOMOU male 54 y.o. 09/20/2014  Procedure(s) and Anesthesia Type:    * LEFT TOTAL SHOULDER ARTHROPLASTY - Choice  Surgeon(s) and Role:    * Nita Sells, MD - Primary   Indications:  54 y.o. male  With endstage left shoulder arthritis. Pain and dysfunction interfered with quality of life and nonoperative treatment with activity modification, NSAIDS and injections failed.     Surgeon: Nita Sells   Assistants: Jeanmarie Hubert PA-C [facility] was present and scrubbed throughout the procedure and was essential in positioning, retraction, exposure, and closure)  Anesthesia: General endotracheal anesthesia with preoperative interscalene block given by the attending anesthesiologist    Procedure Detail  TOTAL SHOULDER ARTHROPLASTY  Findings: Tornier flex anatomic press-fit size 7 stem with a 52 x 23 head, cemented size 60 large Cortiloc glenoid.   A lesser tuberosity osteotomy was performed and repaired at the conclusion of the procedure. Preoperatively he was noted to be extremely stiff with passive range of motion to 0 external rotation 85 for flexion. Postoperatively I was able to bring him out to about 35 external rotation.  Estimated Blood Loss:  200 mL         Drains: None   Blood Given: none          Specimens: none        Complications:  * No complications entered in OR log *         Disposition: PACU - hemodynamically stable.         Condition: stable    Procedure:   The patient was identified in the preoperative holding area where I personally marked the operative extremity after verifying with the patient and consent. He  was taken to the operating room where He was transferred to the   operative table.  The patient received an interscalene block in   the holding area by the attending anesthesiologist.  General anesthesia was induced   in the operating room without  complication.  The patient did receive IV  Ancef prior to the commencement of the procedure.  The patient was   placed in the beach-chair position with the back raised about 30   degrees.  The nonoperative extremity and head and neck were carefully   positioned and padded protecting against neurovascular compromise.  The   left upper extremity was then prepped and draped in the standard sterile   fashion.    The appropriate operative time-out was performed with   Anesthesia, the perioperative staff, as well as myself and we all agreed   that the left side was the correct operative site.  An approximately   12 cm incision was made from the tip of the coracoid to the center point of the   humerus at the level of the axilla.  Dissection was carried down sharply   through subcutaneous tissues and cephalic vein was identified and taken   laterally with the deltoid.  The pectoralis major was taken medially.  The   upper 1 cm of the pectoralis major was released from its attachment on   the humerus.  The clavipectoral fascia was incised just lateral to the   conjoined tendon.  This incision was carried up to but not into the   coracoacromial ligament.  Digital palpation was used to prove   integrity of the axillary nerve which was protected throughout the   procedure.  Musculocutaneous nerve was not palpated in the operative  field.  Conjoined tendon was then retracted gently medially and the   deltoid laterally.  Anterior circumflex humeral vessels were clamped and   coagulated.  The soft tissues overlying the biceps was incised and this   incision was carried across the transverse humeral ligament to the base   of the coracoid.  The biceps was tenodesed to the soft tissue just above   pectoralis major and the remaining portion of the biceps superiorly was   excised.  An osteotomy was performed at the lesser tuberosity and the   subscapularis was freed from the underlying capsule.  Capsule was  then   released all the way down to the 6 o'clock position of the humeral head.   The humeral head was then delivered with simultaneous adduction,   extension and external rotation.  All humeral osteophytes were removed   and the anatomic neck of the humerus was marked and cut free hand at   approximately 25 degrees retroversion within about 3 mm of the cuff   reflection posteriorly.  The head size was estimated to be a 52 medium   offset.  At that point, the humeral head was retracted posteriorly with   a Fukuda retractor and the entire anterior-inferior capsule was excised.   Remaining portion of the capsule was released at the base of the   coracoid.  The remaining biceps anchor and the entire anterior-inferior   labrum was excised.  The posterior labrum was also excised but the   posterior capsule was not released.  The guidepin was placed bicortically with 10 elevated guide.  The reamer was used to ream to concentric bone with punctate bleeding.  This gave an excellent concentric surface.  The center hole was then drilled for an anchor peg glenoid followed by the three peripheral holes and none of the holes   exited the glenoid wall.  I then pulse irrigated these holes and dried   them with Surgicel.  The three peripheral holes were then   pressurized cemented and the anchor peg glenoid was placed and impacted   with an excellent fit.  The glenoid was a 60 large component.  The proximal humerus was then again exposed taking care not to displace the glenoid.    The entry awl was used followed by sounding reamers and then sequentially broached from size 3 to 7. This was then left in place and the calcar planer was used. Trial head was placed with a 52 x 23.  With the trial implantation of the component,  there was approximately 50% posterior translation with immediate snap back to the   anatomic position.  With forward elevation, there was no tendency   towards posterior subluxation.   The  trial was removed and the final implant was prepared on a back table.  The trial was removed and the final implant was prepared on a back table.   Small holes were drilled on both sides of the lesser tuberosity osteotomy, through which 3 Fibertapes were passed. The implant was then placed through the loop of all 3 Fibertapes and impacted with an excellent press-fit. This achieved excellent anatomic reconstruction of the proximal humerus.  The joint was then copiously irrigated with pulse lavage.  The subscapularis and   lesser tuberosity osteotomy were then repaired using the 3 Fibertapes previously passed.   One #1 Ethibond was placed at the rotator interval just above   the lesser tuberosity. Copious irrigation was used. Skin was closed with 2-0 Vicryl  sutures in the deep dermal layer and 4-0 Monocryl in a subcuticular  running fashion.  Sterile dressings were then applied including Aquacel.  The patient was placed in a sling and allowed to awaken from general anesthesia and taken to the recovery room in stable condition.      POSTOPERATIVE PLAN:  Early passive range of motion will be allowed with the goal of 30 degrees external rotation and 130 degrees forward elevation.  No internal rotation at this time.  No active motion of the arm until the lesser tuberosity heals.  The patient will likely be kept in the hospital for 1-2 days and then discharged home.

## 2014-09-20 NOTE — Plan of Care (Signed)
Problem: Consults Goal: Diagnosis - Shoulder Surgery Total Shoulder Arthroplasty Left     

## 2014-09-20 NOTE — Anesthesia Postprocedure Evaluation (Signed)
  Anesthesia Post-op Note  Patient: Zachary Holmes  Procedure(s) Performed: Procedure(s) with comments: TOTAL SHOULDER ARTHROPLASTY (Left) - Left total shoulder arthroplasty  Patient Location: PACU  Anesthesia Type:GA combined with regional for post-op pain  Level of Consciousness: awake, alert , oriented and patient cooperative  Airway and Oxygen Therapy: Patient Spontanous Breathing and Patient connected to nasal cannula oxygen  Post-op Pain: mild  Post-op Assessment: Post-op Vital signs reviewed, Patient's Cardiovascular Status Stable, Respiratory Function Stable, Patent Airway, No signs of Nausea or vomiting and Pain level controlled  Post-op Vital Signs: Reviewed and stable  Last Vitals:  Filed Vitals:   09/20/14 1338  BP: 160/85  Pulse: 74  Temp: 36.7 C  Resp: 16    Complications: No apparent anesthesia complications

## 2014-09-21 LAB — BASIC METABOLIC PANEL
ANION GAP: 3 — AB (ref 5–15)
BUN: 8 mg/dL (ref 6–23)
CALCIUM: 8.2 mg/dL — AB (ref 8.4–10.5)
CO2: 30 mmol/L (ref 19–32)
CREATININE: 0.93 mg/dL (ref 0.50–1.35)
Chloride: 103 mmol/L (ref 96–112)
GFR calc Af Amer: >90 mL/min (ref >90)
GFR calc non Af Amer: >90 mL/min (ref >90)
GLUCOSE: 123 mg/dL — AB (ref 70–99)
Potassium: 4 mmol/L (ref 3.5–5.1)
Sodium: 136 mmol/L (ref 135–145)

## 2014-09-21 LAB — CBC
HEMATOCRIT: 42 % (ref 39.0–52.0)
Hemoglobin: 13.9 g/dL (ref 13.0–17.0)
MCH: 32.1 pg (ref 26.0–34.0)
MCHC: 33.1 g/dL (ref 30.0–36.0)
MCV: 97 fL (ref 78.0–100.0)
Platelets: 187 10*3/uL (ref 150–400)
RBC: 4.33 MIL/uL (ref 4.22–5.81)
RDW: 15 % (ref 11.5–15.5)
WBC: 12.7 10*3/uL — AB (ref 4.0–10.5)

## 2014-09-21 MED ORDER — HYDROMORPHONE HCL 2 MG PO TABS: 1.0000 mg | ORAL_TABLET | ORAL | Status: DC | PRN

## 2014-09-21 NOTE — Progress Notes (Signed)
Occupational Therapy Evaluation Patient Details Name: Zachary Holmes MRN: [id number] DOB: 23-Aug-1960 Today's Date: 09/21/2014    History of Present Illness s/p L TSA   Clinical Impression   Completed all education regarding LUE HEP (See below); compensatory techniques for ADL and precautions. Pt making good progress. Ready to D/C home when medically stable and follow up with Dr. Tamera Punt with future therapy needs.     Follow Up Recommendations  Other (comment) (with Dr. Tamera Punt)    Equipment Recommendations  None recommended by OT    Recommendations for Other Services       Precautions / Restrictions Precautions Precautions: Shoulder Type of Shoulder Precautions: chandler protocol Shoulder Interventions: At all times;Shoulder sling/immobilizer;Off for dressing/bathing/exercises Precaution Booklet Issued: Yes (comment) Precaution Comments: FF to 130; ER to 30 PROM; elbow/wriat hand AROM Required Braces or Orthoses: Sling Restrictions Weight Bearing Restrictions: Yes LUE Weight Bearing: Non weight bearing      Mobility Bed Mobility Overal bed mobility: Modified Independent                Transfers Overall transfer level: Modified independent                    Balance Overall balance assessment: No apparent balance deficits (not formally assessed)                                          ADL Overall ADL's : Needs assistance/impaired     Grooming: Set up   Upper Body Bathing: Set up;Supervision/ safety   Lower Body Bathing: Minimal assistance Lower Body Bathing Details (indicate cue type and reason): difficulty reaching L foot Upper Body Dressing : Minimal assistance;Sitting   Lower Body Dressing: Minimal assistance Lower Body Dressing Details (indicate cue type and reason): difficulty with LLe Toilet Transfer: Modified Independent   Toileting- Clothing Manipulation and Hygiene: Modified independent       Functional  mobility during ADLs: Modified independent General ADL Comments: Educated on compensatory techniques for ADL, positioing for LUE in bed/chair .NWB precautions and sling management. P with difficulty with LB ADL. Issued AE to increase independence and decrease strress on LUE (Medicaid)     Vision     Perception     Praxis      Pertinent Vitals/Pain Pain Assessment: 0-10 Pain Score: 6  Pain Location: L shoulder Pain Descriptors / Indicators: Aching Pain Intervention(s): Limited activity within patient's tolerance;Monitored during session;Repositioned;Ice applied     Hand Dominance Right   Extremity/Trunk Assessment Upper Extremity Assessment Upper Extremity Assessment: LUE deficits/detail LUE Deficits / Details: s/p TSA LUE Coordination: decreased gross motor   Lower Extremity Assessment Lower Extremity Assessment: LLE deficits/detail LLE Deficits / Details: limited Knee ROM @ 30 degrees from previous injury   Cervical / Trunk Assessment Cervical / Trunk Assessment: Normal   Communication Communication Communication: No difficulties   Cognition Arousal/Alertness: Awake/alert Behavior During Therapy: WFL for tasks assessed/performed Overall Cognitive Status: Within Functional Limits for tasks assessed                     General Comments       Exercises Exercises: Shoulder     Shoulder Instructions Shoulder Instructions Donning/doffing shirt without moving shoulder: Modified independent Method for sponge bathing under operated UE: Modified independent Donning/doffing sling/immobilizer: Modified independent Correct positioning of sling/immobilizer: Modified independent ROM for elbow, wrist and digits  of operated UE: Modified independent Sling wearing schedule (on at all times/off for ADL's): Modified independent Proper positioning of operated UE when showering: Modified independent Positioning of UE while sleeping: Modified independent    Home Living  Family/patient expects to be discharged to:: Private residence Living Arrangements: Spouse/significant other Available Help at Discharge: Family;Available PRN/intermittently Type of Home: House Home Access: Level entry     Home Layout: One level     Bathroom Shower/Tub: Occupational psychologist: Handicapped height Bathroom Accessibility: Yes How Accessible: Accessible via wheelchair Home Equipment: [place] - 2 wheels;Shower seat - built in;Grab bars - tub/shower;Hand held shower head          Prior Functioning/Environment Level of Independence: Independent             OT Diagnosis: Generalized weakness;Acute pain   OT Problem List: Decreased strength;Decreased range of motion;Decreased coordination;Decreased knowledge of precautions;Decreased knowledge of use of DME or AE;Impaired UE functional use;Pain   OT Treatment/Interventions:      OT Goals(Current goals can be found in the care plan section) Acute Rehab OT Goals Patient Stated Goal: to use my arm OT Goal Formulation: All assessment and education complete, DC therapy  OT Frequency:     Barriers to D/C:            Co-evaluation              End of Session Nurse Communication: Mobility status;Precautions;Weight bearing status;Other (comment) (ready for D/C)  Activity Tolerance: Patient tolerated treatment well Patient left: in chair;with call bell/phone within reach   Time: 1020-1110 OT Time Calculation (min): 50 min Charges:  OT General Charges $OT Visit: 1 Procedure OT Evaluation $Initial OT Evaluation Tier I: 1 Procedure OT Treatments $Self Care/Home Management : 8-22 mins $Therapeutic Exercise: 8-22 mins G-Codes:    Taleen Prosser,HILLARY 09-27-14, 12:02 PM   [place], OTR/L  [phone] 09/27/2014

## 2014-09-21 NOTE — Progress Notes (Signed)
   PATIENT ID: Zachary Holmes   1 Day Post-Op Procedure(s) (LRB): TOTAL SHOULDER ARTHROPLASTY (Left)  Subjective:In some pain, working to get pain controlled once block wore off. Had trouble getting drainage from incision under control yesterday but pressure dressing was applied with no further problems.   Objective:  Filed Vitals:   09/21/14 0602  BP: 133/67  Pulse: 61  Temp: 97.6 F (36.4 C)  Resp: 16     L shoulder dressing changed today Sling readjusted Wiggles fingers, distally NVI  Labs:   Recent Labs  09/20/14 1912 09/21/14 0640  HGB 14.1 13.9   Recent Labs  09/20/14 1912 09/21/14 0640  WBC  --  12.7*  RBC  --  4.33  HCT 43.0 42.0  PLT  --  187   Recent Labs  09/21/14 0640  NA 136  K 4.0  CL 103  CO2 30  BUN 8  CREATININE 0.93  GLUCOSE 123*  CALCIUM 8.2*    Assessment and Plan: 1 day s/p left TSA OT for PROM 130 FF, 30ER Okay for d/c home today when cleared by PT and pain controlled Percocet for home rx, script in chart FU with Dr. Tamera Punt in 2 weeks  VTE proph: ASA 325mg  BID, SCDs

## 2014-09-21 NOTE — Progress Notes (Signed)
Pt discharged to home with spouse. Prescription given and discharge info reviewed.

## 2014-09-21 NOTE — Progress Notes (Signed)
OT Cancellation Note  Patient Details Name: Zachary Holmes MRN: [id number] DOB: 03/06/1961   Cancelled Treatment:    Reason Eval/Treat Not Completed: Pain limiting ability to participate Attempted to see this am. Pt asking to wait until his next pain meds are due. Will return @ 10:00. [place], OTR/L  [phone] 09/21/2014 09/21/2014, 9:19 AM

## 2014-09-22 ENCOUNTER — Encounter (HOSPITAL_COMMUNITY): Payer: Self-pay | Admitting: Orthopedic Surgery

## 2014-09-24 NOTE — Discharge Summary (Signed)
Patient ID: CRU KRITIKOS MRN: [id number] DOB/AGE: 52-Jul-1962 52 y.o.  Admit date: 09/20/2014 Discharge date: 09/21/2014  Admission Diagnoses:  Active Problems:   Osteoarthritis of left shoulder   Discharge Diagnoses:  Same  Past Medical History  Diagnosis Date  . Hypertension   . Depression   . GERD (gastroesophageal reflux disease)   . Arthritis     "legs" (09/20/2014)  . Anxiety     Surgeries: Procedure(s): TOTAL SHOULDER ARTHROPLASTY on 09/20/2014   Consultants:    Discharged Condition: Improved  Hospital Course: Zachary Holmes is an 54 y.o. male who was admitted 09/20/2014 for operative treatment of left end stage glenohumeral osteoarthritis. Patient has severe unremitting pain that affects sleep, daily activities, and work/hobbies. After pre-op clearance the patient was taken to the operating room on 09/20/2014 and underwent  Procedure(s): TOTAL SHOULDER ARTHROPLASTY.    Patient was given perioperative antibiotics:  Anti-infectives    Start     Dose/Rate Route Frequency Ordered Stop   09/20/14 1415  ceFAZolin (ANCEF) IVPB 2 g/50 mL premix     2 g 100 mL/hr over 30 Minutes Intravenous Every 6 hours 09/20/14 1403 09/21/14 0310   09/20/14 0600  ceFAZolin (ANCEF) IVPB 2 g/50 mL premix     2 g 100 mL/hr over 30 Minutes Intravenous On call to O.R. 09/19/14 1234 09/20/14 0739       Patient was given sequential compression devices, early ambulation, and chemoprophylaxis to prevent DVT.  Patient benefited maximally from hospital stay and there were no complications.    Recent vital signs: No data found.    Recent laboratory studies: No results for input(s): WBC, HGB, HCT, PLT, NA, K, CL, CO2, BUN, CREATININE, GLUCOSE, INR, CALCIUM in the last 72 hours.  Invalid input(s): PT, 2   Discharge Medications:     Medication List    TAKE these medications        ALPRAZolam 0.5 MG tablet  Commonly known as:  XANAX  Take 0.5 mg by mouth at bedtime as needed for  anxiety.     docusate sodium 100 MG capsule  Commonly known as:  COLACE  Take 1 capsule (100 mg total) by mouth 3 (three) times daily as needed.     HYDROmorphone 2 MG tablet  Commonly known as:  DILAUDID  Take 0.5-1 tablets (1-2 mg total) by mouth every 4 (four) hours as needed for severe pain.     lisinopril 20 MG tablet  Commonly known as:  PRINIVIL,ZESTRIL  Take 20 mg by mouth daily.     omeprazole 40 MG capsule  Commonly known as:  PRILOSEC  Take 40 mg by mouth daily.     sertraline 50 MG tablet  Commonly known as:  ZOLOFT  Take 25-50 mg by mouth daily. Take 25mg  by mouth daily for 7 days then increase to 50mg  by mouth daily.        Diagnostic Studies: Dg Chest 2 View  09/11/2014   CLINICAL DATA:  Preoperative exam prior to left shoulder arthroplasty ; history of tobacco use and hypertension  EXAM: CHEST  2 VIEW  COMPARISON:  PA and lateral chest x-ray of April 02, 2010  FINDINGS: The lungs are well-expanded. There is no focal infiltrate. The interstitial markings are coarse centrally but stable. The heart and pulmonary vascularity are normal. The mediastinum is normal in width. There is no pleural effusion. The bony thorax is unremarkable.  IMPRESSION: Stable chronic bronchitic change. There is no active cardiopulmonary disease.   Electronically  Signed   By: David  [place]   On: 09/11/2014 10:16   Dg Shoulder Left Port  09/20/2014   CLINICAL DATA:  History of left shoulder replacement  EXAM: LEFT SHOULDER - 1 VIEW  COMPARISON:  CT scan of the left shoulder August 17, 2014  FINDINGS: The patient has undergone placement of a left shoulder prosthesis. Radiographic positioning of the prosthetic components is good. The interface with the native bone is normal.  IMPRESSION: The patient has undergone left shoulder prosthesis placement without evidence of immediate postprocedure complication.   Electronically Signed   By: David  [place]   On: 09/20/2014 13:43    Disposition: 01-Home or  Self Care      Discharge Instructions    Call MD / Call 911    Complete by:  As directed   If you experience chest pain or shortness of breath, CALL 911 and be transported to the hospital emergency room.  If you develope a fever above 101 F, pus (white drainage) or increased drainage or redness at the wound, or calf pain, call your surgeon's office.     Constipation Prevention    Complete by:  As directed   Drink plenty of fluids.  Prune juice may be helpful.  You may use a stool softener, such as Colace (over the counter) 100 mg twice a day.  Use MiraLax (over the counter) for constipation as needed.     Diet - low sodium heart healthy    Complete by:  As directed      Increase activity slowly as tolerated    Complete by:  As directed            Follow-up Information    Follow up with Nita Sells, MD. Schedule an appointment as soon as possible for a visit in 2 weeks.   Specialty:  Orthopedic Surgery   Contact information:   [place] [address] [phone]        Signed: Grier Holmes 09/24/2014, 1:10 PM

## 2014-10-05 DIAGNOSIS — M19012 Primary osteoarthritis, left shoulder: Secondary | ICD-10-CM | POA: Diagnosis not present

## 2014-10-10 DIAGNOSIS — I10 Essential (primary) hypertension: Secondary | ICD-10-CM | POA: Diagnosis not present

## 2014-10-10 DIAGNOSIS — F17210 Nicotine dependence, cigarettes, uncomplicated: Secondary | ICD-10-CM | POA: Diagnosis not present

## 2014-10-10 DIAGNOSIS — R718 Other abnormality of red blood cells: Secondary | ICD-10-CM | POA: Diagnosis not present

## 2014-10-10 DIAGNOSIS — F339 Major depressive disorder, recurrent, unspecified: Secondary | ICD-10-CM | POA: Diagnosis not present

## 2014-10-10 DIAGNOSIS — Z96612 Presence of left artificial shoulder joint: Secondary | ICD-10-CM | POA: Diagnosis not present

## 2014-11-02 DIAGNOSIS — Z96612 Presence of left artificial shoulder joint: Secondary | ICD-10-CM | POA: Diagnosis not present

## 2014-11-02 DIAGNOSIS — M25512 Pain in left shoulder: Secondary | ICD-10-CM | POA: Diagnosis not present

## 2014-11-05 DIAGNOSIS — M19012 Primary osteoarthritis, left shoulder: Secondary | ICD-10-CM | POA: Diagnosis not present

## 2014-11-26 DIAGNOSIS — Z96612 Presence of left artificial shoulder joint: Secondary | ICD-10-CM | POA: Diagnosis not present

## 2014-11-26 DIAGNOSIS — M25512 Pain in left shoulder: Secondary | ICD-10-CM | POA: Diagnosis not present

## 2014-11-28 DIAGNOSIS — M25512 Pain in left shoulder: Secondary | ICD-10-CM | POA: Diagnosis not present

## 2014-11-28 DIAGNOSIS — Z96612 Presence of left artificial shoulder joint: Secondary | ICD-10-CM | POA: Diagnosis not present

## 2014-12-02 DIAGNOSIS — M25512 Pain in left shoulder: Secondary | ICD-10-CM | POA: Diagnosis not present

## 2014-12-02 DIAGNOSIS — Z96612 Presence of left artificial shoulder joint: Secondary | ICD-10-CM | POA: Diagnosis not present

## 2014-12-04 DIAGNOSIS — Z96612 Presence of left artificial shoulder joint: Secondary | ICD-10-CM | POA: Diagnosis not present

## 2014-12-04 DIAGNOSIS — M25512 Pain in left shoulder: Secondary | ICD-10-CM | POA: Diagnosis not present

## 2014-12-06 DIAGNOSIS — Z96612 Presence of left artificial shoulder joint: Secondary | ICD-10-CM | POA: Diagnosis not present

## 2014-12-06 DIAGNOSIS — M25512 Pain in left shoulder: Secondary | ICD-10-CM | POA: Diagnosis not present

## 2014-12-11 DIAGNOSIS — M25512 Pain in left shoulder: Secondary | ICD-10-CM | POA: Diagnosis not present

## 2014-12-11 DIAGNOSIS — Z96612 Presence of left artificial shoulder joint: Secondary | ICD-10-CM | POA: Diagnosis not present

## 2014-12-17 DIAGNOSIS — Z471 Aftercare following joint replacement surgery: Secondary | ICD-10-CM | POA: Diagnosis not present

## 2014-12-17 DIAGNOSIS — Z96612 Presence of left artificial shoulder joint: Secondary | ICD-10-CM | POA: Diagnosis not present

## 2014-12-17 DIAGNOSIS — M25812 Other specified joint disorders, left shoulder: Secondary | ICD-10-CM | POA: Diagnosis not present

## 2015-01-02 DIAGNOSIS — M25512 Pain in left shoulder: Secondary | ICD-10-CM | POA: Diagnosis not present

## 2015-01-02 DIAGNOSIS — Z96612 Presence of left artificial shoulder joint: Secondary | ICD-10-CM | POA: Diagnosis not present

## 2015-01-08 DIAGNOSIS — M25512 Pain in left shoulder: Secondary | ICD-10-CM | POA: Diagnosis not present

## 2015-01-08 DIAGNOSIS — Z96612 Presence of left artificial shoulder joint: Secondary | ICD-10-CM | POA: Diagnosis not present

## 2015-02-20 DIAGNOSIS — M25562 Pain in left knee: Secondary | ICD-10-CM | POA: Diagnosis not present

## 2015-02-20 DIAGNOSIS — M25571 Pain in right ankle and joints of right foot: Secondary | ICD-10-CM | POA: Diagnosis not present

## 2015-02-20 DIAGNOSIS — Z96659 Presence of unspecified artificial knee joint: Secondary | ICD-10-CM | POA: Diagnosis not present

## 2015-02-27 ENCOUNTER — Ambulatory Visit: Payer: Medicare Other | Attending: Anesthesiology | Admitting: Anesthesiology

## 2015-02-27 ENCOUNTER — Encounter: Payer: Self-pay | Admitting: Anesthesiology

## 2015-02-27 VITALS — BP 119/84 | HR 76 | Temp 98.5°F | Resp 18 | Ht 72.0 in | Wt 240.0 lb

## 2015-02-27 DIAGNOSIS — M7591 Shoulder lesion, unspecified, right shoulder: Secondary | ICD-10-CM | POA: Diagnosis not present

## 2015-02-27 DIAGNOSIS — M1612 Unilateral primary osteoarthritis, left hip: Secondary | ICD-10-CM | POA: Diagnosis not present

## 2015-02-27 DIAGNOSIS — M545 Low back pain, unspecified: Secondary | ICD-10-CM

## 2015-02-27 DIAGNOSIS — M5489 Other dorsalgia: Secondary | ICD-10-CM | POA: Diagnosis not present

## 2015-02-27 DIAGNOSIS — M5416 Radiculopathy, lumbar region: Secondary | ICD-10-CM

## 2015-02-27 DIAGNOSIS — G8929 Other chronic pain: Secondary | ICD-10-CM

## 2015-02-27 DIAGNOSIS — M1712 Unilateral primary osteoarthritis, left knee: Secondary | ICD-10-CM | POA: Diagnosis not present

## 2015-02-27 DIAGNOSIS — M25511 Pain in right shoulder: Secondary | ICD-10-CM | POA: Insufficient documentation

## 2015-02-27 DIAGNOSIS — M19019 Primary osteoarthritis, unspecified shoulder: Secondary | ICD-10-CM

## 2015-02-27 DIAGNOSIS — M129 Arthropathy, unspecified: Secondary | ICD-10-CM | POA: Diagnosis not present

## 2015-02-27 DIAGNOSIS — M25562 Pain in left knee: Secondary | ICD-10-CM | POA: Insufficient documentation

## 2015-02-27 DIAGNOSIS — M5136 Other intervertebral disc degeneration, lumbar region: Secondary | ICD-10-CM | POA: Diagnosis not present

## 2015-02-27 DIAGNOSIS — Z96652 Presence of left artificial knee joint: Secondary | ICD-10-CM | POA: Insufficient documentation

## 2015-02-27 DIAGNOSIS — M51369 Other intervertebral disc degeneration, lumbar region without mention of lumbar back pain or lower extremity pain: Secondary | ICD-10-CM

## 2015-02-27 MED ORDER — MELOXICAM 7.5 MG PO TABS: 7.5000 mg | ORAL_TABLET | Freq: Two times a day (BID) | ORAL | Status: DC

## 2015-02-27 NOTE — Progress Notes (Signed)
Safety precautions to be maintained throughout the outpatient stay will include: orient to surroundings, keep bed in low position, maintain call bell within reach at all times, provide assistance with transfer out of bed and ambulation.  

## 2015-02-27 NOTE — Patient Instructions (Signed)
A prescription for Meloxicam was sent to your pharmacy. Epidural Steroid Injection Patient Information  Description: The epidural space surrounds the nerves as they exit the spinal cord.  In some patients, the nerves can be compressed and inflamed by a bulging disc or a tight spinal canal (spinal stenosis).  By injecting steroids into the epidural space, we can bring irritated nerves into direct contact with a potentially helpful medication.  These steroids act directly on the irritated nerves and can reduce swelling and inflammation which often leads to decreased pain.  Epidural steroids may be injected anywhere along the spine and from the neck to the low back depending upon the location of your pain.   After numbing the skin with local anesthetic (like Novocaine), a small needle is passed into the epidural space slowly.  You may experience a sensation of pressure while this is being done.  The entire block usually last less than 10 minutes.  Conditions which may be treated by epidural steroids:   Low back and leg pain  Neck and arm pain  Spinal stenosis  Post-laminectomy syndrome  Herpes zoster (shingles) pain  Pain from compression fractures  Preparation for the injection:  1. Do not eat any solid food or dairy products within 6 hours of your appointment.  2. You may drink clear liquids up to 2 hours before appointment.  Clear liquids include water, black coffee, juice or soda.  No milk or cream please. 3. You may take your regular medication, including pain medications, with a sip of water before your appointment  Diabetics should hold regular insulin (if taken separately) and take 1/2 normal NPH dos the morning of the procedure.  Carry some sugar containing items with you to your appointment. 4. A driver must accompany you and be prepared to drive you home after your procedure.  5. Bring all your current medications with your. 6. An IV may be inserted and sedation may be given at  the discretion of the physician.   7. A blood pressure cuff, EKG and other monitors will often be applied during the procedure.  Some patients may need to have extra oxygen administered for a short period. 8. You will be asked to provide medical information, including your allergies, prior to the procedure.  We must know immediately if you are taking blood thinners (like Coumadin/Warfarin)  Or if you are allergic to IV iodine contrast (dye). We must know if you could possible be pregnant.  Possible side-effects:  Bleeding from needle site  Infection (rare, may require surgery)  Nerve injury (rare)  Numbness & tingling (temporary)  Difficulty urinating (rare, temporary)  Spinal headache ( a headache worse with upright posture)  Light -headedness (temporary)  Pain at injection site (several days)  Decreased blood pressure (temporary)  Weakness in arm/leg (temporary)  Pressure sensation in back/neck (temporary)  Call if you experience:  Fever/chills associated with headache or increased back/neck pain.  Headache worsened by an upright position.  New onset weakness or numbness of an extremity below the injection site  Hives or difficulty breathing (go to the emergency room)  Inflammation or drainage at the infection site  Severe back/neck pain  Any new symptoms which are concerning to you  Please note:  Although the local anesthetic injected can often make your back or neck feel good for several hours after the injection, the pain will likely return.  It takes 3-7 days for steroids to work in the epidural space.  You may not notice any   pain relief for at least that one week.  If effective, we will often do a series of three injections spaced 3-6 weeks apart to maximally decrease your pain.  After the initial series, we generally will wait several months before considering a repeat injection of the same type.  If you have any questions, please call [phone]  [phone] [facility] 

## 2015-02-27 NOTE — Progress Notes (Signed)
Subjective:    Patient ID: Zachary Holmes, male    DOB: May 21, 1961, 54 y.o.   MRN: [id number] This  patient is a pleasant and delightful 54 year old gentleman who was involved in a major industrial work-related accident in 2006. He sustained multiple injuries involving his left knee his left shoulder and his right ankle and had to have multiple surgeries on each of those areas Following his injury he became depressed and developed anxiety neurosis and this interfered with his recovery Today his subjective pain intensity rating is 70% He indicated that he has been on [employer] for 9 years and has also been in a wheelchair for 2 years His most severe pain is in his left knee followed by his left shoulder and his left hip  Pain medications Patient indicates that at the present time he is not taking any pain medications but that lasts opioid he had was Dilaudid-HP which he took following his shoulder surgery  Current medications Current medications include Zoloft lisinopril and Prilosec  Allergies There are no known allergies  Past medical history  Past medical history is positive for a 10 year history of hypertension which is being treated with lisinopril. Major work-related industrial accident in 2006 associated with depression and anxiety  Past surgical history Past surgical history is positive for surgery to repair left tibial plateau injuries followed by left total knee replacement. His left knee became infected with Serratia and he had to have exploratory surgery and debridement without having to remove the hardware. He also had 3 right ankle surgeries and 2 left shoulder surgeries  Social and economic history Patient indicates that he smokes a half a pack of cigarettes per day and that he has been smoking for 15 years. He used to be a moderate drinker but stopped drinking 9 months ago He has been using marijuana since his injury 10 years ago and currently still uses  it.  Marital history Asian has been divorced for 15 years and he has no children. He has 1 stepdaughter.  Family history His mother is deceased at age 52 from the complications of COPD.  His father is alive and well at 52 He has one brother who is alive and well at 71 He had 3 sisters: 2 alive and well at 30 and 51 respectively and one sister is deceased at age 36 from complications of alpha lung syndrome  Imaging studies Patient has had multiple imaging studies but none of those results are available with him at the present time. He has been told that he has degenerative disease of his lumbar spine   Knee Pain  The incident occurred more than 1 week ago. The incident occurred at work. The injury mechanism was a direct blow. The pain is present in the left knee and right ankle (Patient also sustained injuries to his left shoulder). The quality of the pain is described as aching. The pain is at a severity of 7/10. The pain is moderate. The pain has been intermittent since onset. Associated symptoms include a loss of motion and muscle weakness. Pertinent negatives include no inability to bear weight or loss of sensation. Associated symptoms comments: This patient used to be wheelchair bound but currently he walks with the assistance of a cane.  Shoulder Pain  The pain is present in the left shoulder. This is a chronic problem. The current episode started more than 1 year ago. There has been a history of trauma. The problem occurs constantly. The problem has  been unchanged. The quality of the pain is described as aching. The pain is at a severity of 7/10. The pain is moderate. Associated symptoms include joint locking and stiffness. Pertinent negatives include no fever, inability to bear weight, itching or joint swelling. The symptoms are aggravated by contact and activity. He has tried oral narcotics, OTC ointments and movement for the symptoms. The treatment provided no relief. Family history does  not include gout or rheumatoid arthritis. There is no history of diabetes, gout, osteoarthritis or rheumatoid arthritis.  Hip Pain  The incident occurred more than 1 week ago. The incident occurred at work. The injury mechanism was a direct blow. The pain is present in the left leg. The quality of the pain is described as cramping and aching. The pain is at a severity of 7/10. Associated symptoms include a loss of motion and muscle weakness. Pertinent negatives include no inability to bear weight or loss of sensation. He reports no foreign bodies present. The symptoms are aggravated by movement and weight bearing. He has tried rest, ice, immobilization and heat for the symptoms.  Ankle Pain  The incident occurred more than 1 week ago. The incident occurred at work. The injury mechanism was a direct blow. The pain is present in the right ankle. The quality of the pain is described as cramping and aching. The pain is at a severity of 7/10. The pain is moderate. Associated symptoms include a loss of motion and muscle weakness. Pertinent negatives include no inability to bear weight or loss of sensation. He reports no foreign bodies present. The symptoms are aggravated by movement and weight bearing. He has tried rest, non-weight bearing, immobilization and acetaminophen for the symptoms. The treatment provided no relief.      Review of Systems  Constitutional: Negative for fever.  Musculoskeletal: Positive for stiffness. Negative for gout.  Skin: Negative for itching.       Objective:   Physical Exam  Constitutional: He is oriented to person, place, and time. He appears well-developed and well-nourished.  HENT:  Head: Normocephalic and atraumatic.  Right Ear: External ear normal.  Left Ear: External ear normal.  Nose: Nose normal.  Mouth/Throat: Oropharynx is clear and moist. No oropharyngeal exudate.  Eyes: Conjunctivae and EOM are normal. Pupils are equal, round, and reactive to light. Right  eye exhibits no discharge. Left eye exhibits no discharge. No scleral icterus.  Neck: Normal range of motion. Neck supple. No JVD present. No tracheal deviation present. No thyromegaly present.  Cardiovascular: Normal rate, regular rhythm, normal heart sounds and intact distal pulses.  Exam reveals no gallop and no friction rub.   No murmur heard. Pulmonary/Chest: Effort normal and breath sounds normal. No respiratory distress. He has no wheezes. He has no rales. He exhibits no tenderness.  Abdominal: Soft. Bowel sounds are normal. He exhibits no distension. There is no tenderness. There is no rebound and no guarding.  Genitourinary:  Genitourinary examination deferred  Musculoskeletal: He exhibits no edema.  Musculoskeletal examination showed that there was decreased abduction of the left shoulder and normal abduction of the right shoulder. Straight leg raising test on the right side was normal but on the left side and it was 45. There was grade tenderness over the left hip  Lymphadenopathy:    He has no cervical adenopathy.  Neurological: He is alert and oriented to person, place, and time. He has normal reflexes. He displays normal reflexes. No cranial nerve deficit. He exhibits normal muscle tone. Coordination normal.  Skin: Skin is warm and dry. No rash noted. No erythema. No pallor.  Psychiatric: He has a normal mood and affect. His behavior is normal. Judgment and thought content normal.   vital signs were stable Blood pressure was 119/84 Pulse was 76 bpm Respirations were 18 breaths per minute SPO2 was 100% There were no other positive neurological or musculoskeletal findings        Assessment & Plan:  Assessment 1 chronic low back pain 2 lumbar degenerative disc disease 3 lumbar radiculopathy 4 left knee pain 5 osteoarthritis of the left knee 6 status post left total knee replaced 7 osteoarthritis of the left hip 8 adhesive capsulitis of the right shoulder I9  chronic  right shoulder pain  Plan of management 1 will plan to perform a caudal epidural steroid injection 2 we'll plan a right suprascapular nerve block and a left femoral nerve block and a following visit 3 Will discontinue Dilaudid  4 Will begin the patient on meloxicam 7.5 mg twice a day after meals  Lance Bosch M.D.

## 2015-03-01 ENCOUNTER — Ambulatory Visit: Payer: Medicare Other | Attending: Anesthesiology | Admitting: Anesthesiology

## 2015-03-01 VITALS — BP 116/79 | HR 64 | Temp 98.1°F | Resp 16 | Ht 72.0 in | Wt 240.0 lb

## 2015-03-01 DIAGNOSIS — M5136 Other intervertebral disc degeneration, lumbar region: Secondary | ICD-10-CM | POA: Insufficient documentation

## 2015-03-01 DIAGNOSIS — M5416 Radiculopathy, lumbar region: Secondary | ICD-10-CM | POA: Insufficient documentation

## 2015-03-01 DIAGNOSIS — M25512 Pain in left shoulder: Secondary | ICD-10-CM | POA: Insufficient documentation

## 2015-03-01 DIAGNOSIS — G8929 Other chronic pain: Secondary | ICD-10-CM | POA: Diagnosis not present

## 2015-03-01 DIAGNOSIS — M779 Enthesopathy, unspecified: Secondary | ICD-10-CM | POA: Diagnosis not present

## 2015-03-01 DIAGNOSIS — M545 Low back pain, unspecified: Secondary | ICD-10-CM

## 2015-03-01 DIAGNOSIS — M7501 Adhesive capsulitis of right shoulder: Secondary | ICD-10-CM | POA: Diagnosis not present

## 2015-03-01 DIAGNOSIS — M25511 Pain in right shoulder: Secondary | ICD-10-CM | POA: Diagnosis not present

## 2015-03-01 MED ORDER — TRIAMCINOLONE ACETONIDE 40 MG/ML IJ SUSP
INTRAMUSCULAR | Status: AC
  Administered 2015-03-01: 10:00:00
  Filled 2015-03-01: qty 2

## 2015-03-01 MED ORDER — FENTANYL CITRATE (PF) 100 MCG/2ML IJ SOLN
INTRAMUSCULAR | Status: AC
  Filled 2015-03-01: qty 2

## 2015-03-01 MED ORDER — IOHEXOL 180 MG/ML  SOLN
INTRAMUSCULAR | Status: AC
  Administered 2015-03-01: 10:00:00
  Filled 2015-03-01: qty 20

## 2015-03-01 MED ORDER — MIDAZOLAM HCL 5 MG/5ML IJ SOLN
INTRAMUSCULAR | Status: AC
  Administered 2015-03-01: 2 mg via INTRAVENOUS
  Filled 2015-03-01: qty 5

## 2015-03-01 MED ORDER — LIDOCAINE HCL (PF) 1 % IJ SOLN
INTRAMUSCULAR | Status: AC
  Administered 2015-03-01: 10:00:00
  Filled 2015-03-01: qty 5

## 2015-03-01 MED ORDER — BUPIVACAINE HCL (PF) 0.25 % IJ SOLN
INTRAMUSCULAR | Status: AC
  Administered 2015-03-01: 10:00:00
  Filled 2015-03-01: qty 30

## 2015-03-01 NOTE — Progress Notes (Signed)
   Subjective:    Patient ID: Zachary Holmes, male    DOB: 10-Apr-1961, 54 y.o.   MRN: [id number]  HPI    Review of Systems     Objective:   Physical Exam   Procedure note Caudal epidural steroid injection  Date of procedure 03/01/2015  Preoperative diagnosis 1 chronic low back pain 2 lumbar degenerative disc disease 3 lumbar radiculopathy 4 chronic shoulder pain  5 right adhesive capsulitis  Postoperative diagnosis Same  Procedure 1 caudal epidural steroid injection 2 epidurogram with interpretation  3 fluoroscopic guidance  Surgeon Lance Bosch M.D.  Anesthesia Mac anesthesia administered under my supervision  Form consent was obtained and patient appeared to accept and understand the benefits and risks of this procedure Timeout was performed in coordination with the nursing staff  Description of procedure Patient was taken to the operating room and intravenous sedation and Mac anesthesia was provided under my direction After appropriate sedation the sacral coccygeal area was prepped with Betadine. After adequate drip and the area between the sacral cornua was palpated and infiltrated with 5 cc of 1% lidocaine.  An AP fluoroscopic view of the sacrum was visualized and a 17-gauge two-way needle was inserted into the midline at an angle of 45 through the sacrococcygeal membrane. After making contact with the bone, the needle was withdrawn and readvanced in a horizontal position into the caudal epidural space  Epidurogram study  1 cc of Omnipaque 180 was injected through the needle and epidurogram was visualized in both lateral and AP views. The distribution of the dye was optimal and the L4 L5 S1 nerve roots were visualized. No catheter was used.  Caudal epidural steroid injection Then 10 cc of 0.25% bupivacaine and 80 mg of Kenalog were injected into the caudal epidural space. The needle was removed and adequate adequate hemostasis was established. The  patient tolerated procedure quite well and vital signs were stable.  Patient was taken to recovery room in satisfactory condition where he was observed for approximately 45 minutes and then discharged home in satisfactory condition. We'll follow-up with this patient in 1 week  Lance Bosch M.D.     Assessment & Plan:

## 2015-03-01 NOTE — Patient Instructions (Signed)

## 2015-03-01 NOTE — Progress Notes (Signed)
Safety precautions to be maintained throughout the outpatient stay will include: orient to surroundings, keep bed in low position, maintain call bell within reach at all times, provide assistance with transfer out of bed and ambulation.  

## 2015-03-04 ENCOUNTER — Telehealth: Payer: Self-pay

## 2015-03-04 NOTE — Addendum Note (Signed)
Addended by: Dewayne Shorter on: 03/04/2015 12:04 PM   Modules accepted: Level of Service

## 2015-03-06 ENCOUNTER — Other Ambulatory Visit (HOSPITAL_COMMUNITY): Payer: Self-pay | Admitting: Orthopedic Surgery

## 2015-03-06 DIAGNOSIS — Z96659 Presence of unspecified artificial knee joint: Principal | ICD-10-CM

## 2015-03-06 DIAGNOSIS — T84038A Mechanical loosening of other internal prosthetic joint, initial encounter: Secondary | ICD-10-CM

## 2015-03-08 ENCOUNTER — Ambulatory Visit: Payer: Medicare Other | Attending: Anesthesiology | Admitting: Anesthesiology

## 2015-03-08 ENCOUNTER — Encounter: Payer: Self-pay | Admitting: Anesthesiology

## 2015-03-08 VITALS — BP 148/91 | HR 70 | Temp 98.7°F | Resp 18 | Ht 72.0 in | Wt 240.0 lb

## 2015-03-08 DIAGNOSIS — M51369 Other intervertebral disc degeneration, lumbar region without mention of lumbar back pain or lower extremity pain: Secondary | ICD-10-CM

## 2015-03-08 DIAGNOSIS — M25511 Pain in right shoulder: Secondary | ICD-10-CM | POA: Insufficient documentation

## 2015-03-08 DIAGNOSIS — M1712 Unilateral primary osteoarthritis, left knee: Secondary | ICD-10-CM | POA: Diagnosis not present

## 2015-03-08 DIAGNOSIS — M1612 Unilateral primary osteoarthritis, left hip: Secondary | ICD-10-CM

## 2015-03-08 DIAGNOSIS — M7501 Adhesive capsulitis of right shoulder: Secondary | ICD-10-CM | POA: Diagnosis not present

## 2015-03-08 DIAGNOSIS — M25562 Pain in left knee: Secondary | ICD-10-CM | POA: Diagnosis not present

## 2015-03-08 DIAGNOSIS — M5416 Radiculopathy, lumbar region: Secondary | ICD-10-CM | POA: Insufficient documentation

## 2015-03-08 DIAGNOSIS — M778 Other enthesopathies, not elsewhere classified: Secondary | ICD-10-CM

## 2015-03-08 DIAGNOSIS — M545 Low back pain, unspecified: Secondary | ICD-10-CM

## 2015-03-08 DIAGNOSIS — G8929 Other chronic pain: Secondary | ICD-10-CM

## 2015-03-08 DIAGNOSIS — M7591 Shoulder lesion, unspecified, right shoulder: Secondary | ICD-10-CM | POA: Insufficient documentation

## 2015-03-08 DIAGNOSIS — Z96652 Presence of left artificial knee joint: Secondary | ICD-10-CM | POA: Diagnosis not present

## 2015-03-08 DIAGNOSIS — M5417 Radiculopathy, lumbosacral region: Secondary | ICD-10-CM

## 2015-03-08 DIAGNOSIS — M5136 Other intervertebral disc degeneration, lumbar region: Secondary | ICD-10-CM | POA: Diagnosis not present

## 2015-03-08 DIAGNOSIS — M7581 Other shoulder lesions, right shoulder: Secondary | ICD-10-CM

## 2015-03-08 NOTE — Progress Notes (Signed)
Subjective:    Patient ID: Zachary Holmes, male    DOB: 03-06-1961, 54 y.o.   MRN: [id number] This pleasant gentleman return to the clinic today indicating that he did not obtain very much noticeable relief from the caudal epidural steroid injection. It is interesting to note that he doesn't focus too much on the back pain but now he says that his major pain is in the region of the left anterior superior iliac spine He indicates that his subjective pain intensity rating is 70% He indicates that most of his pain is present when he is weightbearing It is clear that he obtained pain relief from his lumbar pain but that now her major pain is in his left knee and in his left hip area HPI    Review of Systems  Constitutional: Negative.   HENT: Negative.   Respiratory: Negative.   Cardiovascular: Negative.   Gastrointestinal: Negative.   Endocrine: Negative.   Genitourinary: Negative.   Musculoskeletal:       His left knee continues to be very painful and now his pain is focused in the anterior left hip in the region of the left anterior superior iliac spine  Skin: Negative.   Allergic/Immunologic: Negative.   Neurological: Negative.   Hematological: Negative.   Psychiatric/Behavioral: Negative.        Objective:   Physical Exam  Constitutional: He appears well-developed and well-nourished. No distress.  HENT:  Head: Normocephalic and atraumatic.  Right Ear: External ear normal.  Left Ear: External ear normal.  Nose: Nose normal.  Mouth/Throat: Oropharynx is clear and moist.  Eyes: Conjunctivae and EOM are normal. Pupils are equal, round, and reactive to light. Right eye exhibits no discharge. Left eye exhibits no discharge. No scleral icterus.  Neck: Normal range of motion. Neck supple. No JVD present. No tracheal deviation present. No thyromegaly present.  Cardiovascular: Normal rate, regular rhythm, normal heart sounds and intact distal pulses.  Exam reveals no gallop and no  friction rub.   No murmur heard. Vital signs were stable Blood pressure was 148/91 mmHg his pulse is 70 breaths per minute equal and regular His respirations are 18 breaths per minute and there are no adventitious sounds His SPO2 was 100%   Pulmonary/Chest: Effort normal and breath sounds normal. No respiratory distress. He has no wheezes. He has no rales. He exhibits no tenderness.  Abdominal: He exhibits no distension and no mass. There is no tenderness. There is no rebound and no guarding.  Genitourinary:  Genitourinary exam was deferred  Musculoskeletal: He exhibits no edema or tenderness.  Patient continues to have limited range of motion in his left knee and there is a large anterior scar over the left knee There is tenderness just lateral to the left anterior superior iliac spine There are no new positive findings  Lymphadenopathy:    He has no cervical adenopathy.  Neurological: He is alert. He has normal reflexes. He displays normal reflexes. No cranial nerve deficit. He exhibits normal muscle tone. Coordination normal.  Skin: Skin is warm and dry. No rash noted. He is not diaphoretic. No erythema. No pallor.  Psychiatric: He has a normal mood and affect. His behavior is normal. Judgment and thought content normal.  Nursing note and vitals reviewed.         Assessment & Plan:  Assessment 1 chronic low back pain 2 lumbar degenerative disc disease 3 lumbar radiculopathy 4 left knee pain 5 osteoarthritis of the left knee 6 status post  left total knee replaced 7 osteoarthritis of the left hip 8 adhesive capsulitis of the right shoulder I9 chronic right shoulder pain   Plan of management We'll plan to perform a left femoral nerve block and a left lateral femoral cutaneous nerve of thigh block for this patient in 1 week's time We will also plan to discontinue the meloxicam since he is having epigastric discomfort with the drug We'll perform the procedure for him in 1  week   Level to establish patient  Lance Bosch M.D.

## 2015-03-08 NOTE — Patient Instructions (Signed)
Lateral Femoral Cutaneous Nerve Block Patient Information  Description: The lateral femoral cutaneous nerve of the thigh is a purely sensory nerve that can become entrapped or irritated for a number of reasons.  The pain associated with this condition is called meralgia paraesthetica.  Patients affected with this syndrome have burning pain or abnormal sensation along the lateral aspect of the thigh.  The pain can be worsened by prolonged walking, standing, or constrictive garments around the house.   The diagnosis can be confirmed and treatment initiated by blocking the nerve with local anesthetic (like Novocaine).  At times, a steroid solution may be injected at the same time.  The site of injection is through a tiny needle in the left, lower quadrant of the abdomen.   The entire block usually lasts less than 5 minutes.  Conditions which may be treated by lateral femoral cutaneous nerve block:   Meralagia paraesthetica  Preparation for the injection:  1. Do not eat any solid food or dairy products within 6 hours of your appointment.  2. You may drink clear liquids up to 2 hours before appointment.  Clear liquids include water, black coffee, juice or soda. No milk or cream please. 3. You may take your regular medication, including pain medications, with a sip of water before your appointment.  Diabetics should hold regular insulin (if taken separately) and take 1/2 normal NPH dose the morning of the procedure.  Carry some sugar containing items with you to your appointment. 4. A driver must accompany you and be prepared to drive you home after your procedure. 5. Bring all you current medications with you 6. An IV may be inserted and sedation may be given at the discretion of the physician. 7. A blood pressure cuff, EKG and other monitors will often be applied during the procedure.  Some patients may need to have extra oxygen administered for a short period. 8. You will be asked to provide medical  information, including your allergies and medications, prior to the procedure.  We must know immediately if you are taking blood thinners (like Coumadin/Warfarin) or if you allergic to IV iodine contrast (dye)  We must know if you could possible be pregnant.   Possible side-effects:   Bleeding from needle site  Infection (rate, may require surgery)  Nerve injury (rare)  Numbness and Tingling (temporary)  Light-headedness (temporary)  Pain at injection site (several day)  Decreased blood pressure (rare, temporary)  Weakness in leg (temporary)  Call if you experience:  Hives or difficulty breathing (go to the emergency room)  Inflammation or drainage at the injection site(s)  Please note:  Although the local anesthetic injected can often make your leg feel good for several hours after the injection,  The pain may return.  It takes 3-7 days for steroids to work.  You may not notice any pain relief for at least one week.  If effective, we will often do a series of injections spaced 3-6 weeks apart to maximally decrease your pain.  If you have any questions, please cll [phone] [facility]   

## 2015-03-08 NOTE — Progress Notes (Signed)
Safety precautions to be maintained throughout the outpatient stay will include: orient to surroundings, keep bed in low position, maintain call bell within reach at all times, provide assistance with transfer out of bed and ambulation.  

## 2015-03-13 ENCOUNTER — Ambulatory Visit: Payer: Self-pay | Admitting: Anesthesiology

## 2015-03-14 ENCOUNTER — Other Ambulatory Visit: Payer: Self-pay | Admitting: Anesthesiology

## 2015-03-15 ENCOUNTER — Encounter (HOSPITAL_COMMUNITY): Admission: RE | Admit: 2015-03-15 | Payer: Medicare Other | Source: Ambulatory Visit

## 2015-03-15 ENCOUNTER — Encounter (HOSPITAL_COMMUNITY): Payer: Medicare Other

## 2015-03-15 ENCOUNTER — Ambulatory Visit: Payer: Self-pay | Admitting: Anesthesiology

## 2015-03-22 ENCOUNTER — Ambulatory Visit: Payer: Self-pay | Admitting: Anesthesiology

## 2015-03-27 ENCOUNTER — Encounter (HOSPITAL_COMMUNITY)
Admission: RE | Admit: 2015-03-27 | Discharge: 2015-03-27 | Disposition: A | Discharge status: Home / Self Care | Payer: Medicare Other | Source: Ambulatory Visit | Attending: Orthopedic Surgery | Admitting: Orthopedic Surgery

## 2015-03-27 DIAGNOSIS — R948 Abnormal results of function studies of other organs and systems: Secondary | ICD-10-CM | POA: Insufficient documentation

## 2015-03-27 DIAGNOSIS — Z96652 Presence of left artificial knee joint: Secondary | ICD-10-CM | POA: Insufficient documentation

## 2015-03-27 DIAGNOSIS — T84038A Mechanical loosening of other internal prosthetic joint, initial encounter: Secondary | ICD-10-CM

## 2015-03-27 DIAGNOSIS — Z96659 Presence of unspecified artificial knee joint: Secondary | ICD-10-CM

## 2015-03-27 DIAGNOSIS — Z471 Aftercare following joint replacement surgery: Secondary | ICD-10-CM | POA: Diagnosis not present

## 2015-03-27 DIAGNOSIS — M25562 Pain in left knee: Secondary | ICD-10-CM | POA: Insufficient documentation

## 2015-04-12 ENCOUNTER — Encounter: Payer: Self-pay | Admitting: Anesthesiology

## 2015-04-12 ENCOUNTER — Ambulatory Visit: Payer: Medicare Other | Attending: Anesthesiology | Admitting: Anesthesiology

## 2015-04-12 VITALS — BP 133/89 | HR 66 | Temp 98.2°F | Resp 18 | Ht 72.0 in | Wt 235.0 lb

## 2015-04-12 DIAGNOSIS — G8929 Other chronic pain: Secondary | ICD-10-CM | POA: Diagnosis not present

## 2015-04-12 DIAGNOSIS — M5416 Radiculopathy, lumbar region: Secondary | ICD-10-CM | POA: Diagnosis not present

## 2015-04-12 DIAGNOSIS — M545 Low back pain, unspecified: Secondary | ICD-10-CM

## 2015-04-12 DIAGNOSIS — M7581 Other shoulder lesions, right shoulder: Secondary | ICD-10-CM | POA: Diagnosis not present

## 2015-04-12 DIAGNOSIS — M1612 Unilateral primary osteoarthritis, left hip: Secondary | ICD-10-CM | POA: Insufficient documentation

## 2015-04-12 DIAGNOSIS — M19011 Primary osteoarthritis, right shoulder: Secondary | ICD-10-CM

## 2015-04-12 DIAGNOSIS — M5137 Other intervertebral disc degeneration, lumbosacral region: Secondary | ICD-10-CM

## 2015-04-12 DIAGNOSIS — M25562 Pain in left knee: Secondary | ICD-10-CM | POA: Insufficient documentation

## 2015-04-12 DIAGNOSIS — M5136 Other intervertebral disc degeneration, lumbar region: Secondary | ICD-10-CM | POA: Insufficient documentation

## 2015-04-12 DIAGNOSIS — M1712 Unilateral primary osteoarthritis, left knee: Secondary | ICD-10-CM

## 2015-04-12 MED ORDER — BUPIVACAINE HCL (PF) 0.5 % IJ SOLN
INTRAMUSCULAR | Status: AC
  Administered 2015-04-12: 8 mL
  Filled 2015-04-12: qty 30

## 2015-04-12 MED ORDER — LIDOCAINE HCL (PF) 1 % IJ SOLN
INTRAMUSCULAR | Status: AC
  Filled 2015-04-12: qty 5

## 2015-04-12 MED ORDER — TRIAMCINOLONE ACETONIDE 40 MG/ML IJ SUSP
INTRAMUSCULAR | Status: AC
  Administered 2015-04-12: 40 mg
  Filled 2015-04-12: qty 1

## 2015-04-12 MED ORDER — OXYCODONE-ACETAMINOPHEN 5-325 MG PO TABS: 1.0000 | ORAL_TABLET | Freq: Two times a day (BID) | ORAL | Status: DC | PRN

## 2015-04-12 NOTE — Patient Instructions (Addendum)
Smoking Cessation Quitting smoking is important to your health and has many advantages. However, it is not always easy to quit since nicotine is a very addictive drug. Oftentimes, people try 3 times or more before being able to quit. This document explains the best ways for you to prepare to quit smoking. Quitting takes hard work and a lot of effort, but you can do it. ADVANTAGES OF QUITTING SMOKING 1. You will live longer, feel better, and live better. 2. Your body will feel the impact of quitting smoking almost immediately. 1. Within 20 minutes, blood pressure decreases. Your pulse returns to its normal level. 2. After 8 hours, carbon monoxide levels in the blood return to normal. Your oxygen level increases. 3. After 24 hours, the chance of having a heart attack starts to decrease. Your breath, hair, and body stop smelling like smoke. 4. After 48 hours, damaged nerve endings begin to recover. Your sense of taste and smell improve. 5. After 72 hours, the body is virtually free of nicotine. Your bronchial tubes relax and breathing becomes easier. 6. After 2 to 12 weeks, lungs can hold more air. Exercise becomes easier and circulation improves. 3. The risk of having a heart attack, stroke, cancer, or lung disease is greatly reduced. 1. After 1 year, the risk of coronary heart disease is cut in half. 2. After 5 years, the risk of stroke falls to the same as a nonsmoker. 3. After 10 years, the risk of lung cancer is cut in half and the risk of other cancers decreases significantly. 4. After 15 years, the risk of coronary heart disease drops, usually to the level of a nonsmoker. 4. If you are pregnant, quitting smoking will improve your chances of having a healthy baby. 5. The people you live with, especially any children, will be healthier. 6. You will have extra money to spend on things other than cigarettes. QUESTIONS TO THINK ABOUT BEFORE ATTEMPTING TO QUIT You may want to talk about your  answers with your health care provider.  Why do you want to quit?  If you tried to quit in the past, what helped and what did not?  What will be the most difficult situations for you after you quit? How will you plan to handle them?  Who can help you through the tough times? Your family? Friends? A health care provider?  What pleasures do you get from smoking? What ways can you still get pleasure if you quit? Here are some questions to ask your health care provider: 1. How can you help me to be successful at quitting? 2. What medicine do you think would be best for me and how should I take it? 3. What should I do if I need more help? 4. What is smoking withdrawal like? How can I get information on withdrawal? GET READY 1. Set a quit date. 2. Change your environment by getting rid of all cigarettes, ashtrays, matches, and lighters in your home, car, or work. Do not let people smoke in your home. 3. Review your past attempts to quit. Think about what worked and what did not. GET SUPPORT AND ENCOURAGEMENT You have a better chance of being successful if you have help. You can get support in many ways. 1. Tell your family, friends, and coworkers that you are going to quit and need their support. Ask them not to smoke around you. 2. Get individual, group, or telephone counseling and support. Programs are available at [employer] and health centers. Call  your local health department for information about programs in your area. 3. Spiritual beliefs and practices may help some smokers quit. 4. Download a "quit meter" on your computer to keep track of quit statistics, such as how long you have gone without smoking, cigarettes not smoked, and money saved. 5. Get a self-help book about quitting smoking and staying off tobacco. [place] yourself from urges to smoke. Talk to someone, go for a walk, or occupy your time with a task.  Change your normal routine. Take  a different route to work. Drink tea instead of coffee. Eat breakfast in a different place.  Reduce your stress. Take a hot bath, exercise, or read a book.  Plan something enjoyable to do every day. Reward yourself for not smoking.  Explore interactive web-based programs that specialize in helping you quit. GET MEDICINE AND USE IT CORRECTLY Medicines can help you stop smoking and decrease the urge to smoke. Combining medicine with the above behavioral methods and support can greatly increase your chances of successfully quitting smoking.  Nicotine replacement therapy helps deliver nicotine to your body without the negative effects and risks of smoking. Nicotine replacement therapy includes nicotine gum, lozenges, inhalers, nasal sprays, and skin patches. Some may be available over-the-counter and others require a prescription.  Antidepressant medicine helps people abstain from smoking, but how this works is unknown. This medicine is available by prescription.  Nicotinic receptor partial agonist medicine simulates the effect of nicotine in your brain. This medicine is available by prescription. Ask your health care provider for advice about which medicines to use and how to use them based on your health history. Your health care provider will tell you what side effects to look out for if you choose to be on a medicine or therapy. Carefully read the information on the package. Do not use any other product containing nicotine while using a nicotine replacement product.  RELAPSE OR DIFFICULT SITUATIONS Most relapses occur within the first 3 months after quitting. Do not be discouraged if you start smoking again. Remember, most people try several times before finally quitting. You may have symptoms of withdrawal because your body is used to nicotine. You may crave cigarettes, be irritable, feel very hungry, cough often, get headaches, or have difficulty concentrating. The withdrawal symptoms are only  temporary. They are strongest when you first quit, but they will go away within 10-14 days. To reduce the chances of relapse, try to:  Avoid drinking alcohol. Drinking lowers your chances of successfully quitting.  Reduce the amount of caffeine you consume. Once you quit smoking, the amount of caffeine in your body increases and can give you symptoms, such as a rapid heartbeat, sweating, and anxiety.  Avoid smokers because they can make you want to smoke.  Do not let weight gain distract you. Many smokers will gain weight when they quit, usually less than 10 pounds. Eat a healthy diet and stay active. You can always lose the weight gained after you quit.  Find ways to improve your mood other than smoking. FOR MORE INFORMATION  www.smokefree.gov  Document Released: 07/14/2001 Document Revised: 12/04/2013 Document Reviewed: 10/29/2011 [facility] Patient Information 2015 [place], [place]. This information is not intended to replace advice given to you by your health care provider. Make sure you discuss any questions you have with your health care provider. Pain Management Discharge Instructions  General Discharge Instructions :  If you need to reach your doctor call: Monday-Friday 8:00 am -  4:00 pm at [phone] or toll free [phone].  After clinic hours [phone] to have operator reach doctor.  Bring all of your medication bottles to all your appointments in the pain clinic.  To cancel or reschedule your appointment with Pain Management please remember to call 24 hours in advance to avoid a fee.  Refer to the educational materials which you have been given on: General Risks, I had my Procedure. Discharge Instructions, Post Sedation.  Post Procedure Instructions:  The drugs you were given will stay in your system until tomorrow, so for the next 24 hours you should not drive, make any legal decisions or drink any alcoholic beverages.  You may eat anything you prefer, but it is  better to start with liquids then soups and crackers, and gradually work up to solid foods.  Please notify your doctor immediately if you have any unusual bleeding, trouble breathing or pain that is not related to your normal pain.  Depending on the type of procedure that was done, some parts of your body may feel week and/or numb.  This usually clears up by tonight or the next day.  Walk with the use of an assistive device or accompanied by an adult for the 24 hours.  You may use ice on the affected area for the first 24 hours.  Put ice in a Ziploc bag and cover with a towel and place against area 15 minutes on 15 minutes off.  You may switch to heat after 24 hours.Lateral Femoral Cutaneous Nerve Block Patient Information  Description: The lateral femoral cutaneous nerve of the thigh is a purely sensory nerve that can become entrapped or irritated for a number of reasons.  The pain associated with this condition is called meralgia paraesthetica.  Patients affected with this syndrome have burning pain or abnormal sensation along the lateral aspect of the thigh.  The pain can be worsened by prolonged walking, standing, or constrictive garments around the house.   The diagnosis can be confirmed and treatment initiated by blocking the nerve with local anesthetic (like Novocaine).  At times, a steroid solution may be injected at the same time.  The site of injection is through a tiny needle in the left, lower quadrant of the abdomen.   The entire block usually lasts less than 5 minutes.  Conditions which may be treated by lateral femoral cutaneous nerve block:   Meralagia paraesthetica  Preparation for the injection:  5. Do not eat any solid food or dairy products within 6 hours of your appointment.  6. You may drink clear liquids up to 2 hours before appointment.  Clear liquids include water, black coffee, juice or soda. No milk or cream please. 7. You may take your regular medication, including  pain medications, with a sip of water before your appointment.  Diabetics should hold regular insulin (if taken separately) and take 1/2 normal NPH dose the morning of the procedure.  Carry some sugar containing items with you to your appointment. 8. A driver must accompany you and be prepared to drive you home after your procedure. 9. Bring all you current medications with you 10. An IV may be inserted and sedation may be given at the discretion of the physician. 11. A blood pressure cuff, EKG and other monitors will often be applied during the procedure.  Some patients may need to have extra oxygen administered for a short period. 12. You will be asked to provide medical information, including your allergies and medications, prior to  the procedure.  We must know immediately if you are taking blood thinners (like Coumadin/Warfarin) or if you allergic to IV iodine contrast (dye)  We must know if you could possible be pregnant.   Possible side-effects:   Bleeding from needle site  Infection (rate, may require surgery)  Nerve injury (rare)  Numbness and Tingling (temporary)  Light-headedness (temporary)  Pain at injection site (several day)  Decreased blood pressure (rare, temporary)  Weakness in leg (temporary)  Call if you experience:  Hives or difficulty breathing (go to the emergency room)  Inflammation or drainage at the injection site(s)  Please note:  Although the local anesthetic injected can often make your leg feel good for several hours after the injection,  The pain may return.  It takes 3-7 days for steroids to work.  You may not notice any pain relief for at least one week.  If effective, we will often do a series of injections spaced 3-6 weeks apart to maximally decrease your pain.  If you have any questions, please cll [phone] [facility]

## 2015-04-12 NOTE — Progress Notes (Signed)
pm

## 2015-04-12 NOTE — Progress Notes (Signed)
   Subjective:    Patient ID: Zachary Holmes, male    DOB: 11-Jun-1961, 54 y.o.   MRN: [id number]  HPI    Review of Systems     Objective:   Physical Exam        Assessment & Plan:    Left femoral nerve block  Date of procedure Ninth of September 2016  Preoperative diagnosis  Back pain at L4-L5 level - Primary    ICD-9-CM: 724.2 ICD-10-CM: M54.5    DDD (degenerative disc disease), lumbar     ICD-9-CM: 722.52 ICD-10-CM: M51.36    Radiculopathy of lumbosacral region     ICD-9-CM: 724.4 ICD-10-CM: M54.17    Knee pain, chronic, left     ICD-9-CM: 719.46, 338.29 ICD-10-CM: M25.562, G89.29    Primary osteoarthritis of left knee     ICD-9-CM: 715.16 ICD-10-CM: M17.12    Primary osteoarthritis of left hip     ICD-9-CM: 715.15 ICD-10-CM: M16.12    Shoulder capsulitis, right     ICD-9-CM: 726.2 ICD-10-CM: M75.81        Postoperative diagnosis Same  Procedure Left femoral nerve block  Surgeon Lance Bosch M.D.  Anesthesia None  Informed consent was obtained and the patient appeared to accept and understand the benefits and risks of this procedure Timeout was done to ascertain the side of the procedure and this was done in conjunction with the nursing staff  Description of procedure Patient was taken to the procedure room and placed in the prone position The left groin and the left upper thigh was prepped with Betadine. The left groin was then draped. After appropriate draping the left femoral artery was palpated Then a 22-gauge needle was in circuited immediately lateral to the palpated left femoral artery and inserted perpendicularly into the groin just below the inguinal canal. The needle was advanced into the groin and the paresthesia was elicited going down into the left thigh Aspirations were negative for blood and other body fluids. Then 10 cc of 0.5% bupivacaine and 40 mg of Kenalog were injected into the region of  the left femoral nerve. The needle was removed and adequate hemostasis was established. The patient tolerated the procedure quite well. Vital signs were stable and there were no adverse effects Patient was taken to recovery room in satisfactory condition where he was observed and subsequently discharged home.  His meloxicam was causing some epigastric discomfort and that medication was discontinued He was given Percocet 5/325 to take 1-2 tablets per day when necessary for pain Follow-up with the patient in 3 weeks  Lance Bosch M.D.

## 2015-04-15 ENCOUNTER — Telehealth: Payer: Self-pay | Admitting: *Deleted

## 2015-04-15 NOTE — Telephone Encounter (Signed)
Post procedure phone call done

## 2015-05-01 ENCOUNTER — Ambulatory Visit: Payer: Self-pay | Admitting: Anesthesiology

## 2015-06-29 IMAGING — CR DG CHEST 2V
2 series · 2 of 2 positions shown · non-contrast
Comparison: PA and lateral chest x-ray April 02, 2010

CLINICAL DATA: Preoperative exam prior to left shoulder
arthroplasty ; history of tobacco use and hypertension

EXAM:
CHEST  2 VIEW

[w chest pa]
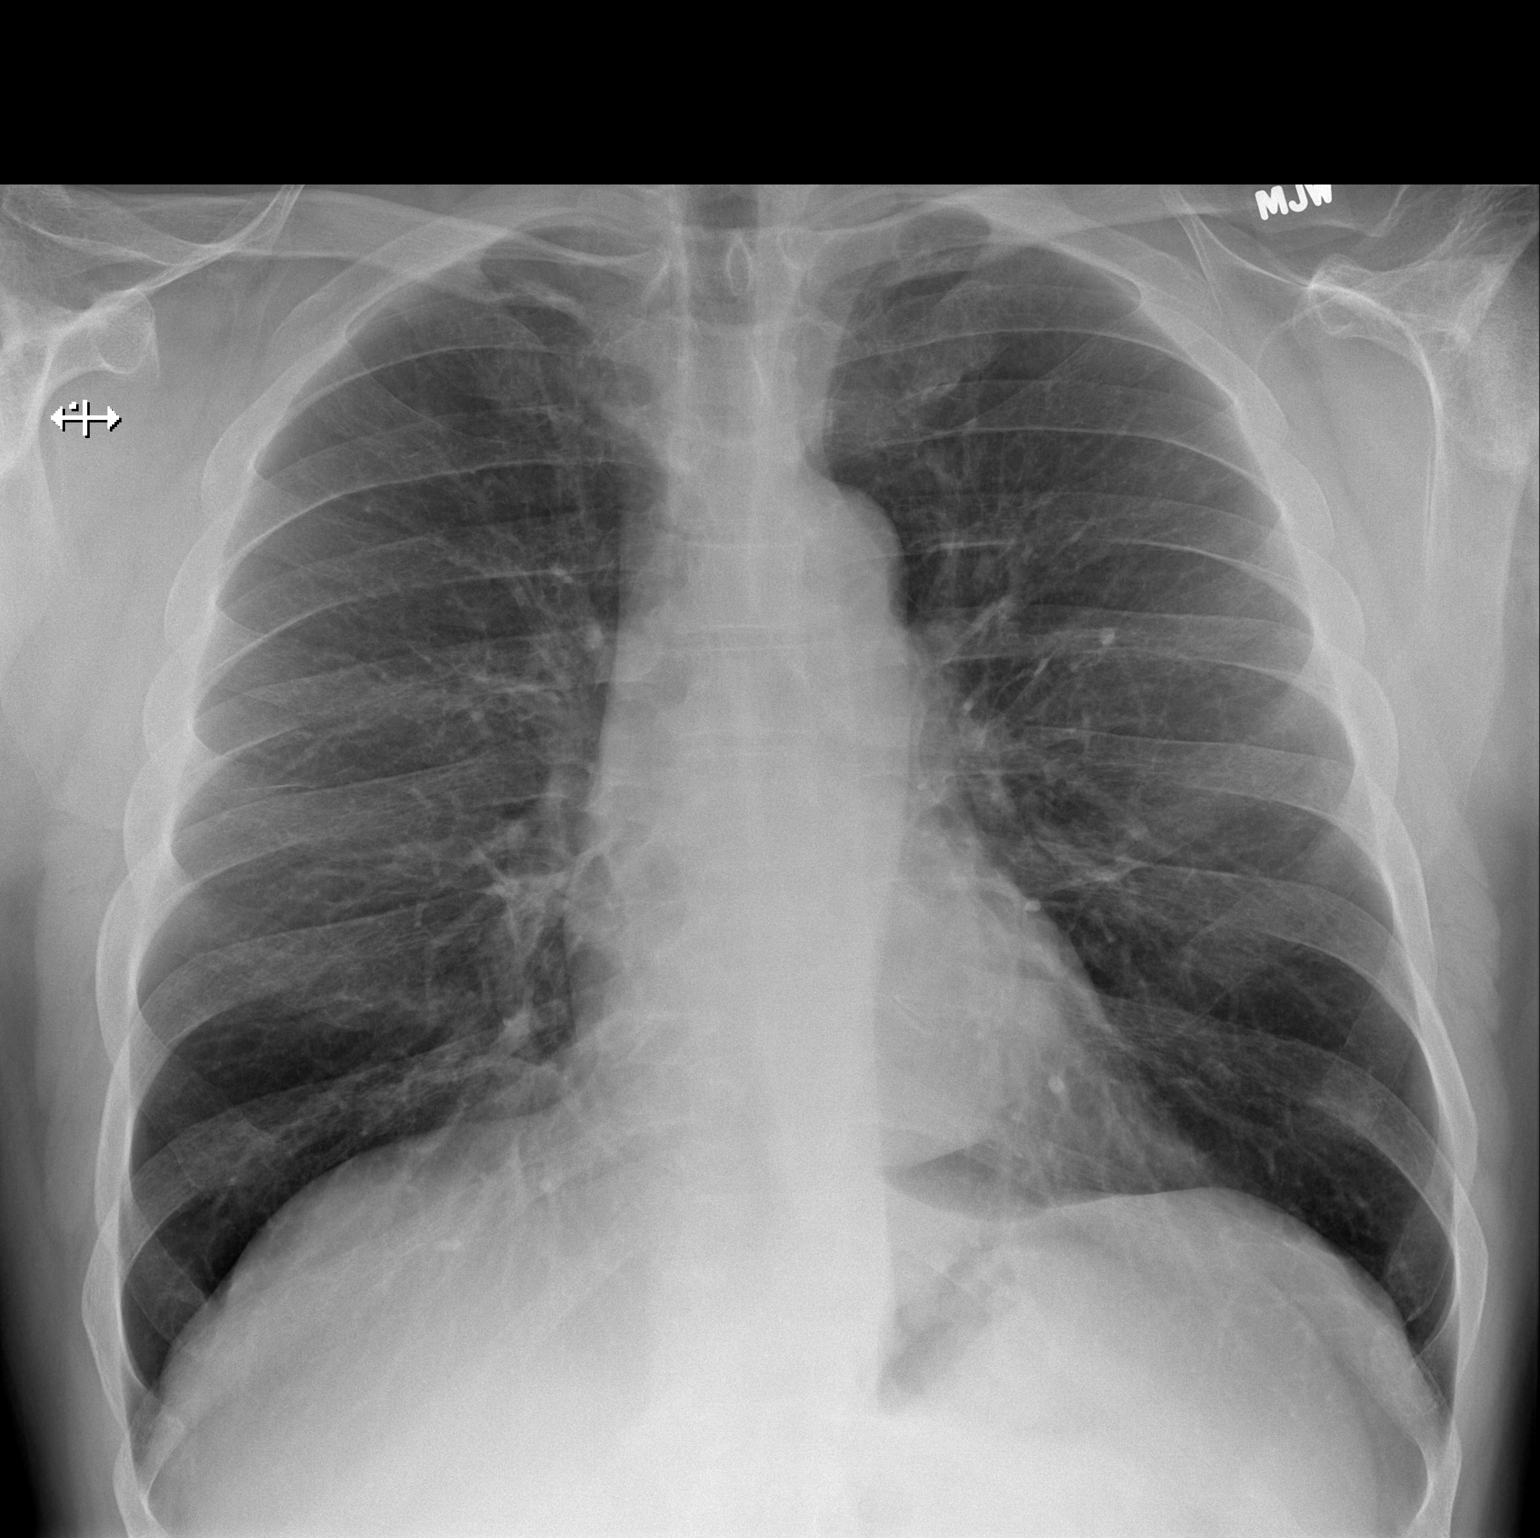

[w chest lat]
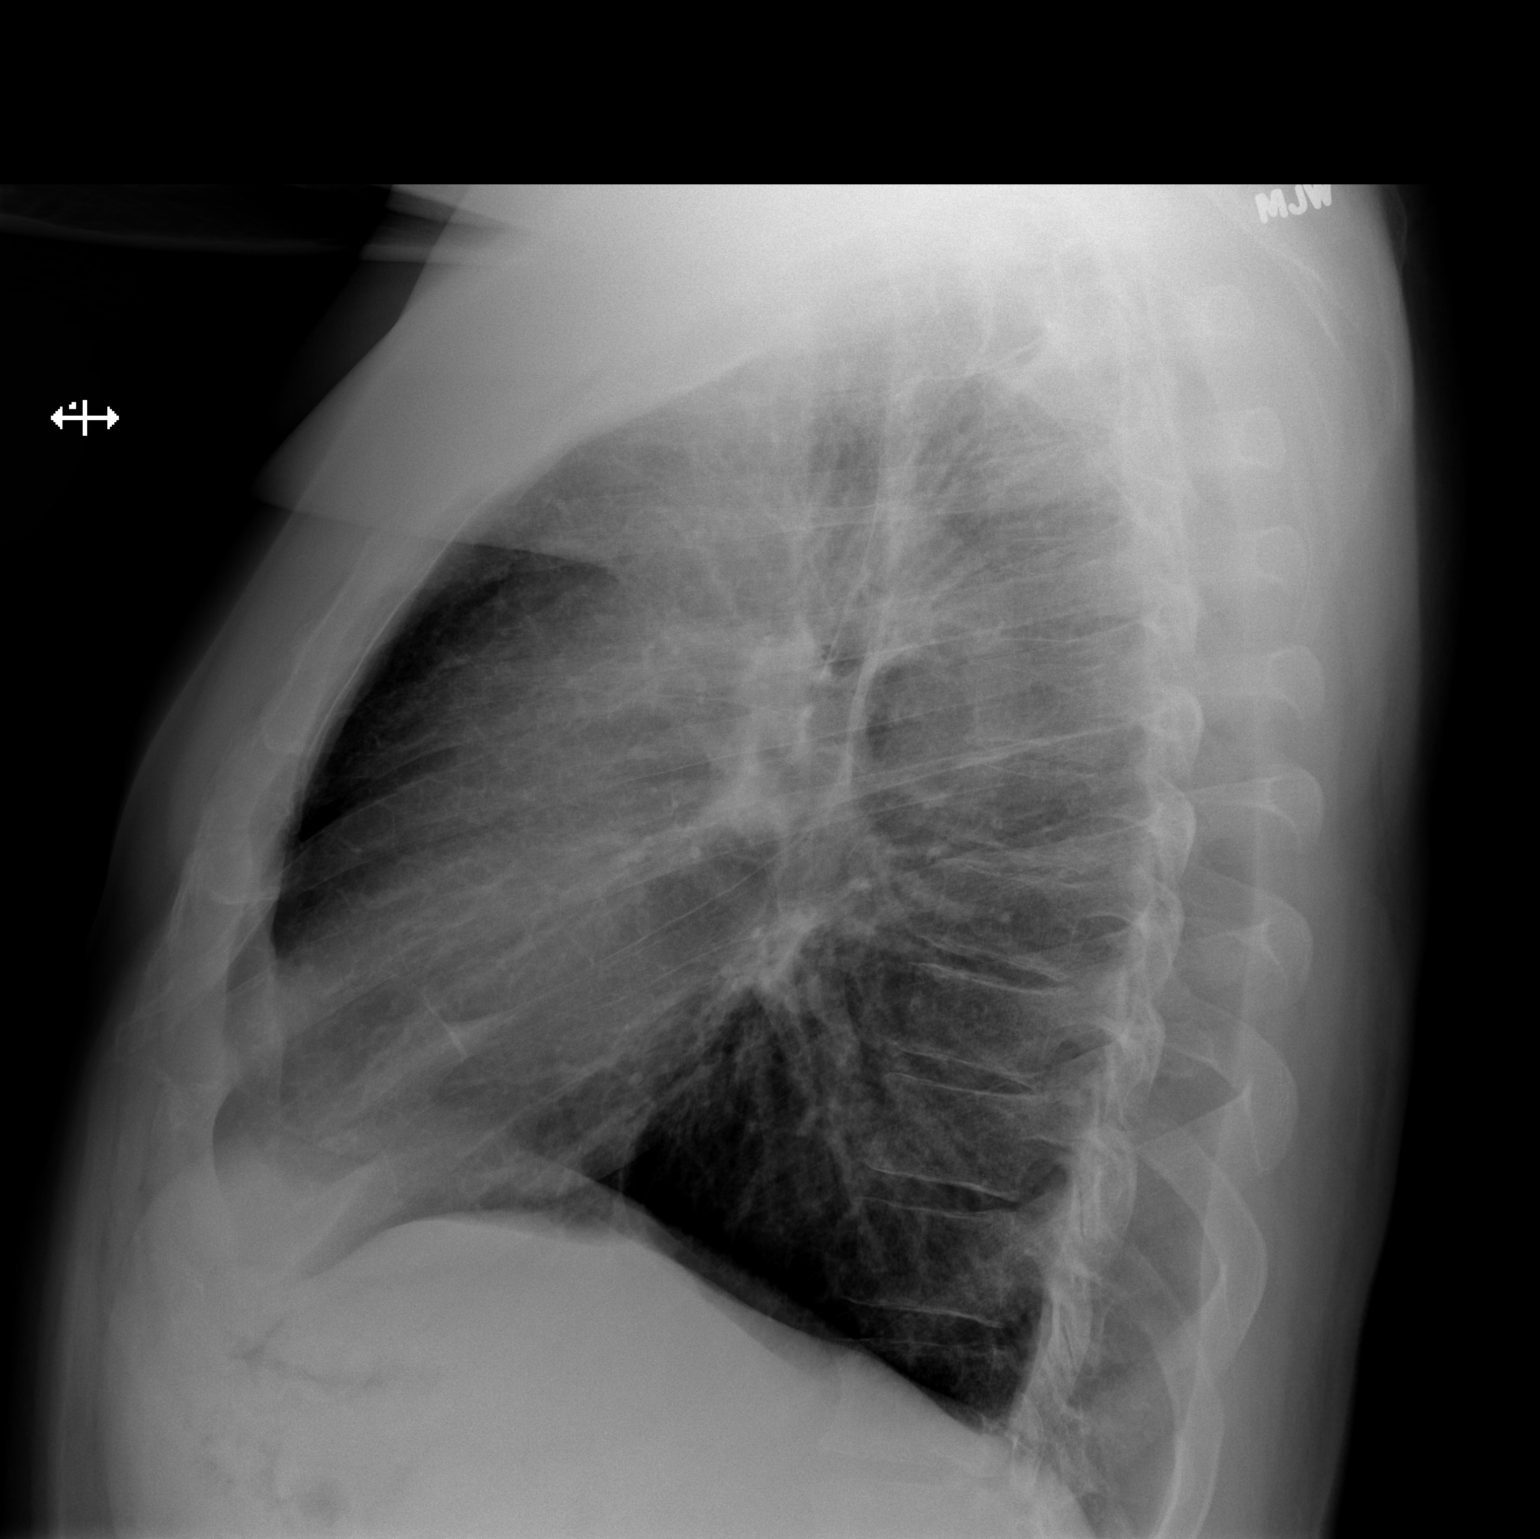

[2 of 2 positions shown; findings below may reference images not displayed]

FINDINGS: The lungs are well-expanded. There is no focal infiltrate. The
interstitial markings are coarse centrally but stable. The heart and
pulmonary vascularity are normal. The mediastinum is normal in
width. There is no pleural effusion. The bony thorax is
unremarkable.
IMPRESSION: Stable chronic bronchitic change. There is no active cardiopulmonary
disease.

## 2017-01-21 DIAGNOSIS — F411 Generalized anxiety disorder: Secondary | ICD-10-CM | POA: Diagnosis not present

## 2017-01-21 DIAGNOSIS — R10819 Abdominal tenderness, unspecified site: Secondary | ICD-10-CM | POA: Diagnosis not present

## 2017-01-21 DIAGNOSIS — N50819 Testicular pain, unspecified: Secondary | ICD-10-CM | POA: Diagnosis not present

## 2017-01-21 DIAGNOSIS — F17210 Nicotine dependence, cigarettes, uncomplicated: Secondary | ICD-10-CM | POA: Diagnosis not present

## 2017-01-21 DIAGNOSIS — K219 Gastro-esophageal reflux disease without esophagitis: Secondary | ICD-10-CM | POA: Diagnosis not present

## 2017-01-21 DIAGNOSIS — I10 Essential (primary) hypertension: Secondary | ICD-10-CM | POA: Diagnosis not present

## 2017-01-21 DIAGNOSIS — M7711 Lateral epicondylitis, right elbow: Secondary | ICD-10-CM | POA: Diagnosis not present

## 2017-01-21 DIAGNOSIS — M791 Myalgia: Secondary | ICD-10-CM | POA: Diagnosis not present

## 2017-01-26 DIAGNOSIS — I10 Essential (primary) hypertension: Secondary | ICD-10-CM | POA: Diagnosis not present

## 2017-01-26 DIAGNOSIS — Z0001 Encounter for general adult medical examination with abnormal findings: Secondary | ICD-10-CM | POA: Diagnosis not present

## 2017-01-26 DIAGNOSIS — E782 Mixed hyperlipidemia: Secondary | ICD-10-CM | POA: Diagnosis not present

## 2017-01-26 DIAGNOSIS — Z125 Encounter for screening for malignant neoplasm of prostate: Secondary | ICD-10-CM | POA: Diagnosis not present

## 2017-02-01 DIAGNOSIS — N50819 Testicular pain, unspecified: Secondary | ICD-10-CM | POA: Diagnosis not present

## 2017-02-01 DIAGNOSIS — R1030 Lower abdominal pain, unspecified: Secondary | ICD-10-CM | POA: Diagnosis not present

## 2017-04-28 ENCOUNTER — Encounter: Payer: Self-pay | Admitting: Gastroenterology

## 2017-04-29 ENCOUNTER — Telehealth: Payer: Self-pay | Admitting: Gastroenterology

## 2017-04-29 NOTE — Telephone Encounter (Signed)
Patient is returning your call to schedule colonoscopy

## 2017-04-30 ENCOUNTER — Other Ambulatory Visit: Payer: Self-pay

## 2017-04-30 DIAGNOSIS — Z86010 Personal history of colonic polyps: Secondary | ICD-10-CM

## 2017-04-30 NOTE — Telephone Encounter (Signed)
Spoke with pt and scheduled repeat colonoscopy at [facility] on 05/13/17.

## 2017-05-10 ENCOUNTER — Encounter: Payer: Self-pay | Admitting: *Deleted

## 2017-05-12 NOTE — Discharge Instructions (Signed)
General Anesthesia, Adult, Care After °These instructions provide you with information about caring for yourself after your procedure. Your health care provider may also give you more specific instructions. Your treatment has been planned according to current medical practices, but problems sometimes occur. Call your health care provider if you have any problems or questions after your procedure. °What can I expect after the procedure? °After the procedure, it is common to have: °· Vomiting. °· A sore throat. °· Mental slowness. ° °It is common to feel: °· Nauseous. °· Cold or shivery. °· Sleepy. °· Tired. °· Sore or achy, even in parts of your body where you did not have surgery. ° °Follow these instructions at home: °For at least 24 hours after the procedure: °· Do not: °? Participate in activities where you could fall or become injured. °? Drive. °? Use heavy machinery. °? Drink alcohol. °? Take sleeping pills or medicines that cause drowsiness. °? Make important decisions or sign legal documents. °? Take care of children on your own. °· Rest. °Eating and drinking °· If you vomit, drink water, juice, or soup when you can drink without vomiting. °· Drink enough fluid to keep your urine clear or pale yellow. °· Make sure you have little or no nausea before eating solid foods. °· Follow the diet recommended by your health care provider. °General instructions °· Have a responsible adult stay with you until you are awake and alert. °· Return to your normal activities as told by your health care provider. Ask your health care provider what activities are safe for you. °· Take over-the-counter and prescription medicines only as told by your health care provider. °· If you smoke, do not smoke without supervision. °· Keep all follow-up visits as told by your health care provider. This is important. °Contact a health care provider if: °· You continue to have nausea or vomiting at home, and medicines are not helpful. °· You  cannot drink fluids or start eating again. °· You cannot urinate after 8-12 hours. °· You develop a skin rash. °· You have fever. °· You have increasing redness at the site of your procedure. °Get help right away if: °· You have difficulty breathing. °· You have chest pain. °· You have unexpected bleeding. °· You feel that you are having a life-threatening or urgent problem. °This information is not intended to replace advice given to you by your health care provider. Make sure you discuss any questions you have with your health care provider. °Document Released: 10/26/2000 Document Revised: 12/23/2015 Document Reviewed: 07/04/2015 °Elsevier Interactive Patient Education © 2018 [employer] ° °

## 2017-05-13 ENCOUNTER — Ambulatory Visit: Payer: Medicare Other | Admitting: Anesthesiology

## 2017-05-13 ENCOUNTER — Ambulatory Visit
Admission: RE | Admit: 2017-05-13 | Discharge: 2017-05-13 | Disposition: A | Discharge status: Home / Self Care | Payer: Medicare Other | Source: Ambulatory Visit | Attending: Gastroenterology | Admitting: Gastroenterology

## 2017-05-13 ENCOUNTER — Encounter
Admission: RE | Disposition: A | Discharge status: Home / Self Care | Payer: Self-pay | Source: Ambulatory Visit | Attending: Gastroenterology

## 2017-05-13 DIAGNOSIS — F419 Anxiety disorder, unspecified: Secondary | ICD-10-CM | POA: Diagnosis not present

## 2017-05-13 DIAGNOSIS — D123 Benign neoplasm of transverse colon: Secondary | ICD-10-CM | POA: Diagnosis not present

## 2017-05-13 DIAGNOSIS — Z79899 Other long term (current) drug therapy: Secondary | ICD-10-CM | POA: Insufficient documentation

## 2017-05-13 DIAGNOSIS — F329 Major depressive disorder, single episode, unspecified: Secondary | ICD-10-CM | POA: Diagnosis not present

## 2017-05-13 DIAGNOSIS — D122 Benign neoplasm of ascending colon: Secondary | ICD-10-CM

## 2017-05-13 DIAGNOSIS — Z86010 Personal history of colonic polyps: Secondary | ICD-10-CM | POA: Insufficient documentation

## 2017-05-13 DIAGNOSIS — Z1211 Encounter for screening for malignant neoplasm of colon: Secondary | ICD-10-CM | POA: Diagnosis not present

## 2017-05-13 DIAGNOSIS — F17210 Nicotine dependence, cigarettes, uncomplicated: Secondary | ICD-10-CM | POA: Diagnosis not present

## 2017-05-13 DIAGNOSIS — Z96612 Presence of left artificial shoulder joint: Secondary | ICD-10-CM | POA: Insufficient documentation

## 2017-05-13 DIAGNOSIS — K219 Gastro-esophageal reflux disease without esophagitis: Secondary | ICD-10-CM | POA: Insufficient documentation

## 2017-05-13 DIAGNOSIS — I10 Essential (primary) hypertension: Secondary | ICD-10-CM | POA: Diagnosis not present

## 2017-05-13 DIAGNOSIS — K640 First degree hemorrhoids: Secondary | ICD-10-CM | POA: Insufficient documentation

## 2017-05-13 HISTORY — DX: Presence of dental prosthetic device (complete) (partial): Z97.2

## 2017-05-13 HISTORY — PX: COLONOSCOPY WITH PROPOFOL: SHX5780

## 2017-05-13 HISTORY — DX: Unspecified convulsions: R56.9

## 2017-05-13 SURGERY — COLONOSCOPY WITH PROPOFOL
Anesthesia: General | Wound class: Clean Contaminated

## 2017-05-13 MED ORDER — OXYCODONE HCL 5 MG PO TABS
5.0000 mg | ORAL_TABLET | Freq: Once | ORAL | Status: DC | PRN
Start: 2017-05-13 — End: 2017-05-13

## 2017-05-13 MED ORDER — PROPOFOL 10 MG/ML IV BOLUS
INTRAVENOUS | Status: DC | PRN
  Administered 2017-05-13: 100 mg via INTRAVENOUS
  Administered 2017-05-13 (×7): 50 mg via INTRAVENOUS
  Administered 2017-05-13: 100 mg via INTRAVENOUS
  Administered 2017-05-13: 50 mg via INTRAVENOUS

## 2017-05-13 MED ORDER — SODIUM CHLORIDE 0.9 % IV SOLN: INTRAVENOUS | Status: DC

## 2017-05-13 MED ORDER — MEPERIDINE HCL 25 MG/ML IJ SOLN: 6.2500 mg | INTRAMUSCULAR | Status: DC | PRN

## 2017-05-13 MED ORDER — LACTATED RINGERS IV SOLN
10.0000 mL/h | INTRAVENOUS | Status: DC
  Administered 2017-05-13: 10 mL/h via INTRAVENOUS

## 2017-05-13 MED ORDER — LIDOCAINE HCL (CARDIAC) 20 MG/ML IV SOLN
INTRAVENOUS | Status: DC | PRN
  Administered 2017-05-13: 40 mg via INTRAVENOUS

## 2017-05-13 MED ORDER — PROMETHAZINE HCL 25 MG/ML IJ SOLN: 6.2500 mg | INTRAMUSCULAR | Status: DC | PRN

## 2017-05-13 MED ORDER — OXYCODONE HCL 5 MG/5ML PO SOLN: 5.0000 mg | Freq: Once | ORAL | Status: DC | PRN

## 2017-05-13 MED ORDER — FENTANYL CITRATE (PF) 100 MCG/2ML IJ SOLN: 25.0000 ug | INTRAMUSCULAR | Status: DC | PRN

## 2017-05-13 SURGICAL SUPPLY — 23 items
CANISTER SUCT 1200ML W/VALVE (MISCELLANEOUS) ×2 IMPLANT
CLIP HMST 235XBRD CATH ROT (MISCELLANEOUS) IMPLANT
CLIP RESOLUTION 360 11X235 (MISCELLANEOUS)
FCP ESCP3.2XJMB 240X2.8X (MISCELLANEOUS)
FORCEPS BIOP RAD 4 LRG CAP 4 (CUTTING FORCEPS) ×1 IMPLANT
FORCEPS BIOP RJ4 240 W/NDL (MISCELLANEOUS)
FORCEPS ESCP3.2XJMB 240X2.8X (MISCELLANEOUS) IMPLANT
GOWN CVR UNV OPN BCK APRN NK (MISCELLANEOUS) ×2 IMPLANT
GOWN ISOL THUMB LOOP REG UNIV (MISCELLANEOUS) ×4
INJECTOR VARIJECT VIN23 (MISCELLANEOUS) IMPLANT
KIT DEFENDO VALVE AND CONN (KITS) IMPLANT
KIT ENDO PROCEDURE OLY (KITS) ×2 IMPLANT
MARKER SPOT ENDO TATTOO 5ML (MISCELLANEOUS) IMPLANT
PAD GROUND ADULT SPLIT (MISCELLANEOUS) IMPLANT
PROBE APC STR FIRE (PROBE) IMPLANT
RETRIEVER NET ROTH 2.5X230 LF (MISCELLANEOUS) IMPLANT
SNARE SHORT THROW 13M SML OVAL (MISCELLANEOUS) ×1 IMPLANT
SNARE SHORT THROW 30M LRG OVAL (MISCELLANEOUS) IMPLANT
SNARE SNG USE RND 15MM (INSTRUMENTS) IMPLANT
SPOT EX ENDOSCOPIC TATTOO (MISCELLANEOUS)
TRAP ETRAP POLY (MISCELLANEOUS) ×1 IMPLANT
VARIJECT INJECTOR VIN23 (MISCELLANEOUS)
WATER STERILE IRR 250ML POUR (IV SOLUTION) ×2 IMPLANT

## 2017-05-13 NOTE — Anesthesia Procedure Notes (Signed)
Procedure Name: MAC Date/Time: 05/13/2017 8:19 AM Performed by: Janna Arch Pre-anesthesia Checklist: Patient identified, Emergency Drugs available, Suction available and Patient being monitored Patient Re-evaluated:Patient Re-evaluated prior to induction Oxygen Delivery Method: Nasal cannula

## 2017-05-13 NOTE — Anesthesia Postprocedure Evaluation (Signed)
Anesthesia Post Note  Patient: Zachary Holmes  Procedure(s) Performed: COLONOSCOPY WITH PROPOFOL (N/A )  Patient location during evaluation: PACU Anesthesia Type: General Level of consciousness: awake and alert Pain management: pain level controlled Vital Signs Assessment: post-procedure vital signs reviewed and stable Respiratory status: spontaneous breathing, nonlabored ventilation, respiratory function stable and patient connected to nasal cannula oxygen Cardiovascular status: blood pressure returned to baseline and stable Postop Assessment: no apparent nausea or vomiting Anesthetic complications: no    Johany Hansman ELAINE

## 2017-05-13 NOTE — Op Note (Signed)
[facility] Gastroenterology Patient Name: Zachary Holmes Procedure Date: 05/13/2017 8:19 AM MRN: [id number] Account #: [id number] Date of Birth: 1961/03/27 Admit Type: Outpatient Age: 56 Room: [facility] OR ROOM 01 Gender: Male Note Status: Finalized Procedure:            Colonoscopy Indications:          High risk colon cancer surveillance: Personal history                        of colonic polyps Providers:            Lucilla Lame MD, MD Referring MD:         Lavera Guise, MD (Referring MD) Medicines:            Propofol per Anesthesia Complications:        No immediate complications. Procedure:            Pre-Anesthesia Assessment:                       - Prior to the procedure, a History and Physical was                        performed, and patient medications and allergies were                        reviewed. The patient's tolerance of previous                        anesthesia was also reviewed. The risks and benefits of                        the procedure and the sedation options and risks were                        discussed with the patient. All questions were                        answered, and informed consent was obtained. Prior                        Anticoagulants: The patient has taken no previous                        anticoagulant or antiplatelet agents. ASA Grade                        Assessment: II - A patient with mild systemic disease.                        After reviewing the risks and benefits, the patient was                        deemed in satisfactory condition to undergo the                        procedure.                       After obtaining informed consent, the colonoscope was  passed under direct vision. Throughout the procedure,                        the patient's blood pressure, pulse, and oxygen                        saturations were monitored continuously. The Olympus   Colonoscope 190 [id number]) was introduced through the                        anus and advanced to the the cecum, identified by                        appendiceal orifice and ileocecal valve. The entire                        colon was examined. Findings:      The perianal and digital rectal examinations were normal.      Three sessile polyps were found in the ascending colon. The polyps were       2 to 4 mm in size. These polyps were removed with a cold biopsy forceps.       Resection and retrieval were complete.      A 7 mm polyp was found in the ascending colon. The polyp was sessile.       The polyp was removed with a cold snare. Resection and retrieval were       complete.      Two sessile polyps were found in the transverse colon. The polyps were 3       to 4 mm in size. These polyps were removed with a cold snare. Resection       and retrieval were complete.      Non-bleeding internal hemorrhoids were found during retroflexion. The       hemorrhoids were Grade I (internal hemorrhoids that do not prolapse). Impression:           - Three 2 to 4 mm polyps in the ascending colon,                        removed with a cold biopsy forceps. Resected and                        retrieved.                       - One 7 mm polyp in the ascending colon, removed with a                        cold snare. Resected and retrieved.                       - Two 3 to 4 mm polyps in the transverse colon, removed                        with a cold snare. Resected and retrieved.                       - Non-bleeding internal hemorrhoids. Recommendation:       - Discharge patient to home.                       -  Resume previous diet.                       - Continue present medications.                       - Await pathology results.                       - Repeat colonoscopy in 3 years for surveillance. Procedure Code(s):    --- Professional ---                       [phone], Colonoscopy, flexible; with  removal of tumor(s),                        polyp(s), or other lesion(s) by snare technique                       45380, 1, Colonoscopy, flexible; with biopsy, single                        or multiple Diagnosis Code(s):    --- Professional ---                       Z86.010, Personal history of colonic polyps                       D12.2, Benign neoplasm of ascending colon                       D12.3, Benign neoplasm of transverse colon (hepatic                        flexure or splenic flexure) CPT copyright 2016 [employer] Medical Association. All rights reserved. The codes documented in this report are preliminary and upon coder review may  be revised to meet current compliance requirements. Lucilla Lame MD, MD 05/13/2017 8:45:11 AM This report has been signed electronically. Number of Addenda: 0 Note Initiated On: 05/13/2017 8:19 AM Scope Withdrawal Time: 0 hours 15 minutes 29 seconds  Total Procedure Duration: 0 hours 17 minutes 43 seconds       [facility]

## 2017-05-13 NOTE — H&P (Signed)
Lucilla Lame, MD [facility] [address], [place], [place] [address] Phone:[phone] Fax : [phone]  Primary Care Physician:  Ronnell Freshwater, NP Primary Gastroenterologist:  Dr. Allen Norris  Pre-Procedure History & Physical: HPI:  Zachary Holmes is a 56 y.o. male is here for an colonoscopy.   Past Medical History:  Diagnosis Date  . Anxiety   . Arthritis    "legs" (09/20/2014)  . Depression   . GERD (gastroesophageal reflux disease)   . Hypertension   . Seizures ([place]) 1983   x1, S/P head injury  . Wears dentures    partial upper    Past Surgical History:  Procedure Laterality Date  . ANKLE FUSION Right 11/24/2007   Archie Endo 12/04/2010  . FRACTURE SURGERY    . JOINT REPLACEMENT    . KNEE ARTHROCENTESIS Left X 5   "got infected . . ."  . SHOULDER ARTHROSCOPY Left 06/2007   Archie Endo 12/03/2010  . TOTAL KNEE ARTHROPLASTY Left 2008   Archie Endo 04/09/2010  . TOTAL SHOULDER ARTHROPLASTY Left 09/20/2014  . TOTAL SHOULDER ARTHROPLASTY Left 09/20/2014   Procedure: TOTAL SHOULDER ARTHROPLASTY;  Surgeon: Nita Sells, MD;  Location: [facility];  Service: Orthopedics;  Laterality: Left;  Left total shoulder arthroplasty    Prior to Admission medications   Medication Sig Start Date End Date Taking? Authorizing Provider  ALPRAZolam [name] 0.5 MG tablet Take 0.5 mg by mouth at bedtime as needed for anxiety.   Yes [provider]  cyclobenzaprine (FLEXERIL) 10 MG tablet Take 10 mg by mouth 3 (three) times daily as needed for muscle spasms.   Yes [provider]  lisinopril (PRINIVIL,ZESTRIL) 20 MG tablet Take 20 mg by mouth daily.   Yes [provider]  omeprazole (PRILOSEC) 40 MG capsule Take 40 mg by mouth daily.   Yes [provider]  tiZANidine (ZANAFLEX) 4 MG tablet Take 4 mg by mouth at bedtime as needed for muscle spasms.   Yes [provider]    Allergies as of 04/30/2017  . (No Known Allergies)    Family History  Problem  Relation Age of Onset  . COPD Mother   . Heart disease Father     Social History   Social History  . Marital status: Single    Spouse name: N/A  . Number of children: N/A  . Years of education: N/A   Occupational History  . Not on file.   Social History Main Topics  . Smoking status: Current Every Day Smoker    Packs/day: 0.75    Years: 20.00    Types: Cigarettes  . Smokeless tobacco: Former Systems developer    Types: Chew  . Alcohol use 7.2 oz/week    12 Cans of beer per week  . Drug use: Yes    Types: Marijuana     Comment: 09/20/2014 "maybe once/month"  . Sexual activity: Not Currently   Other Topics Concern  . Not on file   Social History Narrative  . No narrative on file    Review of Systems: See HPI, otherwise negative ROS  Physical Exam: BP (!) 148/98   Pulse 89   Temp 97.7 F (36.5 C) (Temporal)   Ht 6' (1.829 m)   Wt 245 lb (111.1 kg)   SpO2 97%   BMI 33.23 kg/m  General:   Alert,  pleasant and cooperative in NAD Head:  Normocephalic and atraumatic. Neck:  Supple; no masses or thyromegaly. Lungs:  Clear throughout to auscultation.  Heart:  Regular rate and rhythm. Abdomen:  Soft, nontender and nondistended. Normal bowel sounds, without guarding, and without rebound.   Neurologic:  Alert and  oriented x4;  grossly normal neurologically.  Impression/Plan: Zachary Holmes is here for an colonoscopy to be performed for history of colon polyps  Risks, benefits, limitations, and alternatives regarding  colonoscopy have been reviewed with the patient.  Questions have been answered.  All parties agreeable.   Lucilla Lame, MD  05/13/2017, 7:40 AM

## 2017-05-13 NOTE — Transfer of Care (Signed)
Immediate Anesthesia Transfer of Care Note  Patient: Zachary Holmes  Procedure(s) Performed: COLONOSCOPY WITH PROPOFOL (N/A )  Patient Location: PACU  Anesthesia Type: General  Level of Consciousness: awake, alert  and patient cooperative  Airway and Oxygen Therapy: Patient Spontanous Breathing and Patient connected to supplemental oxygen  Post-op Assessment: Post-op Vital signs reviewed, Patient's Cardiovascular Status Stable, Respiratory Function Stable, Patent Airway and No signs of Nausea or vomiting  Post-op Vital Signs: Reviewed and stable  Complications: No apparent anesthesia complications

## 2017-05-13 NOTE — Anesthesia Preprocedure Evaluation (Signed)
Anesthesia Evaluation  Patient identified by MRN, date of birth, ID band Patient awake    Reviewed: Allergy & Precautions, H&P , NPO status , Patient's Chart, lab work & pertinent test results, reviewed documented beta blocker date and time   Airway Mallampati: II  TM Distance: >3 FB Neck ROM: full    Dental no notable dental hx.    Pulmonary neg pulmonary ROS, Current Smoker,    Pulmonary exam normal breath sounds clear to auscultation       Cardiovascular Exercise Tolerance: Good hypertension,  Rhythm:regular Rate:Normal     Neuro/Psych Seizures -,  PSYCHIATRIC DISORDERS    GI/Hepatic Neg liver ROS, GERD  ,  Endo/Other  negative endocrine ROS  Renal/GU negative Renal ROS  negative genitourinary   Musculoskeletal   Abdominal   Peds  Hematology negative hematology ROS (+)   Anesthesia Other Findings   Reproductive/Obstetrics negative OB ROS                             Anesthesia Physical Anesthesia Plan  ASA: II  Anesthesia Plan: General   Post-op Pain Management:    Induction:   PONV Risk Score and Plan:   Airway Management Planned:   Additional Equipment:   Intra-op Plan:   Post-operative Plan:   Informed Consent: I have reviewed the patients History and Physical, chart, labs and discussed the procedure including the risks, benefits and alternatives for the proposed anesthesia with the patient or authorized representative who has indicated his/her understanding and acceptance.   Dental Advisory Given  Plan Discussed with: CRNA  Anesthesia Plan Comments:         Anesthesia Quick Evaluation

## 2017-05-14 ENCOUNTER — Encounter: Payer: Self-pay | Admitting: Gastroenterology

## 2017-05-18 ENCOUNTER — Encounter: Payer: Self-pay | Admitting: Gastroenterology

## 2017-05-19 ENCOUNTER — Encounter: Payer: Self-pay | Admitting: Gastroenterology

## 2017-05-26 ENCOUNTER — Telehealth: Payer: Self-pay | Admitting: Gastroenterology

## 2017-05-26 NOTE — Telephone Encounter (Signed)
Patient left a voice message to cancel appt. He will call back to reschedule.

## 2017-05-27 ENCOUNTER — Ambulatory Visit: Payer: Medicare Other | Admitting: Gastroenterology

## 2017-06-28 DIAGNOSIS — F17210 Nicotine dependence, cigarettes, uncomplicated: Secondary | ICD-10-CM | POA: Diagnosis not present

## 2017-06-28 DIAGNOSIS — I10 Essential (primary) hypertension: Secondary | ICD-10-CM | POA: Diagnosis not present

## 2017-06-28 DIAGNOSIS — R252 Cramp and spasm: Secondary | ICD-10-CM | POA: Diagnosis not present

## 2017-06-28 DIAGNOSIS — F411 Generalized anxiety disorder: Secondary | ICD-10-CM | POA: Diagnosis not present

## 2017-06-28 DIAGNOSIS — K219 Gastro-esophageal reflux disease without esophagitis: Secondary | ICD-10-CM | POA: Diagnosis not present

## 2017-08-25 DIAGNOSIS — M25562 Pain in left knee: Secondary | ICD-10-CM | POA: Diagnosis not present

## 2017-08-26 ENCOUNTER — Other Ambulatory Visit: Payer: Self-pay | Admitting: Orthopedic Surgery

## 2017-08-26 DIAGNOSIS — M25562 Pain in left knee: Secondary | ICD-10-CM

## 2017-08-26 DIAGNOSIS — Z96659 Presence of unspecified artificial knee joint: Secondary | ICD-10-CM | POA: Diagnosis not present

## 2017-09-27 ENCOUNTER — Ambulatory Visit (INDEPENDENT_AMBULATORY_CARE_PROVIDER_SITE_OTHER): Payer: Medicare Other | Admitting: Nurse Practitioner

## 2017-09-27 ENCOUNTER — Encounter: Payer: Self-pay | Admitting: Nurse Practitioner

## 2017-09-27 VITALS — BP 139/98 | HR 94 | Resp 16 | Ht 72.0 in | Wt 268.0 lb

## 2017-09-27 DIAGNOSIS — K219 Gastro-esophageal reflux disease without esophagitis: Secondary | ICD-10-CM

## 2017-09-27 DIAGNOSIS — R252 Cramp and spasm: Secondary | ICD-10-CM | POA: Diagnosis not present

## 2017-09-27 DIAGNOSIS — F411 Generalized anxiety disorder: Secondary | ICD-10-CM

## 2017-09-27 DIAGNOSIS — I10 Essential (primary) hypertension: Secondary | ICD-10-CM

## 2017-09-27 MED ORDER — LISINOPRIL 20 MG PO TABS: 20.0000 mg | ORAL_TABLET | Freq: Every day | ORAL | 3 refills | Status: DC

## 2017-09-27 MED ORDER — OMEPRAZOLE 40 MG PO CPDR: 40.0000 mg | DELAYED_RELEASE_CAPSULE | Freq: Every day | ORAL | 3 refills | Status: DC

## 2017-09-27 MED ORDER — ALPRAZOLAM 0.5 MG PO TABS: 0.5000 mg | ORAL_TABLET | Freq: Three times a day (TID) | ORAL | 3 refills | Status: DC | PRN

## 2017-09-27 MED ORDER — CYCLOBENZAPRINE HCL 10 MG PO TABS: 10.0000 mg | ORAL_TABLET | Freq: Three times a day (TID) | ORAL | 3 refills | Status: DC | PRN

## 2017-09-27 NOTE — Progress Notes (Signed)
[facility] [place], [place] [address]  Internal MEDICINE  Office Visit Note  Patient Name: Zachary Holmes  [id number]  [id number]  Date of Service: 10/09/2017  Chief Complaint  Patient presents with  . Osteoarthritis    generalized, multiple joints.   . Hypertension    The patient is here for routine follow up exam. He does continue to have generalized joint pain. This is most severe in left knee. Did have joint replacement, which did get infected. Had to have spacer for some time, prior to replacing the joint again. Since then, he has moderate pain. He did see his orthopedic surgeon since last visit. Fluid drained from the knee, but no other treatment/therapy was suggested. He does need refills of his regular medicatons.   Hypertension  This is a chronic problem. The current episode started more than 1 year ago. The problem is unchanged. The problem is resistant. Associated symptoms include anxiety. Pertinent negatives include no chest pain, neck pain, palpitations or shortness of breath. There are no associated agents to hypertension. Risk factors for coronary artery disease include male gender, dyslipidemia, stress and smoking/tobacco exposure. Past treatments include ACE inhibitors. The current treatment provides moderate improvement. There are no compliance problems.     Pt is here for routine follow up.    Current Medication: Outpatient Encounter Medications as of 09/27/2017  Medication Sig  . ALPRAZolam (XANAX) 0.5 MG tablet Take 1 tablet (0.5 mg total) by mouth 3 (three) times daily as needed for anxiety.  . cyclobenzaprine (FLEXERIL) 10 MG tablet Take 1 tablet (10 mg total) by mouth 3 (three) times daily as needed for muscle spasms.  Marland Kitchen lisinopril (PRINIVIL,ZESTRIL) 20 MG tablet Take 1 tablet (20 mg total) by mouth daily.  Marland Kitchen omeprazole (PRILOSEC) 40 MG capsule Take 1 capsule (40 mg total) by mouth daily.  . [DISCONTINUED] ALPRAZolam (XANAX) 0.5 MG  tablet Take 0.5 mg by mouth 3 (three) times daily as needed for anxiety.   . [DISCONTINUED] cyclobenzaprine (FLEXERIL) 10 MG tablet Take 10 mg by mouth 3 (three) times daily as needed for muscle spasms.  . [DISCONTINUED] lisinopril (PRINIVIL,ZESTRIL) 20 MG tablet Take 20 mg by mouth daily.  . [DISCONTINUED] omeprazole (PRILOSEC) 40 MG capsule Take 40 mg by mouth daily.  . [DISCONTINUED] tiZANidine (ZANAFLEX) 4 MG tablet Take 4 mg by mouth at bedtime as needed for muscle spasms.   No facility-administered encounter medications on file as of 09/27/2017.     Surgical History: Past Surgical History:  Procedure Laterality Date  . ANKLE FUSION Right 11/24/2007   Archie Endo 12/04/2010  . COLONOSCOPY WITH PROPOFOL N/A 05/13/2017   Procedure: COLONOSCOPY WITH PROPOFOL;  Surgeon: Lucilla Lame, MD;  Location: [facility];  Service: Endoscopy;  Laterality: N/A;  requests early  . FRACTURE SURGERY    . JOINT REPLACEMENT    . KNEE ARTHROCENTESIS Left X 5   "got infected . . ."  . SHOULDER ARTHROSCOPY Left 06/2007   Archie Endo 12/03/2010  . TOTAL KNEE ARTHROPLASTY Left 2008   Archie Endo 04/09/2010  . TOTAL SHOULDER ARTHROPLASTY Left 09/20/2014  . TOTAL SHOULDER ARTHROPLASTY Left 09/20/2014   Procedure: TOTAL SHOULDER ARTHROPLASTY;  Surgeon: Nita Sells, MD;  Location: [facility];  Service: Orthopedics;  Laterality: Left;  Left total shoulder arthroplasty    Medical History: Past Medical History:  Diagnosis Date  . Anxiety   . Arthritis    "legs" (09/20/2014)  . Depression   . GERD (gastroesophageal reflux disease)   .  Hypertension   . Seizures ([place]) 1983   x1, S/P head injury  . Wears dentures    partial upper    Family History: Family History  Problem Relation Age of Onset  . COPD Mother   . Heart disease Father     Social History   Socioeconomic History  . Marital status: Single    Spouse name: Not on file  . Number of children: Not on file  . Years of education: Not on file  .  Highest education level: Not on file  Social Needs  . Financial resource strain: Not on file  . Food insecurity - worry: Not on file  . Food insecurity - inability: Not on file  . Transportation needs - medical: Not on file  . Transportation needs - non-medical: Not on file  Occupational History  . Not on file  Tobacco Use  . Smoking status: Current Every Day Smoker    Packs/day: 0.75    Years: 20.00    Pack years: 15.00    Types: Cigarettes  . Smokeless tobacco: Former Systems developer    Types: Chew  Substance and Sexual Activity  . Alcohol use: Yes    Alcohol/week: 7.2 oz    Types: 12 Cans of beer per week  . Drug use: Yes    Types: Marijuana    Comment: 09/20/2014 "maybe once/month"  . Sexual activity: Not Currently  Other Topics Concern  . Not on file  Social History Narrative  . Not on file      Review of Systems  Constitutional: Negative for activity change, chills, fatigue and unexpected weight change.  HENT: Negative for congestion, postnasal drip, rhinorrhea, sneezing and sore throat.   Eyes: Negative for redness.  Respiratory: Negative for cough, chest tightness and shortness of breath.   Cardiovascular: Negative for chest pain and palpitations.  Gastrointestinal: Negative for abdominal pain, constipation, diarrhea, nausea and vomiting.  Endocrine: Negative for cold intolerance.  Genitourinary: Negative for dysuria and frequency.  Musculoskeletal: Positive for arthralgias and myalgias. Negative for back pain, joint swelling and neck pain.       Generalized joint pain, most severe in the left knee.   Skin: Negative for rash.  Allergic/Immunologic: Negative for environmental allergies, food allergies and immunocompromised state.  Neurological: Negative.  Negative for tremors and numbness.  Hematological: Negative for adenopathy. Does not bruise/bleed easily.  Psychiatric/Behavioral: Negative for behavioral problems (Depression), sleep disturbance and suicidal ideas. The  patient is nervous/anxious.    Today's Vitals   09/27/17 1020  BP: (!) 139/98  Pulse: 94  Resp: 16  SpO2: 95%  Weight: 268 lb (121.6 kg)  Height: 6' (1.829 m)    Physical Exam  Constitutional: He is oriented to person, place, and time. He appears well-developed and well-nourished. No distress.  HENT:  Head: Normocephalic and atraumatic.  Mouth/Throat: Oropharynx is clear and moist. No oropharyngeal exudate.  Eyes: EOM are normal. Pupils are equal, round, and reactive to light.  Neck: Normal range of motion. Neck supple. No JVD present. Carotid bruit is not present. No tracheal deviation present. No thyromegaly present.  Cardiovascular: Normal rate, regular rhythm and normal heart sounds. Exam reveals no gallop and no friction rub.  No murmur heard. Pulmonary/Chest: Effort normal and breath sounds normal. No respiratory distress. He has no wheezes. He has no rales. He exhibits no tenderness.  Abdominal: Soft. Bowel sounds are normal. There is no tenderness.  Musculoskeletal:  Moderate lower back pain and generalized joint pain with point tenderness  present. No visible or palpable abnormalities are noted at this time.   Lymphadenopathy:    He has no cervical adenopathy.  Neurological: He is alert and oriented to person, place, and time. No cranial nerve deficit.  Skin: Skin is warm and dry. He is not diaphoretic.  Psychiatric: He has a normal mood and affect. His behavior is normal. Judgment and thought content normal.  Nursing note and vitals reviewed.   Assessment/Plan:  1. Essential hypertension Generally stable. Continue bp medication as prescribed  - lisinopril (PRINIVIL,ZESTRIL) 20 MG tablet; Take 1 tablet (20 mg total) by mouth daily.  Dispense: 30 tablet; Refill: 3  2. Gastroesophageal reflux disease without esophagitis - omeprazole (PRILOSEC) 40 MG capsule; Take 1 capsule (40 mg total) by mouth daily.  Dispense: 30 capsule; Refill: 3  3. Cramp and spasm -  cyclobenzaprine (FLEXERIL) 10 MG tablet; Take 1 tablet (10 mg total) by mouth 3 (three) times daily as needed for muscle spasms.  Dispense: 90 tablet; Refill: 3  4. GAD (generalized anxiety disorder) May continue alprazolam 0.5mg  three times daily if needed for acute anxiety.  - ALPRAZolam (XANAX) 0.5 MG tablet; Take 1 tablet (0.5 mg total) by mouth 3 (three) times daily as needed for anxiety.  Dispense: 90 tablet; Refill: 3  General Counseling: jp eastham understanding of the findings of todays visit and agrees with plan of treatment. I have discussed any further diagnostic evaluation that may be needed or ordered today. We also reviewed his medications today. he has been encouraged to call the office with any questions or concerns that should arise related to todays visit.   This patient was seen by Leretha Pol, FNP- C in Collaboration with Dr Lavera Guise as a part of collaborative care agreement     Meds ordered this encounter  Medications  . ALPRAZolam (XANAX) 0.5 MG tablet    Sig: Take 1 tablet (0.5 mg total) by mouth 3 (three) times daily as needed for anxiety.    Dispense:  90 tablet    Refill:  3    Order Specific Question:   Supervising Provider    Answer:   Lavera Guise [[id number]]  . cyclobenzaprine (FLEXERIL) 10 MG tablet    Sig: Take 1 tablet (10 mg total) by mouth 3 (three) times daily as needed for muscle spasms.    Dispense:  90 tablet    Refill:  3    Order Specific Question:   Supervising Provider    Answer:   Lavera Guise [[id number]]  . lisinopril (PRINIVIL,ZESTRIL) 20 MG tablet    Sig: Take 1 tablet (20 mg total) by mouth daily.    Dispense:  30 tablet    Refill:  3    Order Specific Question:   Supervising Provider    Answer:   Lavera Guise [[id number]]  . omeprazole (PRILOSEC) 40 MG capsule    Sig: Take 1 capsule (40 mg total) by mouth daily.    Dispense:  30 capsule    Refill:  3    Order Specific Question:   Supervising Provider    Answer:   Lavera Guise  [[id number]]    Time spent: 67 Minutes    Dr Lavera Guise Internal medicine

## 2018-01-25 ENCOUNTER — Ambulatory Visit: Payer: Medicare Other | Admitting: Nurse Practitioner

## 2018-01-25 ENCOUNTER — Encounter: Payer: Self-pay | Admitting: Nurse Practitioner

## 2018-01-25 VITALS — BP 118/84 | HR 69 | Resp 16 | Ht 72.0 in | Wt 251.0 lb

## 2018-01-25 DIAGNOSIS — K219 Gastro-esophageal reflux disease without esophagitis: Secondary | ICD-10-CM

## 2018-01-25 DIAGNOSIS — M25511 Pain in right shoulder: Secondary | ICD-10-CM

## 2018-01-25 DIAGNOSIS — R252 Cramp and spasm: Secondary | ICD-10-CM | POA: Diagnosis not present

## 2018-01-25 DIAGNOSIS — M1990 Unspecified osteoarthritis, unspecified site: Secondary | ICD-10-CM | POA: Diagnosis not present

## 2018-01-25 DIAGNOSIS — G8929 Other chronic pain: Secondary | ICD-10-CM

## 2018-01-25 DIAGNOSIS — F411 Generalized anxiety disorder: Secondary | ICD-10-CM

## 2018-01-25 MED ORDER — CYCLOBENZAPRINE HCL 10 MG PO TABS: 10.0000 mg | ORAL_TABLET | Freq: Three times a day (TID) | ORAL | 3 refills | Status: DC | PRN

## 2018-01-25 MED ORDER — TRAMADOL HCL 50 MG PO TABS
50.0000 mg | ORAL_TABLET | Freq: Three times a day (TID) | ORAL | 0 refills | Status: DC | PRN
Start: 2018-01-25 — End: 2018-09-27

## 2018-01-25 MED ORDER — OMEPRAZOLE 40 MG PO CPDR: 40.0000 mg | DELAYED_RELEASE_CAPSULE | Freq: Every day | ORAL | 5 refills | Status: DC

## 2018-01-25 MED ORDER — ALPRAZOLAM 0.5 MG PO TABS: 0.5000 mg | ORAL_TABLET | Freq: Three times a day (TID) | ORAL | 3 refills | Status: DC | PRN

## 2018-01-25 NOTE — Progress Notes (Signed)
[facility] [place], [place] [address]  Internal MEDICINE  Office Visit Note  Patient Name: Zachary Holmes  [id number]  [id number]  Date of Service: 02/16/2018   Pt is here for routine follow up.   Chief Complaint  Patient presents with  . other    hands has a tightness feeling been going on for a few months  . Gastroesophageal Reflux    59month follow up    Chronic arthritis in hands, shoulders, and knees. Getting worse in right shoulder. Needs to see orthopedic provider for cortisone injection.       Current Medication: Outpatient Encounter Medications as of 01/25/2018  Medication Sig  . ALPRAZolam (XANAX) 0.5 MG tablet Take 1 tablet (0.5 mg total) by mouth 3 (three) times daily as needed for anxiety.  . cyclobenzaprine (FLEXERIL) 10 MG tablet Take 1 tablet (10 mg total) by mouth 3 (three) times daily as needed for muscle spasms.  Marland Kitchen lisinopril (PRINIVIL,ZESTRIL) 20 MG tablet Take 1 tablet (20 mg total) by mouth daily.  Marland Kitchen omeprazole (PRILOSEC) 40 MG capsule Take 1 capsule (40 mg total) by mouth daily.  . [DISCONTINUED] ALPRAZolam (XANAX) 0.5 MG tablet Take 1 tablet (0.5 mg total) by mouth 3 (three) times daily as needed for anxiety.  . [DISCONTINUED] cyclobenzaprine (FLEXERIL) 10 MG tablet Take 1 tablet (10 mg total) by mouth 3 (three) times daily as needed for muscle spasms.  . [DISCONTINUED] omeprazole (PRILOSEC) 40 MG capsule Take 1 capsule (40 mg total) by mouth daily.  . traMADol (ULTRAM) 50 MG tablet Take 1 tablet (50 mg total) by mouth every 8 (eight) hours as needed.   No facility-administered encounter medications on file as of 01/25/2018.     Surgical History: Past Surgical History:  Procedure Laterality Date  . ANKLE FUSION Right 11/24/2007   Archie Endo 12/04/2010  . COLONOSCOPY WITH PROPOFOL N/A 05/13/2017   Procedure: COLONOSCOPY WITH PROPOFOL;  Surgeon: Lucilla Lame, MD;  Location: [facility];  Service: Endoscopy;  Laterality:  N/A;  requests early  . FRACTURE SURGERY    . JOINT REPLACEMENT    . KNEE ARTHROCENTESIS Left X 5   "got infected . . ."  . SHOULDER ARTHROSCOPY Left 06/2007   Archie Endo 12/03/2010  . TOTAL KNEE ARTHROPLASTY Left 2008   Archie Endo 04/09/2010  . TOTAL SHOULDER ARTHROPLASTY Left 09/20/2014  . TOTAL SHOULDER ARTHROPLASTY Left 09/20/2014   Procedure: TOTAL SHOULDER ARTHROPLASTY;  Surgeon: Nita Sells, MD;  Location: [facility];  Service: Orthopedics;  Laterality: Left;  Left total shoulder arthroplasty    Medical History: Past Medical History:  Diagnosis Date  . Anxiety   . Arthritis    "legs" (09/20/2014)  . Depression   . GERD (gastroesophageal reflux disease)   . Hypertension   . Seizures ([place]) 1983   x1, S/P head injury  . Wears dentures    partial upper    Family History: Family History  Problem Relation Age of Onset  . COPD Mother   . Heart disease Father     Social History   Socioeconomic History  . Marital status: Single    Spouse name: Not on file  . Number of children: Not on file  . Years of education: Not on file  . Highest education level: Not on file  Occupational History  . Not on file  Social Needs  . Financial resource strain: Not on file  . Food insecurity:    Worry: Not on file    Inability:  Not on file  . Transportation needs:    Medical: Not on file    Non-medical: Not on file  Tobacco Use  . Smoking status: Current Every Day Smoker    Packs/day: 0.75    Years: 20.00    Pack years: 15.00    Types: Cigarettes  . Smokeless tobacco: Former Systems developer    Types: Chew  Substance and Sexual Activity  . Alcohol use: Not Currently    Alcohol/week: 7.2 oz    Types: 12 Cans of beer per week    Comment: havent drank in 1 yr  . Drug use: Yes    Types: Marijuana    Comment: 09/20/2014 "maybe once/month"  . Sexual activity: Not Currently  Lifestyle  . Physical activity:    Days per week: Not on file    Minutes per session: Not on file  . Stress: Not on  file  Relationships  . Social connections:    Talks on phone: Not on file    Gets together: Not on file    Attends religious service: Not on file    Active member of club or organization: Not on file    Attends meetings of clubs or organizations: Not on file    Relationship status: Not on file  . Intimate partner violence:    Fear of current or ex partner: Not on file    Emotionally abused: Not on file    Physically abused: Not on file    Forced sexual activity: Not on file  Other Topics Concern  . Not on file  Social History Narrative  . Not on file      Review of Systems  Constitutional: Negative for activity change, chills, fatigue and unexpected weight change.  HENT: Negative for congestion, postnasal drip, rhinorrhea, sneezing and sore throat.   Eyes: Negative.  Negative for redness.  Respiratory: Negative for cough, chest tightness, shortness of breath and wheezing.   Cardiovascular: Negative for chest pain and palpitations.  Gastrointestinal: Negative for abdominal pain, constipation, diarrhea, nausea and vomiting.  Endocrine: Negative for cold intolerance, heat intolerance, polydipsia, polyphagia and polyuria.  Genitourinary: Negative.  Negative for dysuria and frequency.  Musculoskeletal: Positive for arthralgias and myalgias. Negative for back pain, joint swelling and neck pain.       Generalized joint pain, most severe in the left knee.   Skin: Negative for rash.  Allergic/Immunologic: Negative for environmental allergies, food allergies and immunocompromised state.  Neurological: Negative for dizziness, tremors, numbness and headaches.  Hematological: Negative for adenopathy. Does not bruise/bleed easily.  Psychiatric/Behavioral: Negative for behavioral problems (Depression), sleep disturbance and suicidal ideas. The patient is nervous/anxious.     Today's Vitals   01/25/18 1041  BP: 118/84  Pulse: 69  Resp: 16  SpO2: 96%  Weight: 251 lb (113.9 kg)  Height:  6' (1.829 m)    Physical Exam  Constitutional: He is oriented to person, place, and time. He appears well-developed and well-nourished. No distress.  HENT:  Head: Normocephalic and atraumatic.  Nose: Nose normal.  Mouth/Throat: Oropharynx is clear and moist. No oropharyngeal exudate.  Eyes: Pupils are equal, round, and reactive to light. Conjunctivae and EOM are normal.  Neck: Normal range of motion. Neck supple. No JVD present. Carotid bruit is not present. No tracheal deviation present. No thyromegaly present.  Cardiovascular: Normal rate, regular rhythm and normal heart sounds. Exam reveals no gallop and no friction rub.  No murmur heard. Pulmonary/Chest: Effort normal and breath sounds normal. No respiratory distress.  He has no wheezes. He has no rales. He exhibits no tenderness.  Abdominal: Soft. Bowel sounds are normal. There is no tenderness.  Musculoskeletal:  Moderate lower back pain and generalized joint pain with point tenderness present. No visible or palpable abnormalities are noted at this time.   Lymphadenopathy:    He has no cervical adenopathy.  Neurological: He is alert and oriented to person, place, and time. No cranial nerve deficit. He exhibits normal muscle tone.  Skin: Skin is warm and dry. He is not diaphoretic.  Psychiatric: His speech is normal and behavior is normal. Judgment and thought content normal. His mood appears anxious. Cognition and memory are normal.  Nursing note and vitals reviewed.   Assessment/Plan:  1. Gastroesophageal reflux disease without esophagitis conitnue to take omeprazole 40mg   - omeprazole (PRILOSEC) 40 MG capsule; Take 1 capsule (40 mg total) by mouth daily.  Dispense: 30 capsule; Refill: 5  2. Chronic right shoulder pain Will refer patient to his orthopedic provider for further evaluation and treatment.  - Ambulatory referral to Orthopedic Surgery  3. Cramp and spasm May continue flexeril 10mg  up to three times daily if needed  for muscle spasms and cramping . - cyclobenzaprine (FLEXERIL) 10 MG tablet; Take 1 tablet (10 mg total) by mouth 3 (three) times daily as needed for muscle spasms.  Dispense: 90 tablet; Refill: 3  4. Chronic osteoarthritis May tramadol 50mg  as needed and as prescribed. A prescription for #20 tablets was sent to his pharmacy.  - traMADol (ULTRAM) 50 MG tablet; Take 1 tablet (50 mg total) by mouth every 8 (eight) hours as needed.  Dispense: 20 tablet; Refill: 0  5. GAD (generalized anxiety disorder) Alprazolam 0.5mg  may be taken up to three times daily if needed for acute anxiety.  - ALPRAZolam (XANAX) 0.5 MG tablet; Take 1 tablet (0.5 mg total) by mouth 3 (three) times daily as needed for anxiety.  Dispense: 90 tablet; Refill: 3 General Counseling: antawn sison understanding of the findings of todays visit and agrees with plan of treatment. I have discussed any further diagnostic evaluation that may be needed or ordered today. We also reviewed his medications today. he has been encouraged to call the office with any questions or concerns that should arise related to todays visit.    Counseling:  This patient was seen by Leretha Pol FNP Collaboration with Dr Lavera Guise as a part of collaborative care agreement  Orders Placed This Encounter  Procedures  . Ambulatory referral to Orthopedic Surgery    Meds ordered this encounter  Medications  . ALPRAZolam (XANAX) 0.5 MG tablet    Sig: Take 1 tablet (0.5 mg total) by mouth 3 (three) times daily as needed for anxiety.    Dispense:  90 tablet    Refill:  3    Order Specific Question:   Supervising Provider    Answer:   Lavera Guise [[id number]]  . cyclobenzaprine (FLEXERIL) 10 MG tablet    Sig: Take 1 tablet (10 mg total) by mouth 3 (three) times daily as needed for muscle spasms.    Dispense:  90 tablet    Refill:  3    Order Specific Question:   Supervising Provider    Answer:   Lavera Guise [[id number]]  . omeprazole (PRILOSEC) 40 MG  capsule    Sig: Take 1 capsule (40 mg total) by mouth daily.    Dispense:  30 capsule    Refill:  5    Order Specific  Question:   Supervising Provider    Answer:   Lavera Guise [[id number]]  . traMADol (ULTRAM) 50 MG tablet    Sig: Take 1 tablet (50 mg total) by mouth every 8 (eight) hours as needed.    Dispense:  20 tablet    Refill:  0    Order Specific Question:   Supervising Provider    Answer:   Lavera Guise [[id number]]    Time spent: 32 Minutes      Dr Lavera Guise Internal medicine

## 2018-02-14 DIAGNOSIS — M19011 Primary osteoarthritis, right shoulder: Secondary | ICD-10-CM | POA: Diagnosis not present

## 2018-02-16 DIAGNOSIS — M1990 Unspecified osteoarthritis, unspecified site: Secondary | ICD-10-CM | POA: Insufficient documentation

## 2018-02-16 DIAGNOSIS — M25511 Pain in right shoulder: Secondary | ICD-10-CM

## 2018-02-16 DIAGNOSIS — R252 Cramp and spasm: Secondary | ICD-10-CM | POA: Insufficient documentation

## 2018-02-16 DIAGNOSIS — G8929 Other chronic pain: Secondary | ICD-10-CM | POA: Insufficient documentation

## 2018-02-16 DIAGNOSIS — K219 Gastro-esophageal reflux disease without esophagitis: Secondary | ICD-10-CM | POA: Insufficient documentation

## 2018-02-16 DIAGNOSIS — F411 Generalized anxiety disorder: Secondary | ICD-10-CM | POA: Insufficient documentation

## 2018-04-27 ENCOUNTER — Other Ambulatory Visit: Payer: Self-pay | Admitting: Orthopedic Surgery

## 2018-04-27 DIAGNOSIS — M19011 Primary osteoarthritis, right shoulder: Secondary | ICD-10-CM

## 2018-05-03 ENCOUNTER — Ambulatory Visit
Admission: RE | Admit: 2018-05-03 | Discharge: 2018-05-03 | Disposition: A | Discharge status: Home / Self Care | Payer: Medicare Other | Source: Ambulatory Visit | Attending: Orthopedic Surgery | Admitting: Orthopedic Surgery

## 2018-05-03 DIAGNOSIS — M19011 Primary osteoarthritis, right shoulder: Secondary | ICD-10-CM | POA: Diagnosis not present

## 2018-05-04 DIAGNOSIS — M19011 Primary osteoarthritis, right shoulder: Secondary | ICD-10-CM | POA: Diagnosis not present

## 2018-05-25 ENCOUNTER — Ambulatory Visit (HOSPITAL_COMMUNITY)
Admission: RE | Admit: 2018-05-25 | Discharge: 2018-05-25 | Disposition: A | Discharge status: Home / Self Care | Payer: Medicare Other | Source: Ambulatory Visit | Attending: Orthopedic Surgery | Admitting: Orthopedic Surgery

## 2018-05-25 ENCOUNTER — Encounter (HOSPITAL_COMMUNITY)
Admission: RE | Admit: 2018-05-25 | Discharge: 2018-05-25 | Disposition: A | Discharge status: Home / Self Care | Payer: Medicare Other | Source: Ambulatory Visit | Attending: Orthopedic Surgery | Admitting: Orthopedic Surgery

## 2018-05-25 ENCOUNTER — Encounter (HOSPITAL_COMMUNITY): Payer: Self-pay

## 2018-05-25 ENCOUNTER — Other Ambulatory Visit: Payer: Self-pay

## 2018-05-25 DIAGNOSIS — M19011 Primary osteoarthritis, right shoulder: Secondary | ICD-10-CM | POA: Diagnosis not present

## 2018-05-25 DIAGNOSIS — A488 Other specified bacterial diseases: Secondary | ICD-10-CM

## 2018-05-25 DIAGNOSIS — Z01818 Encounter for other preprocedural examination: Secondary | ICD-10-CM | POA: Insufficient documentation

## 2018-05-25 DIAGNOSIS — Z01811 Encounter for preprocedural respiratory examination: Secondary | ICD-10-CM | POA: Diagnosis not present

## 2018-05-25 HISTORY — DX: Umbilical hernia without obstruction or gangrene: K42.9

## 2018-05-25 HISTORY — DX: Primary osteoarthritis, unspecified shoulder: M19.019

## 2018-05-25 HISTORY — DX: Other specified bacterial diseases: A48.8

## 2018-05-25 LAB — TYPE AND SCREEN
ABO/RH(D): AB NEG
Antibody Screen: NEGATIVE

## 2018-05-25 LAB — URINALYSIS, ROUTINE W REFLEX MICROSCOPIC
Bilirubin Urine: NEGATIVE
Glucose, UA: NEGATIVE mg/dL
Hgb urine dipstick: NEGATIVE
Ketones, ur: NEGATIVE mg/dL
LEUKOCYTES UA: NEGATIVE
Nitrite: NEGATIVE
Protein, ur: NEGATIVE mg/dL
SPECIFIC GRAVITY, URINE: 1.02 (ref 1.005–1.030)
pH: 5 (ref 5.0–8.0)

## 2018-05-25 LAB — CBC WITH DIFFERENTIAL/PLATELET
ABS IMMATURE GRANULOCYTES: 0.03 10*3/uL (ref 0.00–0.07)
Basophils Absolute: 0.1 10*3/uL (ref 0.0–0.1)
Basophils Relative: 1 %
EOS PCT: 6 %
Eosinophils Absolute: 0.5 10*3/uL (ref 0.0–0.5)
HEMATOCRIT: 49.1 % (ref 39.0–52.0)
HEMOGLOBIN: 15.6 g/dL (ref 13.0–17.0)
Immature Granulocytes: 0 %
LYMPHS ABS: 2.2 10*3/uL (ref 0.7–4.0)
LYMPHS PCT: 27 %
MCH: 29.2 pg (ref 26.0–34.0)
MCHC: 31.8 g/dL (ref 30.0–36.0)
MCV: 91.8 fL (ref 80.0–100.0)
MONO ABS: 0.5 10*3/uL (ref 0.1–1.0)
MONOS PCT: 6 %
NEUTROS ABS: 4.8 10*3/uL (ref 1.7–7.7)
Neutrophils Relative %: 60 %
Platelets: 235 10*3/uL (ref 150–400)
RBC: 5.35 MIL/uL (ref 4.22–5.81)
RDW: 13.3 % (ref 11.5–15.5)
WBC: 8 10*3/uL (ref 4.0–10.5)
nRBC: 0 % (ref 0.0–0.2)

## 2018-05-25 LAB — COMPREHENSIVE METABOLIC PANEL
ALK PHOS: 69 U/L (ref 38–126)
ALT: 19 U/L (ref 0–44)
AST: 26 U/L (ref 15–41)
Albumin: 3.8 g/dL (ref 3.5–5.0)
Anion gap: 6 (ref 5–15)
BILIRUBIN TOTAL: 0.7 mg/dL (ref 0.3–1.2)
BUN: 16 mg/dL (ref 6–20)
CALCIUM: 9.3 mg/dL (ref 8.9–10.3)
CO2: 26 mmol/L (ref 22–32)
CREATININE: 1.18 mg/dL (ref 0.61–1.24)
Chloride: 106 mmol/L (ref 98–111)
GFR calc non Af Amer: >60 mL/min (ref >60)
Glucose, Bld: 102 mg/dL — ABNORMAL HIGH (ref 70–99)
Potassium: 4.5 mmol/L (ref 3.5–5.1)
SODIUM: 138 mmol/L (ref 135–145)
TOTAL PROTEIN: 7 g/dL (ref 6.5–8.1)

## 2018-05-25 LAB — PROTIME-INR
INR: 1.02
Prothrombin Time: 13.3 seconds (ref 11.4–15.2)

## 2018-05-25 LAB — SURGICAL PCR SCREEN
MRSA, PCR: NEGATIVE
Staphylococcus aureus: NEGATIVE

## 2018-05-25 LAB — APTT: aPTT: 36 seconds (ref 24–36)

## 2018-05-25 NOTE — Progress Notes (Signed)
Pt denies SOB, chest pain, and being under the care of a cardiologist. Pt denies having a stress test, echo and cardiac cath. Pt denies having an EKG and chest x ray within the last year. Pt denies recent labs. Pt BP was elevated at PAT appointment; pt stated that he stopped taking his BP medications. Pt educated on the importance of taking his BP medication as prescribed, the dangers of stopping his BP medication abruptly without MD's knowledge and the possibility of having surgery canceled if  BP is high on DOS. Pt verbalized understanding of the importance of taking BP medications as prescribed and stated  " I will take it."  Pt stated that he smokes Marijuana and cigarettes daily. Pt educated on the importance of refraining from both Marijuana and tobacco use pre-operatively.  Pt verbalized understanding of all pre-op instructions. Pt chart forwarded to anesthesia for review.

## 2018-05-25 NOTE — Progress Notes (Signed)
   05/25/18 1006  OBSTRUCTIVE SLEEP APNEA  Have you ever been diagnosed with sleep apnea through a sleep study? No  Do you snore loudly (loud enough to be heard through closed doors)?  1  Do you often feel tired, fatigued, or sleepy during the daytime (such as falling asleep during driving or talking to someone)? 0  Has anyone observed you stop breathing during your sleep? 0  Do you have, or are you being treated for high blood pressure? 0  BMI more than 35 kg/m2? 1  Age > 50 (1-yes) 1  Neck circumference greater than:Male 16 inches or larger, Male 17inches or larger? 1  Male Gender (Yes=1) 1  Obstructive Sleep Apnea Score 5

## 2018-05-25 NOTE — Pre-Procedure Instructions (Signed)
   Zachary Holmes  05/25/2018     [id number], [place] [address] Phone: [phone] Fax: [phone]  [id number] [place], [place] - [address] [place] [address] Phone: [phone] Fax: [phone]   Your procedure is scheduled on Thursday, June 02, 2018  Report to [facility] Admitting at 5:30 A.M.  Call this number if you have problems the morning of surgery:  [phone]   Remember:  Do not eat or drink after midnight Wednesday, June 01, 2018  Take these medicines the morning of surgery with A SIP OF WATER : If needed: ALPRAZolam [name] for anxiety, omeprazole (PRILOSEC)  for acid reflux  Stop taking Aspirin, vitamins, fish oil and herbal medications. Do not take any NSAIDs ie: Ibuprofen, Advil, Naproxen (Aleve), Motrin, BC and Goody Powder; stop now.  Do not wear jewelry, make-up or nail polish.  Do not wear lotions, powders, or perfumes, or deodorant.  Do not shave 48 hours prior to surgery.  Men may shave face and neck.  Do not bring valuables to the hospital.  [facility] is not responsible for any belongings or valuables.  Contacts, dentures or bridgework may not be worn into surgery.  Leave your suitcase in the car.  After surgery it may be brought to your room. Special instructions: See " [place]-Preparing For Surgery " sheet. Please read over the following fact sheets that you were given. Pain Booklet, Coughing and Deep Breathing, MRSA Information and Surgical Site Infection Prevention

## 2018-05-26 ENCOUNTER — Encounter (HOSPITAL_COMMUNITY): Payer: Self-pay

## 2018-05-26 DIAGNOSIS — A488 Other specified bacterial diseases: Secondary | ICD-10-CM | POA: Insufficient documentation

## 2018-05-27 ENCOUNTER — Encounter: Payer: Self-pay | Admitting: Nurse Practitioner

## 2018-05-27 ENCOUNTER — Ambulatory Visit (INDEPENDENT_AMBULATORY_CARE_PROVIDER_SITE_OTHER): Payer: Medicare Other | Admitting: Nurse Practitioner

## 2018-05-27 VITALS — BP 132/99 | HR 88 | Resp 16 | Ht 72.0 in | Wt 259.0 lb

## 2018-05-27 DIAGNOSIS — G8929 Other chronic pain: Secondary | ICD-10-CM | POA: Diagnosis not present

## 2018-05-27 DIAGNOSIS — R252 Cramp and spasm: Secondary | ICD-10-CM | POA: Diagnosis not present

## 2018-05-27 DIAGNOSIS — M25511 Pain in right shoulder: Secondary | ICD-10-CM | POA: Diagnosis not present

## 2018-05-27 DIAGNOSIS — R300 Dysuria: Secondary | ICD-10-CM | POA: Insufficient documentation

## 2018-05-27 DIAGNOSIS — Z0001 Encounter for general adult medical examination with abnormal findings: Secondary | ICD-10-CM | POA: Insufficient documentation

## 2018-05-27 DIAGNOSIS — I10 Essential (primary) hypertension: Secondary | ICD-10-CM | POA: Diagnosis not present

## 2018-05-27 DIAGNOSIS — F411 Generalized anxiety disorder: Secondary | ICD-10-CM | POA: Diagnosis not present

## 2018-05-27 MED ORDER — ALPRAZOLAM 0.5 MG PO TABS: 0.5000 mg | ORAL_TABLET | Freq: Three times a day (TID) | ORAL | 3 refills | Status: DC | PRN

## 2018-05-27 NOTE — Progress Notes (Signed)
[facility] [place], [place] [address]  Internal MEDICINE  Office Visit Note  Patient Name: Zachary Holmes  [id number]  [id number]  Date of Service: 05/27/2018   Pt is here for routine health maintenance examination  Chief Complaint  Patient presents with  . Medical Management of Chronic Issues    4 month follow up     The patient suffers from generalized muscle spasms. Will take flexeril as needed, usually two to three times per day. Not working as well as it used to. WE have tried tizanidine 4 mg in the past and this just didn't work. He is scheduled to have right shoulder replacement on Thursday, 06/02/2018.     Current Medication: Outpatient Encounter Medications as of 05/27/2018  Medication Sig  . ALPRAZolam (XANAX) 0.5 MG tablet Take 1 tablet (0.5 mg total) by mouth 3 (three) times daily as needed for anxiety.  . cyclobenzaprine (FLEXERIL) 10 MG tablet Take 1 tablet (10 mg total) by mouth 3 (three) times daily as needed for muscle spasms. (Patient taking differently: Take 10 mg by mouth 2 (two) times daily. )  . omeprazole (PRILOSEC) 40 MG capsule Take 1 capsule (40 mg total) by mouth daily. (Patient taking differently: Take 40 mg by mouth daily as needed (GERD). )  . [DISCONTINUED] ALPRAZolam (XANAX) 0.5 MG tablet Take 1 tablet (0.5 mg total) by mouth 3 (three) times daily as needed for anxiety.  Marland Kitchen lisinopril (PRINIVIL,ZESTRIL) 20 MG tablet Take 1 tablet (20 mg total) by mouth daily. (Patient not taking: Reported on 05/20/2018)  . traMADol (ULTRAM) 50 MG tablet Take 1 tablet (50 mg total) by mouth every 8 (eight) hours as needed. (Patient not taking: Reported on 05/20/2018)   No facility-administered encounter medications on file as of 05/27/2018.     Surgical History: Past Surgical History:  Procedure Laterality Date  . ANKLE FUSION Right 11/24/2007   Archie Endo 12/04/2010  . COLONOSCOPY WITH PROPOFOL N/A 05/13/2017   Procedure: COLONOSCOPY  WITH PROPOFOL;  Surgeon: Lucilla Lame, MD;  Location: [place];  Service: Endoscopy;  Laterality: N/A;  requests early  . FRACTURE SURGERY    . JOINT REPLACEMENT    . KNEE ARTHROCENTESIS Left X 5   "got infected . . ."  . MULTIPLE TOOTH EXTRACTIONS    . SHOULDER ARTHROSCOPY Left 06/2007   Archie Endo 12/03/2010  . TOTAL KNEE ARTHROPLASTY Left 2008   Archie Endo 04/09/2010  . TOTAL SHOULDER ARTHROPLASTY Left 09/20/2014  . TOTAL SHOULDER ARTHROPLASTY Left 09/20/2014   Procedure: TOTAL SHOULDER ARTHROPLASTY;  Surgeon: Nita Sells, MD;  Location: [place];  Service: Orthopedics;  Laterality: Left;  Left total shoulder arthroplasty    Medical History: Past Medical History:  Diagnosis Date  . Anxiety   . Arthritis    "legs" (09/20/2014)  . Depression   . GERD (gastroesophageal reflux disease)   . Hypertension   . OA (osteoarthritis) of shoulder    right  . Seizures ([place]) 1983   x1, S/P head injury  . Serratia marcescens infection 2011   left knee  . Umbilical hernia   . Wears dentures    partial upper    Family History: Family History  Problem Relation Age of Onset  . COPD Mother   . Heart disease Father       Review of Systems  Constitutional: Negative for activity change, chills, fatigue and unexpected weight change.  HENT: Negative for congestion, postnasal drip, rhinorrhea, sneezing and sore throat.   Eyes:  Negative.  Negative for redness.  Respiratory: Negative for cough, chest tightness, shortness of breath and wheezing.   Cardiovascular: Negative for chest pain and palpitations.  Gastrointestinal: Negative for abdominal pain, constipation, diarrhea, nausea and vomiting.  Endocrine: Negative for cold intolerance, heat intolerance, polydipsia, polyphagia and polyuria.  Genitourinary: Negative.  Negative for dysuria and frequency.  Musculoskeletal: Positive for arthralgias and myalgias. Negative for back pain, joint swelling and neck pain.       Generalized  joint pain, most severe in the left knee. Patient scheduled to have right shoulder replacement on 06/02/2018  Skin: Negative for rash.  Allergic/Immunologic: Negative for environmental allergies, food allergies and immunocompromised state.  Neurological: Negative for dizziness, tremors, numbness and headaches.  Hematological: Negative for adenopathy. Does not bruise/bleed easily.  Psychiatric/Behavioral: Negative for behavioral problems (Depression), sleep disturbance and suicidal ideas. The patient is nervous/anxious.      Today's Vitals   05/27/18 0840  BP: (!) 132/99  Pulse: 88  Resp: 16  SpO2: 92%  Weight: 259 lb (117.5 kg)  Height: 6' (1.829 m)    Physical Exam  Constitutional: He is oriented to person, place, and time. He appears well-developed and well-nourished. No distress.  HENT:  Head: Normocephalic and atraumatic.  Nose: Nose normal.  Mouth/Throat: Oropharynx is clear and moist. No oropharyngeal exudate.  Eyes: Pupils are equal, round, and reactive to light. Conjunctivae and EOM are normal.  Neck: Normal range of motion. Neck supple. No JVD present. Carotid bruit is not present. No tracheal deviation present. No thyromegaly present.  Cardiovascular: Normal rate, regular rhythm, normal heart sounds and intact distal pulses. Exam reveals no gallop and no friction rub.  No murmur heard. Pulmonary/Chest: Effort normal and breath sounds normal. No respiratory distress. He has no wheezes. He has no rales. He exhibits no tenderness.  Abdominal: Soft. Bowel sounds are normal. There is no tenderness.  Musculoskeletal:  Moderate lower back pain and generalized joint pain with point tenderness present. No visible or palpable abnormalities are noted at this time. Right shoulder has moderate reduction of ROM and strength.   Lymphadenopathy:    He has no cervical adenopathy.  Neurological: He is alert and oriented to person, place, and time. No cranial nerve deficit. He exhibits  normal muscle tone.  Skin: Skin is warm and dry. He is not diaphoretic.  Psychiatric: His speech is normal and behavior is normal. Judgment and thought content normal. His mood appears anxious. Cognition and memory are normal.  Nursing note and vitals reviewed.  Depression screen [facility] 2/9 05/27/2018 01/25/2018 09/27/2017 09/27/2017 04/12/2015  Decreased Interest 0 0 1 0 0  Down, Depressed, Hopeless 0 0 1 0 0  PHQ - 2 Score 0 0 2 0 0  Altered sleeping - - 1 - -  Tired, decreased energy - - 1 - -  Change in appetite - - 0 - -  Feeling bad or failure about yourself  - - 2 - -  Trouble concentrating - - 3 - -  Moving slowly or fidgety/restless - - 1 - -  Suicidal thoughts - - 0 - -  PHQ-9 Score - - 10 - -  Difficult doing work/chores - - Somewhat difficult - -    Functional Status Survey: Is the patient deaf or have difficulty hearing?: No Does the patient have difficulty seeing, even when wearing glasses/contacts?: No Does the patient have difficulty concentrating, remembering, or making decisions?: No Does the patient have difficulty walking or climbing stairs?: No Does the  patient have difficulty dressing or bathing?: No Does the patient have difficulty doing errands alone such as visiting a doctor's office or shopping?: No  MMSE - [place] Exam 05/27/2018  Orientation to time 5  Orientation to Place 5  Registration 3  Attention/ Calculation 5  Recall 3  Language- name 2 objects 2  Language- repeat 1  Language- follow 3 step command 3  Language- read & follow direction 1  Write a sentence 1  Copy design 1  Total score 30    Fall Risk  05/27/2018 01/25/2018 09/27/2017 09/27/2017 04/12/2015  Falls in the past year? Yes Yes - Yes Yes  Number falls in past yr: 2 or more 2 or more 2 or more 2 or more -  Injury with Fall? No No No No Yes  Comment - - - - swollen knee, jammed shoulder  Risk for fall due to : - - - - -  Follow up - - - - (No Data)  Comment - - - - knee gave  out, tripped and fell     LABS: Recent Results (from the past 2160 hour(s))  Surgical pcr screen     Status: None   Collection Time: 05/25/18 10:37 AM  Result Value Ref Range   MRSA, PCR NEGATIVE NEGATIVE   Staphylococcus aureus NEGATIVE NEGATIVE    Comment: (NOTE) The Xpert SA Assay (FDA approved for NASAL specimens in patients 4 years of age and older), is one component of a comprehensive surveillance program. It is not intended to diagnose infection nor to guide or monitor treatment. Performed at [facility], [place] [address] [place], [place] [address]   APTT     Status: None   Collection Time: 05/25/18 10:38 AM  Result Value Ref Range   aPTT 36 24 - 36 seconds    Comment: Performed at [facility] [address], [place], [place] [address]  CBC WITH DIFFERENTIAL     Status: None   Collection Time: 05/25/18 10:38 AM  Result Value Ref Range   WBC 8.0 4.0 - 10.5 K/uL   RBC 5.35 4.22 - 5.81 MIL/uL   Hemoglobin 15.6 13.0 - 17.0 g/dL   HCT 49.1 39.0 - 52.0 %   MCV 91.8 80.0 - 100.0 fL   MCH 29.2 26.0 - 34.0 pg   MCHC 31.8 30.0 - 36.0 g/dL   RDW 13.3 11.5 - 15.5 %   Platelets 235 150 - 400 K/uL   nRBC 0.0 0.0 - 0.2 %   Neutrophils Relative % 60 %   Neutro Abs 4.8 1.7 - 7.7 K/uL   Lymphocytes Relative 27 %   Lymphs Abs 2.2 0.7 - 4.0 K/uL   Monocytes Relative 6 %   Monocytes Absolute 0.5 0.1 - 1.0 K/uL   Eosinophils Relative 6 %   Eosinophils Absolute 0.5 0.0 - 0.5 K/uL   Basophils Relative 1 %   Basophils Absolute 0.1 0.0 - 0.1 K/uL   Immature Granulocytes 0 %   Abs Immature Granulocytes 0.03 0.00 - 0.07 K/uL    Comment: Performed at [facility], [address] [place], [place] [address]  Comprehensive metabolic panel     Status: Abnormal   Collection Time: 05/25/18 10:38 AM  Result Value Ref Range   Sodium 138 135 - 145 mmol/L   Potassium 4.5 3.5 - 5.1 mmol/L   Chloride 106 98 - 111 mmol/L   CO2 26 22 - 32 mmol/L   Glucose, Bld  102 (H) 70 -  99 mg/dL   BUN 16 6 - 20 mg/dL   Creatinine, Ser 1.18 0.61 - 1.24 mg/dL   Calcium 9.3 8.9 - 10.3 mg/dL   Total Protein 7.0 6.5 - 8.1 g/dL   Albumin 3.8 3.5 - 5.0 g/dL   AST 26 15 - 41 U/L   ALT 19 0 - 44 U/L   Alkaline Phosphatase 69 38 - 126 U/L   Total Bilirubin 0.7 0.3 - 1.2 mg/dL   GFR calc non Af Amer >60 >60 mL/min   GFR calc Af Amer >60 >60 mL/min    Comment: (NOTE) The eGFR has been calculated using the CKD EPI equation. This calculation has not been validated in all clinical situations. eGFR's persistently <60 mL/min signify possible Chronic Kidney Disease.    Anion gap 6 5 - 15    Comment: Performed at [facility] [address], [place], [place] [address]  Protime-INR     Status: None   Collection Time: 05/25/18 10:38 AM  Result Value Ref Range   Prothrombin Time 13.3 11.4 - 15.2 seconds   INR 1.02     Comment: Performed at [facility] [address], [place], [place] [address]  Urinalysis, Routine w reflex microscopic     Status: None   Collection Time: 05/25/18 10:39 AM  Result Value Ref Range   Color, Urine YELLOW YELLOW   APPearance CLEAR CLEAR   Specific Gravity, Urine 1.020 1.005 - 1.030   pH 5.0 5.0 - 8.0   Glucose, UA NEGATIVE NEGATIVE mg/dL   Hgb urine dipstick NEGATIVE NEGATIVE   Bilirubin Urine NEGATIVE NEGATIVE   Ketones, ur NEGATIVE NEGATIVE mg/dL   Protein, ur NEGATIVE NEGATIVE mg/dL   Nitrite NEGATIVE NEGATIVE   Leukocytes, UA NEGATIVE NEGATIVE    Comment: Performed at [facility] [address] [place], [place] [address]  Type and screen Order type and screen if day of surgery is less than 15 days from draw of preadmission visit or order morning of surgery if day of surgery is greater than 6 days from preadmission visit.     Status: None   Collection Time: 05/25/18 10:52 AM  Result Value Ref Range   ABO/RH(D) AB NEG    Antibody Screen NEG    Sample Expiration 06/08/2018    Extend sample reason      NO TRANSFUSIONS OR  PREGNANCY IN THE PAST 3 MONTHS Performed at [facility], [place] [address] [place], [place] [address]    Assessment/Plan: 1. Encounter for general adult medical examination with abnormal findings Annual health maintenance exam today. A lab slip was given for him to have rotuine, fasting labs drawn prior to next visit. Will review lab results then.   2. Essential hypertension Stable. Continue bp medication as prescribed   3. Chronic right shoulder pain Scheduled for right shoulder replacement on 06/02/2018  4. GAD (generalized anxiety disorder) May continue alprazolam 0.70m up to three times daily as needed for acute anxiety. New prescription sent to the pharmacy.  - ALPRAZolam (Duanne Moron 0.5 MG tablet; Take 1 tablet (0.5 mg total) by mouth 3 (three) times daily as needed for anxiety.  Dispense: 90 tablet; Refill: 3  5. Cramp and spasm May continue flexeril as needed and as prescribed   6. Dysuria - UA/M w/rflx Culture, Routine General Counseling: Mdaejon lichunderstanding of the findings of todays visit and agrees with plan of treatment. I have discussed any further diagnostic  evaluation that may be needed or ordered today. We also reviewed his medications today. he has been encouraged to call the office with any questions or concerns that should arise related to todays visit.    Counseling:  Hypertension Counseling:   The following hypertensive lifestyle modification were recommended and discussed:  1. Limiting alcohol intake to less than 1 oz/day of ethanol:(24 oz of beer or 8 oz of wine or 2 oz of 100-proof whiskey). 2. Take baby ASA 81 mg daily. 3. Importance of regular aerobic exercise and losing weight. 4. Reduce dietary saturated fat and cholesterol intake for overall cardiovascular health. 5. Maintaining adequate dietary potassium, calcium, and magnesium intake. 6. Regular monitoring of the blood pressure. 7. Reduce sodium intake to less than 100 mmol/day (less  than 2.3 gm of sodium or less than 6 gm of sodium choride)   This patient was seen by [place] with Dr Lavera Guise as a part of collaborative care agreement  Orders Placed This Encounter  Procedures  . UA/M w/rflx Culture, Routine    Meds ordered this encounter  Medications  . ALPRAZolam (XANAX) 0.5 MG tablet    Sig: Take 1 tablet (0.5 mg total) by mouth 3 (three) times daily as needed for anxiety.    Dispense:  90 tablet    Refill:  3    Order Specific Question:   Supervising Provider    Answer:   Lavera Guise [[id number]]    Time spent: [place], MD  Internal Medicine

## 2018-05-28 LAB — UA/M W/RFLX CULTURE, ROUTINE
Bilirubin, UA: NEGATIVE
Glucose, UA: NEGATIVE
Ketones, UA: NEGATIVE
Leukocytes, UA: NEGATIVE
Nitrite, UA: NEGATIVE
Protein, UA: NEGATIVE
RBC, UA: NEGATIVE
Specific Gravity, UA: 1.015 (ref 1.005–1.030)
Urobilinogen, Ur: 0.2 mg/dL (ref 0.2–1.0)
pH, UA: 5 (ref 5.0–7.5)

## 2018-05-28 LAB — MICROSCOPIC EXAMINATION
BACTERIA UA: NONE SEEN
Casts: NONE SEEN /lpf
EPITHELIAL CELLS (NON RENAL): NONE SEEN /HPF (ref 0–10)
RBC MICROSCOPIC, UA: NONE SEEN /HPF (ref 0–2)

## 2018-06-01 MED ORDER — DEXTROSE 5 % IV SOLN
3.0000 g | INTRAVENOUS | Status: AC
  Administered 2018-06-02: 3 g via INTRAVENOUS
  Filled 2018-06-01: qty 3

## 2018-06-01 MED ORDER — TRANEXAMIC ACID-NACL 1000-0.7 MG/100ML-% IV SOLN
1000.0000 mg | INTRAVENOUS | Status: AC
  Administered 2018-06-02: 1000 mg via INTRAVENOUS
  Filled 2018-06-01: qty 100

## 2018-06-02 ENCOUNTER — Inpatient Hospital Stay (HOSPITAL_COMMUNITY)
Admission: RE | Admit: 2018-06-02 | Discharge: 2018-06-03 | DRG: 483 | Disposition: A | Discharge status: Home / Self Care | Payer: Medicare Other | Source: Ambulatory Visit | Attending: Orthopedic Surgery | Admitting: Orthopedic Surgery

## 2018-06-02 ENCOUNTER — Inpatient Hospital Stay (HOSPITAL_COMMUNITY): Payer: Medicare Other | Admitting: Physician Assistant

## 2018-06-02 ENCOUNTER — Other Ambulatory Visit: Payer: Self-pay

## 2018-06-02 ENCOUNTER — Encounter (HOSPITAL_COMMUNITY): Payer: Self-pay | Admitting: *Deleted

## 2018-06-02 ENCOUNTER — Inpatient Hospital Stay (HOSPITAL_COMMUNITY): Payer: Medicare Other

## 2018-06-02 ENCOUNTER — Encounter (HOSPITAL_COMMUNITY)
Admission: RE | Disposition: A | Discharge status: Home / Self Care | Payer: Self-pay | Source: Ambulatory Visit | Attending: Orthopedic Surgery

## 2018-06-02 ENCOUNTER — Inpatient Hospital Stay (HOSPITAL_COMMUNITY): Payer: Medicare Other | Admitting: Certified Registered Nurse Anesthetist

## 2018-06-02 DIAGNOSIS — Z96611 Presence of right artificial shoulder joint: Secondary | ICD-10-CM

## 2018-06-02 DIAGNOSIS — Z981 Arthrodesis status: Secondary | ICD-10-CM | POA: Diagnosis not present

## 2018-06-02 DIAGNOSIS — I10 Essential (primary) hypertension: Secondary | ICD-10-CM | POA: Diagnosis present

## 2018-06-02 DIAGNOSIS — K429 Umbilical hernia without obstruction or gangrene: Secondary | ICD-10-CM | POA: Diagnosis present

## 2018-06-02 DIAGNOSIS — I973 Postprocedural hypertension: Secondary | ICD-10-CM | POA: Diagnosis not present

## 2018-06-02 DIAGNOSIS — Z972 Presence of dental prosthetic device (complete) (partial): Secondary | ICD-10-CM | POA: Diagnosis not present

## 2018-06-02 DIAGNOSIS — Z96612 Presence of left artificial shoulder joint: Secondary | ICD-10-CM | POA: Diagnosis present

## 2018-06-02 DIAGNOSIS — F419 Anxiety disorder, unspecified: Secondary | ICD-10-CM | POA: Diagnosis present

## 2018-06-02 DIAGNOSIS — Z79899 Other long term (current) drug therapy: Secondary | ICD-10-CM | POA: Diagnosis not present

## 2018-06-02 DIAGNOSIS — Z8249 Family history of ischemic heart disease and other diseases of the circulatory system: Secondary | ICD-10-CM | POA: Diagnosis not present

## 2018-06-02 DIAGNOSIS — M19011 Primary osteoarthritis, right shoulder: Secondary | ICD-10-CM | POA: Diagnosis present

## 2018-06-02 DIAGNOSIS — Z825 Family history of asthma and other chronic lower respiratory diseases: Secondary | ICD-10-CM

## 2018-06-02 DIAGNOSIS — Z79891 Long term (current) use of opiate analgesic: Secondary | ICD-10-CM | POA: Diagnosis not present

## 2018-06-02 DIAGNOSIS — Z96652 Presence of left artificial knee joint: Secondary | ICD-10-CM | POA: Diagnosis present

## 2018-06-02 DIAGNOSIS — F329 Major depressive disorder, single episode, unspecified: Secondary | ICD-10-CM | POA: Diagnosis present

## 2018-06-02 DIAGNOSIS — F17210 Nicotine dependence, cigarettes, uncomplicated: Secondary | ICD-10-CM | POA: Diagnosis present

## 2018-06-02 DIAGNOSIS — G8918 Other acute postprocedural pain: Secondary | ICD-10-CM | POA: Diagnosis not present

## 2018-06-02 DIAGNOSIS — K219 Gastro-esophageal reflux disease without esophagitis: Secondary | ICD-10-CM | POA: Diagnosis present

## 2018-06-02 HISTORY — PX: TOTAL SHOULDER ARTHROPLASTY: SHX126

## 2018-06-02 SURGERY — ARTHROPLASTY, SHOULDER, TOTAL
Anesthesia: Regional | Laterality: Right

## 2018-06-02 SURGERY — Surgical Case
Anesthesia: *Unknown

## 2018-06-02 MED ORDER — PROPOFOL 10 MG/ML IV BOLUS
INTRAVENOUS | Status: DC | PRN
  Administered 2018-06-02: 200 mg via INTRAVENOUS

## 2018-06-02 MED ORDER — SODIUM CHLORIDE 0.9 % IV SOLN
INTRAVENOUS | Status: DC
  Administered 2018-06-02: 16:00:00 via INTRAVENOUS

## 2018-06-02 MED ORDER — METHOCARBAMOL 500 MG PO TABS
ORAL_TABLET | ORAL | Status: AC
  Filled 2018-06-02: qty 1

## 2018-06-02 MED ORDER — METOCLOPRAMIDE HCL 5 MG/ML IJ SOLN: 5.0000 mg | Freq: Three times a day (TID) | INTRAMUSCULAR | Status: DC | PRN

## 2018-06-02 MED ORDER — PROPOFOL 10 MG/ML IV BOLUS
INTRAVENOUS | Status: AC
  Filled 2018-06-02: qty 20

## 2018-06-02 MED ORDER — OXYCODONE-ACETAMINOPHEN 5-325 MG PO TABS: 1.0000 | ORAL_TABLET | ORAL | 0 refills | Status: DC | PRN

## 2018-06-02 MED ORDER — ONDANSETRON HCL 4 MG/2ML IJ SOLN
INTRAMUSCULAR | Status: DC | PRN
  Administered 2018-06-02: 4 mg via INTRAVENOUS

## 2018-06-02 MED ORDER — BUPIVACAINE HCL (PF) 0.5 % IJ SOLN
INTRAMUSCULAR | Status: DC | PRN
  Administered 2018-06-02: 20 mL via PERINEURAL

## 2018-06-02 MED ORDER — BISACODYL 5 MG PO TBEC: 5.0000 mg | DELAYED_RELEASE_TABLET | Freq: Every day | ORAL | Status: DC | PRN

## 2018-06-02 MED ORDER — ROCURONIUM BROMIDE 50 MG/5ML IV SOSY
PREFILLED_SYRINGE | INTRAVENOUS | Status: AC
  Filled 2018-06-02: qty 5

## 2018-06-02 MED ORDER — 0.9 % SODIUM CHLORIDE (POUR BTL) OPTIME
TOPICAL | Status: DC | PRN
  Administered 2018-06-02 (×2): 1000 mL

## 2018-06-02 MED ORDER — FENTANYL CITRATE (PF) 250 MCG/5ML IJ SOLN
INTRAMUSCULAR | Status: DC | PRN
  Administered 2018-06-02: 100 ug via INTRAVENOUS
  Administered 2018-06-02 (×2): 50 ug via INTRAVENOUS
  Administered 2018-06-02: 100 ug via INTRAVENOUS
  Administered 2018-06-02 (×2): 25 ug via INTRAVENOUS
  Administered 2018-06-02: 50 ug via INTRAVENOUS

## 2018-06-02 MED ORDER — FENTANYL CITRATE (PF) 250 MCG/5ML IJ SOLN
INTRAMUSCULAR | Status: AC
  Filled 2018-06-02: qty 5

## 2018-06-02 MED ORDER — LIDOCAINE 2% (20 MG/ML) 5 ML SYRINGE
INTRAMUSCULAR | Status: AC
  Filled 2018-06-02: qty 5

## 2018-06-02 MED ORDER — PANTOPRAZOLE SODIUM 40 MG PO TBEC
40.0000 mg | DELAYED_RELEASE_TABLET | Freq: Every day | ORAL | Status: DC
  Administered 2018-06-02 – 2018-06-03 (×2): 40 mg via ORAL
  Filled 2018-06-02 (×2): qty 1

## 2018-06-02 MED ORDER — SENNOSIDES-DOCUSATE SODIUM 8.6-50 MG PO TABS: 1.0000 | ORAL_TABLET | Freq: Every evening | ORAL | Status: DC | PRN

## 2018-06-02 MED ORDER — MENTHOL 3 MG MT LOZG: 1.0000 | LOZENGE | OROMUCOSAL | Status: DC | PRN

## 2018-06-02 MED ORDER — DOCUSATE SODIUM 100 MG PO CAPS
100.0000 mg | ORAL_CAPSULE | Freq: Two times a day (BID) | ORAL | Status: DC
  Administered 2018-06-02 – 2018-06-03 (×3): 100 mg via ORAL
  Filled 2018-06-02 (×3): qty 1

## 2018-06-02 MED ORDER — SUGAMMADEX SODIUM 200 MG/2ML IV SOLN
INTRAVENOUS | Status: DC | PRN
  Administered 2018-06-02: 200 mg via INTRAVENOUS

## 2018-06-02 MED ORDER — LACTATED RINGERS IV SOLN
INTRAVENOUS | Status: DC | PRN
  Administered 2018-06-02 (×2): via INTRAVENOUS

## 2018-06-02 MED ORDER — OXYCODONE HCL 5 MG PO TABS
10.0000 mg | ORAL_TABLET | ORAL | Status: DC | PRN
  Administered 2018-06-03 (×2): 15 mg via ORAL
  Filled 2018-06-02 (×2): qty 3

## 2018-06-02 MED ORDER — ZOLPIDEM TARTRATE 5 MG PO TABS
5.0000 mg | ORAL_TABLET | Freq: Every evening | ORAL | Status: DC | PRN
  Administered 2018-06-02: 5 mg via ORAL
  Filled 2018-06-02: qty 1

## 2018-06-02 MED ORDER — DEXAMETHASONE SODIUM PHOSPHATE 10 MG/ML IJ SOLN
INTRAMUSCULAR | Status: DC | PRN
  Administered 2018-06-02: 10 mg via INTRAVENOUS

## 2018-06-02 MED ORDER — POVIDONE-IODINE 7.5 % EX SOLN
Freq: Once | CUTANEOUS | Status: DC
  Filled 2018-06-02: qty 118

## 2018-06-02 MED ORDER — ALPRAZOLAM 0.5 MG PO TABS: 0.5000 mg | ORAL_TABLET | Freq: Three times a day (TID) | ORAL | Status: DC | PRN

## 2018-06-02 MED ORDER — CEFAZOLIN SODIUM-DEXTROSE 2-4 GM/100ML-% IV SOLN
2.0000 g | Freq: Four times a day (QID) | INTRAVENOUS | Status: AC
  Administered 2018-06-02 – 2018-06-03 (×2): 2 g via INTRAVENOUS
  Filled 2018-06-02 (×3): qty 100

## 2018-06-02 MED ORDER — ONDANSETRON HCL 4 MG/2ML IJ SOLN
INTRAMUSCULAR | Status: AC
  Filled 2018-06-02: qty 2

## 2018-06-02 MED ORDER — ROCURONIUM BROMIDE 100 MG/10ML IV SOLN
INTRAVENOUS | Status: DC | PRN
  Administered 2018-06-02: 50 mg via INTRAVENOUS

## 2018-06-02 MED ORDER — ALUMINUM HYDROXIDE GEL 320 MG/5ML PO SUSP
15.0000 mL | ORAL | Status: DC | PRN
  Filled 2018-06-02: qty 1500

## 2018-06-02 MED ORDER — MIDAZOLAM HCL 2 MG/2ML IJ SOLN
INTRAMUSCULAR | Status: AC
  Filled 2018-06-02: qty 2

## 2018-06-02 MED ORDER — ONDANSETRON HCL 4 MG PO TABS: 4.0000 mg | ORAL_TABLET | Freq: Four times a day (QID) | ORAL | Status: DC | PRN

## 2018-06-02 MED ORDER — ASPIRIN EC 81 MG PO TBEC
81.0000 mg | DELAYED_RELEASE_TABLET | Freq: Two times a day (BID) | ORAL | Status: DC
  Administered 2018-06-03: 81 mg via ORAL
  Filled 2018-06-02 (×2): qty 1

## 2018-06-02 MED ORDER — ACETAMINOPHEN 325 MG PO TABS: 325.0000 mg | ORAL_TABLET | Freq: Four times a day (QID) | ORAL | Status: DC | PRN

## 2018-06-02 MED ORDER — METOCLOPRAMIDE HCL 5 MG PO TABS: 5.0000 mg | ORAL_TABLET | Freq: Three times a day (TID) | ORAL | Status: DC | PRN

## 2018-06-02 MED ORDER — METHOCARBAMOL 500 MG PO TABS
500.0000 mg | ORAL_TABLET | Freq: Four times a day (QID) | ORAL | Status: DC | PRN
  Administered 2018-06-02 – 2018-06-03 (×4): 500 mg via ORAL
  Filled 2018-06-02 (×3): qty 1

## 2018-06-02 MED ORDER — DEXAMETHASONE SODIUM PHOSPHATE 10 MG/ML IJ SOLN
INTRAMUSCULAR | Status: AC
  Filled 2018-06-02: qty 2

## 2018-06-02 MED ORDER — METHOCARBAMOL 1000 MG/10ML IJ SOLN
500.0000 mg | Freq: Four times a day (QID) | INTRAVENOUS | Status: DC | PRN
  Filled 2018-06-02: qty 5

## 2018-06-02 MED ORDER — LIDOCAINE HCL (CARDIAC) PF 100 MG/5ML IV SOSY
PREFILLED_SYRINGE | INTRAVENOUS | Status: DC | PRN
  Administered 2018-06-02: 60 mg via INTRAVENOUS

## 2018-06-02 MED ORDER — OXYCODONE HCL 5 MG PO TABS
5.0000 mg | ORAL_TABLET | ORAL | Status: DC | PRN
  Administered 2018-06-02 (×2): 10 mg via ORAL
  Filled 2018-06-02: qty 2

## 2018-06-02 MED ORDER — BUPIVACAINE LIPOSOME 1.3 % IJ SUSP
INTRAMUSCULAR | Status: DC | PRN
  Administered 2018-06-02: 10 mL via PERINEURAL

## 2018-06-02 MED ORDER — DIPHENHYDRAMINE HCL 12.5 MG/5ML PO ELIX: 12.5000 mg | ORAL_SOLUTION | ORAL | Status: DC | PRN

## 2018-06-02 MED ORDER — MIDAZOLAM HCL 5 MG/5ML IJ SOLN
INTRAMUSCULAR | Status: DC | PRN
  Administered 2018-06-02: 2 mg via INTRAVENOUS

## 2018-06-02 MED ORDER — ONDANSETRON HCL 4 MG/2ML IJ SOLN: 4.0000 mg | Freq: Four times a day (QID) | INTRAMUSCULAR | Status: DC | PRN

## 2018-06-02 MED ORDER — PHENYLEPHRINE 40 MCG/ML (10ML) SYRINGE FOR IV PUSH (FOR BLOOD PRESSURE SUPPORT)
PREFILLED_SYRINGE | INTRAVENOUS | Status: AC
  Filled 2018-06-02: qty 10

## 2018-06-02 MED ORDER — PHENOL 1.4 % MT LIQD: 1.0000 | OROMUCOSAL | Status: DC | PRN

## 2018-06-02 MED ORDER — EPHEDRINE 5 MG/ML INJ
INTRAVENOUS | Status: AC
  Filled 2018-06-02: qty 10

## 2018-06-02 MED ORDER — ACETAMINOPHEN 500 MG PO TABS
1000.0000 mg | ORAL_TABLET | Freq: Four times a day (QID) | ORAL | Status: DC
  Administered 2018-06-02 – 2018-06-03 (×3): 1000 mg via ORAL
  Filled 2018-06-02 (×3): qty 2

## 2018-06-02 MED ORDER — OXYCODONE HCL 5 MG PO TABS
ORAL_TABLET | ORAL | Status: AC
  Filled 2018-06-02: qty 2

## 2018-06-02 MED ORDER — HYDROMORPHONE HCL 1 MG/ML IJ SOLN: 0.5000 mg | INTRAMUSCULAR | Status: DC | PRN

## 2018-06-02 MED ORDER — SUCCINYLCHOLINE CHLORIDE 200 MG/10ML IV SOSY
PREFILLED_SYRINGE | INTRAVENOUS | Status: AC
  Filled 2018-06-02: qty 10

## 2018-06-02 MED ORDER — SODIUM CHLORIDE 0.9 % IV SOLN
INTRAVENOUS | Status: DC | PRN
  Administered 2018-06-02: 15 ug/min via INTRAVENOUS

## 2018-06-02 MED ORDER — EPHEDRINE SULFATE 50 MG/ML IJ SOLN
INTRAMUSCULAR | Status: DC | PRN
  Administered 2018-06-02 (×3): 10 mg via INTRAVENOUS
  Administered 2018-06-02: 5 mg via INTRAVENOUS

## 2018-06-02 MED ORDER — SODIUM CHLORIDE 0.9 % IR SOLN
Status: DC | PRN
  Administered 2018-06-02: 3000 mL

## 2018-06-02 SURGICAL SUPPLY — 74 items
AID PSTN UNV HD RSTRNT DISP (MISCELLANEOUS) ×1
BIT DRILL 5/64X5 DISP (BIT) ×3 IMPLANT
BLADE SAW SAG 73X25 THK (BLADE) ×2
BLADE SAW SGTL 73X25 THK (BLADE) ×1 IMPLANT
BLADE SURG 15 STRL LF DISP TIS (BLADE) ×1 IMPLANT
BLADE SURG 15 STRL SS (BLADE) ×3
BOWL SMART MIX CTS (DISPOSABLE) IMPLANT
CEMENT BONE DEPUY (Cement) ×3 IMPLANT
CHLORAPREP W/TINT 26ML (MISCELLANEOUS) ×3 IMPLANT
CLOSURE STERI-STRIP 1/2X4 (GAUZE/BANDAGES/DRESSINGS) ×1
CLOSURE WOUND 1/2 X4 (GAUZE/BANDAGES/DRESSINGS) ×1
CLSR STERI-STRIP ANTIMIC 1/2X4 (GAUZE/BANDAGES/DRESSINGS) ×1 IMPLANT
COVER SURGICAL LIGHT HANDLE (MISCELLANEOUS) ×3 IMPLANT
COVER WAND RF STERILE (DRAPES) ×3 IMPLANT
DRAPE INCISE IOBAN 66X45 STRL (DRAPES) ×3 IMPLANT
DRAPE ORTHO SPLIT 77X108 STRL (DRAPES) ×6
DRAPE SURG 17X23 STRL (DRAPES) ×3 IMPLANT
DRAPE SURG ORHT 6 SPLT 77X108 (DRAPES) ×2 IMPLANT
DRAPE U-SHAPE 47X51 STRL (DRAPES) ×3 IMPLANT
DRSG AQUACEL AG ADV 3.5X 6 (GAUZE/BANDAGES/DRESSINGS) IMPLANT
DRSG AQUACEL AG ADV 3.5X10 (GAUZE/BANDAGES/DRESSINGS) ×2 IMPLANT
ELECT BLADE 4.0 EZ CLEAN MEGAD (MISCELLANEOUS)
ELECT REM PT RETURN 9FT ADLT (ELECTROSURGICAL) ×3
ELECTRODE BLDE 4.0 EZ CLN MEGD (MISCELLANEOUS) IMPLANT
ELECTRODE REM PT RTRN 9FT ADLT (ELECTROSURGICAL) ×1 IMPLANT
GLENOID CORTILOC AEQUALIS  L60 (Shoulder) ×2 IMPLANT
GLENOID CORTILOC AEQUALIS L60 (Shoulder) IMPLANT
GLOVE BIO SURGEON STRL SZ7 (GLOVE) ×3 IMPLANT
GLOVE BIO SURGEON STRL SZ7.5 (GLOVE) ×3 IMPLANT
GLOVE BIOGEL PI IND STRL 7.0 (GLOVE) ×1 IMPLANT
GLOVE BIOGEL PI IND STRL 8 (GLOVE) ×1 IMPLANT
GLOVE BIOGEL PI INDICATOR 7.0 (GLOVE) ×2
GLOVE BIOGEL PI INDICATOR 8 (GLOVE) ×2
GOWN STRL REUS W/ TWL LRG LVL3 (GOWN DISPOSABLE) ×1 IMPLANT
GOWN STRL REUS W/ TWL XL LVL3 (GOWN DISPOSABLE) ×1 IMPLANT
GOWN STRL REUS W/TWL LRG LVL3 (GOWN DISPOSABLE) ×3
GOWN STRL REUS W/TWL XL LVL3 (GOWN DISPOSABLE) ×3
GUIDEWIRE GLENOID 2.5X220 (WIRE) ×2 IMPLANT
HANDPIECE INTERPULSE COAX TIP (DISPOSABLE) ×3
HEAD HUM AEQUALIS 52X19 (Head) ×2 IMPLANT
HEMOSTAT SURGICEL 2X14 (HEMOSTASIS) ×3 IMPLANT
HOOD PEEL AWAY FLYTE STAYCOOL (MISCELLANEOUS) ×6 IMPLANT
KIT BASIN OR (CUSTOM PROCEDURE TRAY) ×3 IMPLANT
KIT TURNOVER KIT B (KITS) ×3 IMPLANT
MANIFOLD NEPTUNE II (INSTRUMENTS) ×3 IMPLANT
NDL MAYO TROCAR (NEEDLE) ×1 IMPLANT
NEEDLE MAYO TROCAR (NEEDLE) ×6 IMPLANT
NS IRRIG 1000ML POUR BTL (IV SOLUTION) ×5 IMPLANT
PACK SHOULDER (CUSTOM PROCEDURE TRAY) ×3 IMPLANT
PAD ARMBOARD 7.5X6 YLW CONV (MISCELLANEOUS) ×6 IMPLANT
RESTRAINT HEAD UNIVERSAL NS (MISCELLANEOUS) ×3 IMPLANT
RETRIEVER SUT HEWSON (MISCELLANEOUS) ×3 IMPLANT
SET HNDPC FAN SPRY TIP SCT (DISPOSABLE) ×1 IMPLANT
SLING ARM FOAM STRAP LRG (SOFTGOODS) ×3 IMPLANT
SLING ARM FOAM STRAP MED (SOFTGOODS) IMPLANT
SLING ARM IMMOBILIZER LRG (SOFTGOODS) ×2 IMPLANT
SMARTMIX MINI TOWER (MISCELLANEOUS) ×3
SPONGE LAP 18X18 X RAY DECT (DISPOSABLE) ×3 IMPLANT
SPONGE LAP 4X18 RFD (DISPOSABLE) ×2 IMPLANT
STEM HUMERAL FLEX 4C (Stem) ×2 IMPLANT
STRIP CLOSURE SKIN 1/2X4 (GAUZE/BANDAGES/DRESSINGS) ×2 IMPLANT
SUCTION FRAZIER HANDLE 10FR (MISCELLANEOUS) ×2
SUCTION TUBE FRAZIER 10FR DISP (MISCELLANEOUS) ×1 IMPLANT
SUPPORT WRAP ARM LG (MISCELLANEOUS) ×3 IMPLANT
SUT ETHIBOND NAB CT1 #1 30IN (SUTURE) ×9 IMPLANT
SUT FIBERWIRE #2 38 T-5 BLUE (SUTURE)
SUT MNCRL AB 4-0 PS2 18 (SUTURE) ×3 IMPLANT
SUT VIC AB 2-0 CT1 27 (SUTURE) ×3
SUT VIC AB 2-0 CT1 TAPERPNT 27 (SUTURE) ×1 IMPLANT
SUTURE FIBERWR #2 38 T-5 BLUE (SUTURE) IMPLANT
TAPE LABRALWHITE 1.5X36 (TAPE) ×7 IMPLANT
TAPE SUT LABRALTAP WHT/BLK (SUTURE) ×3 IMPLANT
TOWEL OR 17X26 10 PK STRL BLUE (TOWEL DISPOSABLE) ×3 IMPLANT
TOWER SMARTMIX MINI (MISCELLANEOUS) ×1 IMPLANT

## 2018-06-02 NOTE — Anesthesia Preprocedure Evaluation (Signed)
Anesthesia Evaluation  Patient identified by MRN, date of birth, ID band Patient awake    Reviewed: Allergy & Precautions, NPO status , Patient's Chart, lab work & pertinent test results  History of Anesthesia Complications Negative for: history of anesthetic complications  Airway Mallampati: II  TM Distance: >3 FB Neck ROM: Full    Dental  (+) Partial Lower, Partial Upper, Missing   Pulmonary Current Smoker,    breath sounds clear to auscultation       Cardiovascular hypertension, Pt. on medications  Rhythm:Regular     Neuro/Psych Seizures -,  PSYCHIATRIC DISORDERS Anxiety Depression    GI/Hepatic Neg liver ROS, GERD  Medicated and Controlled,  Endo/Other  negative endocrine ROS  Renal/GU negative Renal ROS     Musculoskeletal  (+) Arthritis ,   Abdominal   Peds  Hematology negative hematology ROS (+)   Anesthesia Other Findings   Reproductive/Obstetrics                             Anesthesia Physical Anesthesia Plan  ASA: II  Anesthesia Plan: General and Regional   Post-op Pain Management:  Regional for Post-op pain   Induction: Intravenous  PONV Risk Score and Plan: 1 and Dexamethasone and Ondansetron  Airway Management Planned: Oral ETT  Additional Equipment: None  Intra-op Plan:   Post-operative Plan: Extubation in OR  Informed Consent: I have reviewed the patients History and Physical, chart, labs and discussed the procedure including the risks, benefits and alternatives for the proposed anesthesia with the patient or authorized representative who has indicated his/her understanding and acceptance.   Dental advisory given  Plan Discussed with: CRNA and Surgeon  Anesthesia Plan Comments:         Anesthesia Quick Evaluation

## 2018-06-02 NOTE — Anesthesia Procedure Notes (Signed)
Anesthesia Regional Block: Interscalene brachial plexus block   Pre-Anesthetic Checklist: ,, timeout performed, Correct Patient, Correct Site, Correct Laterality, Correct Procedure, Correct Position, site marked, Risks and benefits discussed,  Surgical consent,  Pre-op evaluation,  At surgeon's request and post-op pain management  Laterality: Right  Prep: chloraprep       Needles:  Injection technique: Single-shot     Needle Length: 9cm  Needle Gauge: 22     Additional Needles: Arrow StimuQuik ECHO Echogenic Stimulating PNB Needle  Procedures:,,,, ultrasound used (permanent image in chart),,,,  Narrative:  Start time: 06/02/2018 7:21 AM End time: 06/02/2018 7:26 AM Injection made incrementally with aspirations every 5 mL.  Performed by: Personally  Anesthesiologist: Oleta Mouse, MD

## 2018-06-02 NOTE — H&P (Signed)
Zachary Holmes is an 57 y.o. male.   Chief Complaint: R shoulder pain and dysfunction. HPI: Endstage R shoulder arthritis with significant pain and dysfunction, failed conservative measures.  Pain interferes with sleep and quality of life.  Past Medical History:  Diagnosis Date  . Anxiety   . Arthritis    "legs" (09/20/2014)  . Depression   . GERD (gastroesophageal reflux disease)   . Hypertension   . OA (osteoarthritis) of shoulder    right  . Seizures ([place]) 1983   x1, S/P head injury  . Serratia marcescens infection 2011   left knee  . Umbilical hernia   . Wears dentures    partial upper    Past Surgical History:  Procedure Laterality Date  . ANKLE FUSION Right 11/24/2007   Archie Endo 12/04/2010  . COLONOSCOPY WITH PROPOFOL N/A 05/13/2017   Procedure: COLONOSCOPY WITH PROPOFOL;  Surgeon: Lucilla Lame, MD;  Location: [facility];  Service: Endoscopy;  Laterality: N/A;  requests early  . FRACTURE SURGERY    . JOINT REPLACEMENT    . KNEE ARTHROCENTESIS Left X 5   "got infected . . ."  . MULTIPLE TOOTH EXTRACTIONS    . SHOULDER ARTHROSCOPY Left 06/2007   Archie Endo 12/03/2010  . TOTAL KNEE ARTHROPLASTY Left 2008   Archie Endo 04/09/2010  . TOTAL SHOULDER ARTHROPLASTY Left 09/20/2014  . TOTAL SHOULDER ARTHROPLASTY Left 09/20/2014   Procedure: TOTAL SHOULDER ARTHROPLASTY;  Surgeon: Nita Sells, MD;  Location: [facility];  Service: Orthopedics;  Laterality: Left;  Left total shoulder arthroplasty    Family History  Problem Relation Age of Onset  . COPD Mother   . Heart disease Father    Social History:  reports that he has been smoking cigarettes. He has a 10.00 pack-year smoking history. He has quit using smokeless tobacco.  His smokeless tobacco use included chew. He reports that he drank about 12.0 standard drinks of alcohol per week. He reports that he has current or past drug history. Drug: Marijuana.  Allergies: No Known Allergies  Medications Prior to Admission   Medication Sig Dispense Refill  . ALPRAZolam (XANAX) 0.5 MG tablet Take 1 tablet (0.5 mg total) by mouth 3 (three) times daily as needed for anxiety. 90 tablet 3  . cyclobenzaprine (FLEXERIL) 10 MG tablet Take 1 tablet (10 mg total) by mouth 3 (three) times daily as needed for muscle spasms. (Patient taking differently: Take 10 mg by mouth 2 (two) times daily. ) 90 tablet 3  . omeprazole (PRILOSEC) 40 MG capsule Take 1 capsule (40 mg total) by mouth daily. (Patient taking differently: Take 40 mg by mouth daily as needed (GERD). ) 30 capsule 5  . lisinopril (PRINIVIL,ZESTRIL) 20 MG tablet Take 1 tablet (20 mg total) by mouth daily. (Patient not taking: Reported on 05/20/2018) 30 tablet 3  . traMADol (ULTRAM) 50 MG tablet Take 1 tablet (50 mg total) by mouth every 8 (eight) hours as needed. (Patient not taking: Reported on 05/20/2018) 20 tablet 0    No results found for this or any previous visit (from the past 48 hour(s)). No results found.  Review of Systems  All other systems reviewed and are negative.   Blood pressure (!) 149/87, pulse 78, temperature 97.6 F (36.4 C), temperature source Oral, resp. rate 18, height 6' (1.829 m), weight 117.5 kg, SpO2 96 %. Physical Exam  Constitutional: He is oriented to person, place, and time. He appears well-developed and well-nourished.  HENT:  Head: Atraumatic.  Eyes: EOM are  normal.  Cardiovascular: Intact distal pulses.  Respiratory: Effort normal.  Musculoskeletal:  R shoulder pain with limited ROM. NVID.  Neurological: He is alert and oriented to person, place, and time.  Skin: Skin is warm and dry.  Psychiatric: He has a normal mood and affect.     Assessment/Plan R shoulder endstage arthritis Plan R TSA Risks / benefits of surgery discussed Consent on chart  NPO for OR Preop antibiotics   Isabella Stalling, MD 06/02/2018, 7:23 AM

## 2018-06-02 NOTE — Progress Notes (Signed)
oob recliner/ off monitors awaiting room assignment

## 2018-06-02 NOTE — Transfer of Care (Signed)
Immediate Anesthesia Transfer of Care Note  Patient: Zachary Holmes  Procedure(s) Performed: RIGHT TOTAL SHOULDER ARTHROPLASTY (Right )  Patient Location: PACU  Anesthesia Type:General and Regional  Level of Consciousness: awake, alert , oriented and patient cooperative  Airway & Oxygen Therapy: Patient Spontanous Breathing and Patient connected to nasal cannula oxygen  Post-op Assessment: Report given to RN and Post -op Vital signs reviewed and stable  Post vital signs: Reviewed and stable  Last Vitals:  Vitals Value Taken Time  BP    Temp    Pulse 59 06/02/2018  9:59 AM  Resp 14 06/02/2018  9:59 AM  SpO2 93 % 06/02/2018  9:59 AM  Vitals shown include unvalidated device data.  Last Pain:  Vitals:   06/02/18 0600  TempSrc:   PainSc: 10-Worst pain ever      Patients Stated Pain Goal: 3 (37/85/88 5027)  Complications: No apparent anesthesia complications

## 2018-06-02 NOTE — Discharge Instructions (Signed)

## 2018-06-02 NOTE — Anesthesia Procedure Notes (Signed)
Procedure Name: Intubation Date/Time: 06/02/2018 7:42 AM Performed by: Shirlyn Goltz, CRNA Pre-anesthesia Checklist: Patient identified, Emergency Drugs available, Suction available and Patient being monitored Patient Re-evaluated:Patient Re-evaluated prior to induction Oxygen Delivery Method: Circle system utilized Preoxygenation: Pre-oxygenation with 100% oxygen Induction Type: IV induction Ventilation: Mask ventilation without difficulty Laryngoscope Size: Mac and 4 Grade View: Grade I Tube type: Oral Tube size: 7.5 mm Number of attempts: 1 Airway Equipment and Method: Stylet Placement Confirmation: ETT inserted through vocal cords under direct vision,  positive ETCO2 and breath sounds checked- equal and bilateral Secured at: 23 cm Tube secured with: Tape Dental Injury: Teeth and Oropharynx as per pre-operative assessment

## 2018-06-02 NOTE — Op Note (Signed)
Procedure(s): RIGHT TOTAL SHOULDER ARTHROPLASTY Procedure Note  DAMOND BORCHERS male 57 y.o. 06/02/2018  Procedure(s) and Anesthesia Type:    * RIGHT TOTAL SHOULDER ARTHROPLASTY - Choice  Surgeon(s) and Role:    Tania Ade, MD - Primary   Indications:  57 y.o. male  With endstage right shoulder arthritis. Pain and dysfunction interfered with quality of life and nonoperative treatment with activity modification, NSAIDS and injections failed.     Surgeon: Isabella Stalling   Assistants: Jeanmarie Hubert PA-C [facility] was present and scrubbed throughout the procedure and was essential in positioning, retraction, exposure, and closure)  Anesthesia: General endotracheal anesthesia with preoperative interscalene block given by the attending anesthesiologist    Procedure Detail  RIGHT TOTAL SHOULDER ARTHROPLASTY  Findings: Tornier flex anatomic press-fit size 4 stem with a 53*19  head, cemented size 60L Cortiloc glenoid.  A lesser tuberosity osteotomy was performed and repaired at the conclusion of the procedure.  Estimated Blood Loss:  200 mL         Drains: None   Blood Given: none          Specimens: none        Complications:  * No complications entered in OR log *         Disposition: PACU - hemodynamically stable.         Condition: stable    Procedure:   The patient was identified in the preoperative holding area where I personally marked the operative extremity after verifying with the patient and consent. He  was taken to the operating room where He was transferred to the   operative table.  The patient received an interscalene block in   the holding area by the attending anesthesiologist.  General anesthesia was induced   in the operating room without complication.  The patient did receive IV  Ancef prior to the commencement of the procedure.  The patient was   placed in the beach-chair position with the back raised about 30   degrees.  The  nonoperative extremity and head and neck were carefully   positioned and padded protecting against neurovascular compromise.  The   left upper extremity was then prepped and draped in the standard sterile   fashion.    The appropriate operative time-out was performed with   Anesthesia, the perioperative staff, as well as myself and we all agreed   that the right side was the correct operative site.  The patient received 1 g IV tranexamic acid at the start of the case around time of the incision. An approximately   10 cm incision was made from the tip of the coracoid to the center point of the   humerus at the level of the axilla.  Dissection was carried down sharply   through subcutaneous tissues and cephalic vein was identified and taken   laterally with the deltoid.  The pectoralis major was taken medially.  The   upper 1 cm of the pectoralis major was released from its attachment on   the humerus.  The clavipectoral fascia was incised just lateral to the   conjoined tendon.  This incision was carried up to but not into the   coracoacromial ligament.  Digital palpation was used to prove   integrity of the axillary nerve which was protected throughout the   procedure.  Musculocutaneous nerve was not palpated in the operative   field.  Conjoined tendon was then retracted gently medially and the   deltoid  laterally.  Anterior circumflex humeral vessels were clamped and   coagulated.  The soft tissues overlying the biceps was incised and this   incision was carried across the transverse humeral ligament to the base   of the coracoid.  The biceps was noted to be severely degenerated. It was released from the superior labrum. The biceps was then tenodesed to the soft tissue just above   pectoralis major and the remaining portion of the biceps superiorly was   excised.  An osteotomy was performed at the lesser tuberosity.  The capsule was then   released all the way down to the 6 o'clock position  of the humeral head.   The humeral head was then delivered with simultaneous adduction,   extension and external rotation.  All humeral osteophytes were removed   and the anatomic neck of the humerus was marked and cut free hand at   approximately 25 degrees retroversion within about 3 mm of the cuff   reflection posteriorly.  The head size was estimated to be a 52 medium   offset.  At that point, the humeral head was retracted posteriorly with   a Fukuda retractor.   Remaining portion of the capsule was released at the base of the   coracoid.  The remaining biceps anchor and the entire anterior-inferior   labrum was excised.  The posterior labrum was also excised but the   posterior capsule was not released.  The guidepin was placed bicortically with non elevated guide.  The reamer was used to ream to concentric bone with punctate bleeding.  This gave an excellent concentric surface.  The center hole was then drilled for an anchor peg glenoid followed by the three peripheral holes and none of the holes   exited the glenoid wall.  I then pulse irrigated these holes and dried   them with Surgicel.  The three peripheral holes were then   pressurized cemented and the anchor peg glenoid was placed and impacted   with an excellent fit.  The glenoid was a 60L component.  The proximal humerus was then again exposed taking care not to displace the glenoid.    The entry awl was used followed by sounding reamers and then sequentially broached from size 1-4.  This was then left in place and the calcar planer was used. Trial head was placed with a 53*19.  With the trial implantation of the component, there was approximately 50% posterior translation with immediate snap back to the   anatomic position.  With forward elevation, there was no tendency   towards posterior subluxation.   The trial was removed and the final implant was prepared on a back table.  The trial was removed and the final implant was  prepared on a back table.   3 small holes were drilled on the medial side of the lesser tuberosity osteotomy, through which 2 labral tapes were passed. The implant was then placed through the loop of the 2 labral tapes and impacted with an excellent press-fit. This achieved excellent anatomic reconstruction of the proximal humerus.  The joint was then copiously irrigated with pulse lavage.  The subscapularis and   lesser tuberosity osteotomy were then repaired using the 2 labral tapes previously passed in a double row fashion with horizontal mattress sutures medially brought over through bone tunnels tied over a bone bridge laterally.   One #1 Ethibond was placed at the rotator interval just above   the lesser tuberosity. Copious irrigation was used.  Skin was closed with 2-0 Vicryl sutures in the deep dermal layer and 4-0 Monocryl in a subcuticular  running fashion.  Sterile dressings were then applied including Aquacel.  The patient was placed in a sling and allowed to awaken from general anesthesia and taken to the recovery room in stable condition.      POSTOPERATIVE PLAN:  Early passive range of motion will be allowed with the goal of 0 degrees external rotation and 90 degrees forward elevation.  No internal rotation at this time.  No active motion of the arm until the lesser tuberosity heals.  The patient will likely be kept in the hospital for 1-2 days and then discharged home.

## 2018-06-03 ENCOUNTER — Encounter (HOSPITAL_COMMUNITY): Payer: Self-pay | Admitting: Orthopedic Surgery

## 2018-06-03 LAB — CBC
HEMATOCRIT: 43.2 % (ref 39.0–52.0)
HEMOGLOBIN: 13.9 g/dL (ref 13.0–17.0)
MCH: 29.1 pg (ref 26.0–34.0)
MCHC: 32.2 g/dL (ref 30.0–36.0)
MCV: 90.6 fL (ref 80.0–100.0)
NRBC: 0 % (ref 0.0–0.2)
Platelets: 232 10*3/uL (ref 150–400)
RBC: 4.77 MIL/uL (ref 4.22–5.81)
RDW: 13.4 % (ref 11.5–15.5)
WBC: 20.8 10*3/uL — AB (ref 4.0–10.5)

## 2018-06-03 LAB — BASIC METABOLIC PANEL
ANION GAP: 7 (ref 5–15)
BUN: 12 mg/dL (ref 6–20)
CALCIUM: 8.6 mg/dL — AB (ref 8.9–10.3)
CO2: 23 mmol/L (ref 22–32)
CREATININE: 0.95 mg/dL (ref 0.61–1.24)
Chloride: 107 mmol/L (ref 98–111)
GFR calc non Af Amer: >60 mL/min (ref >60)
Glucose, Bld: 108 mg/dL — ABNORMAL HIGH (ref 70–99)
Potassium: 4.7 mmol/L (ref 3.5–5.1)
SODIUM: 137 mmol/L (ref 135–145)

## 2018-06-03 MED ORDER — HYDROMORPHONE HCL 2 MG PO TABS: 2.0000 mg | ORAL_TABLET | Freq: Four times a day (QID) | ORAL | 0 refills | Status: DC | PRN

## 2018-06-03 NOTE — Progress Notes (Signed)
Occupational Therapy Evaluation Patient Details Name: Zachary Holmes MRN: [id number] DOB: 07/17/1961 Today's Date: 06/03/2018    History of Present Illness 57 yo s/p R TSA. PMH L TSA, B TKA; OA, anxiety and depression.    Clinical Impression   Completed education regarding compensatory strategies for ADL and PROM protocol for R shoulder per orders. Pt asking for theraband to begin strengthening exercises with R arm. Stressed importance of only completing PROM of FF to 90 and ER to neutral in supine and to NOT actively exercise R shoulder until given clearance by Dr. Tamera Punt. Pt verbalized understanding. Acute Tt signing off.     Follow Up Recommendations  Follow surgeon's recommendation for DC plan and follow-up therapies;Supervision - Intermittent    Equipment Recommendations  None recommended by OT    Recommendations for Other Services       Precautions / Restrictions Precautions Precautions: Shoulder Type of Shoulder Precautions: passive; FF 0-90; ER to neutral Restrictions Weight Bearing Restrictions: Yes RUE Weight Bearing: Non weight bearing      Mobility Bed Mobility Overal bed mobility: Modified Independent                Transfers Overall transfer level: Modified independent                    Balance                                           ADL either performed or assessed with clinical judgement   ADL Overall ADL's : Needs assistance/impaired                                     Functional mobility during ADLs: Modified independent General ADL Comments: Educated pt on compensatory techniques for ADL; pt able to return demonstrate; Educated on  options for management of sling. Encouraged pt to used dowel rod to push strap over shoulder; pt required multip-le vc to not "sling arm around" while bathing and dressing     Vision         Perception     Praxis      Pertinent Vitals/Pain Pain Assessment:  0-10 Pain Score: 10-Worst pain ever Pain Location: R shoulder Pain Descriptors / Indicators: Aching Pain Intervention(s): Limited activity within patient's tolerance;Repositioned;Ice applied;RN gave pain meds during session     Hand Dominance Right   Extremity/Trunk Assessment Upper Extremity Assessment Upper Extremity Assessment: RUE deficits/detail RUE Deficits / Details: s/p TSA; block still intact; moving fingers only   Lower Extremity Assessment Lower Extremity Assessment: (s/p TKAd; limited ROM B knees)   Cervical / Trunk Assessment Cervical / Trunk Assessment: Normal   Communication Communication Communication: No difficulties   Cognition Arousal/Alertness: Awake/alert Behavior During Therapy: Impulsive Overall Cognitive Status: Within Functional Limits for tasks assessed                                 General Comments: impulsive with decreased safety/judgement at baseline   General Comments       Exercises Exercises: Shoulder Shoulder Exercises Shoulder Flexion: PROM;Right;10 reps;Supine(0-45) Shoulder External Rotation: PROM;Right;10 reps;Supine(to neutral)   Shoulder Instructions Shoulder Instructions Donning/doffing shirt without moving shoulder: Supervision/safety Method for sponge bathing under operated UE: Supervision/safety Donning/doffing  sling/immobilizer: Supervision/safety Correct positioning of sling/immobilizer: Supervision/safety ROM for elbow, wrist and digits of operated UE: Supervision/safety Sling wearing schedule (on at all times/off for ADL's): Supervision/safety Proper positioning of operated UE when showering: Supervision/safety Positioning of UE while sleeping: [place] expects to be discharged to:: Private residence Living Arrangements: Spouse/significant other;Children Available Help at Discharge: Friend(s);Available PRN/intermittently Type of Home: House Home Access: Level  entry     Home Layout: One level     Bathroom Shower/Tub: Occupational psychologist: Handicapped height Bathroom Accessibility: Yes How Accessible: Accessible via wheelchair;Accessible via walker Home Equipment: [place] - 2 wheels;Cane - single point;Grab bars - toilet;Grab bars - tub/shower;Adaptive equipment Adaptive Equipment: Reacher;Sock aid;Long-handled sponge Additional Comments: handicapped accessible      Prior Functioning/Environment Level of Independence: Independent                 OT Problem List: Decreased range of motion;Decreased safety awareness;Decreased knowledge of precautions;Pain;Impaired UE functional use      OT Treatment/Interventions:      OT Goals(Current goals can be found in the care plan section) Acute Rehab OT Goals Patient Stated Goal: to get good use out of R shoulder OT Goal Formulation: All assessment and education complete, DC therapy  OT Frequency:     Barriers to D/C:            Co-evaluation              AM-PAC PT "6 Clicks" Daily Activity     Outcome Measure Help from another person eating meals?: None Help from another person taking care of personal grooming?: None Help from another person toileting, which includes using toliet, bedpan, or urinal?: None Help from another person bathing (including washing, rinsing, drying)?: A Little Help from another person to put on and taking off regular upper body clothing?: A Little Help from another person to put on and taking off regular lower body clothing?: None 6 Click Score: 22   End of Session Nurse Communication: Mobility status;Other (comment)(DC plan)  Activity Tolerance: Patient tolerated treatment well Patient left: in chair;with call bell/phone within reach  OT Visit Diagnosis: Muscle weakness (generalized) (M62.81);Pain Pain - Right/Left: Right Pain - part of body: Shoulder                Time: 0539-7673 OT Time Calculation (min): 40 min Charges:  OT  General Charges $OT Visit: 1 Visit OT Evaluation $OT Eval Low Complexity: 1 Low $OT Eval Moderate Complexity: 1 Mod OT Treatments $Self Care/Home Management : 8-22 mins $Therapeutic Exercise: 8-22 mins  Maurie Boettcher, OT/L   Acute OT Clinical Specialist [place] Pager [phone] Office [phone]   [facility] 06/03/2018, 10:36 AM

## 2018-06-03 NOTE — Plan of Care (Signed)
  Problem: Pain Managment: Goal: General experience of comfort will improve Outcome: Progressing   Problem: Safety: Goal: Ability to remain free from injury will improve Outcome: Progressing   

## 2018-06-03 NOTE — Discharge Summary (Signed)
Patient ID: Zachary Holmes MRN: [id number] DOB/AGE: 01/14/1961 57 y.o.  Admit date: 06/02/2018 Discharge date: 06/03/2018  Admission Diagnoses:  Active Problems:   S/P shoulder replacement, right   Discharge Diagnoses:  Same  Past Medical History:  Diagnosis Date  . Anxiety   . Arthritis    "legs" (09/20/2014)  . Depression   . GERD (gastroesophageal reflux disease)   . Hypertension   . OA (osteoarthritis) of shoulder    right  . Seizures ([place]) 1983   x1, S/P head injury  . Serratia marcescens infection 2011   left knee  . Umbilical hernia   . Wears dentures    partial upper    Surgeries: Procedure(s): RIGHT TOTAL SHOULDER ARTHROPLASTY on 06/02/2018   Consultants:   Discharged Condition: Improved  Hospital Course: Zachary Holmes is an 57 y.o. male who was admitted 06/02/2018 for operative treatment of right shoulder end stage OA. Patient has severe unremitting pain that affects sleep, daily activities, and work/hobbies. After pre-op clearance the patient was taken to the operating room on 06/02/2018 and underwent  Procedure(s): RIGHT TOTAL SHOULDER ARTHROPLASTY.    Patient was given perioperative antibiotics:  Anti-infectives (From admission, onward)   Start     Dose/Rate Route Frequency Ordered Stop   06/02/18 1500  ceFAZolin (ANCEF) IVPB 2g/100 mL premix     2 g 200 mL/hr over 30 Minutes Intravenous Every 6 hours 06/02/18 1411 06/03/18 0729   06/02/18 0630  ceFAZolin (ANCEF) 3 g in dextrose 5 % 50 mL IVPB     3 g 100 mL/hr over 30 Minutes Intravenous To ShortStay Surgical 06/01/18 1259 06/02/18 1600       Patient was given sequential compression devices, early ambulation, and chemoprophylaxis to prevent DVT.  Patient benefited maximally from hospital stay and there were no complications.    Recent vital signs:  Patient Vitals for the past 24 hrs:  BP Temp Temp src Pulse Resp SpO2  06/03/18 0433 (!) 143/104 (!) 97.5 F (36.4 C) Oral 66 16 92 %   06/03/18 0044 (!) 131/93 97.7 F (36.5 C) Oral 82 18 93 %  06/02/18 1420 (!) 131/91 97.7 F (36.5 C) Oral 70 18 96 %  06/02/18 1157 108/77 - - - - -  06/02/18 1143 112/82 - - - - -  06/02/18 1127 111/73 - - - - -  06/02/18 1022 114/77 97.9 F (36.6 C) - 80 16 94 %  06/02/18 1005 (!) 143/54 - - (!) 59 15 94 %  06/02/18 1000 - - - (!) 59 15 95 %  06/02/18 0959 106/82 98 F (36.7 C) - (!) 59 14 93 %     Recent laboratory studies:  Recent Labs    06/03/18 0403  WBC 20.8*  HGB 13.9  HCT 43.2  PLT 232  NA 137  K 4.7  CL 107  CO2 23  BUN 12  CREATININE 0.95  GLUCOSE 108*  CALCIUM 8.6*     Discharge Medications:   Allergies as of 06/03/2018   No Known Allergies     Medication List    TAKE these medications   ALPRAZolam 0.5 MG tablet Commonly known as:  XANAX Take 1 tablet (0.5 mg total) by mouth 3 (three) times daily as needed for anxiety.   cyclobenzaprine 10 MG tablet Commonly known as:  FLEXERIL Take 1 tablet (10 mg total) by mouth 3 (three) times daily as needed for muscle spasms. What changed:  when to take this   HYDROmorphone  2 MG tablet Commonly known as:  DILAUDID Take 1 tablet (2 mg total) by mouth every 6 (six) hours as needed for severe pain.   lisinopril 20 MG tablet Commonly known as:  PRINIVIL,ZESTRIL Take 1 tablet (20 mg total) by mouth daily.   omeprazole 40 MG capsule Commonly known as:  PRILOSEC Take 1 capsule (40 mg total) by mouth daily. What changed:    when to take this  reasons to take this   traMADol 50 MG tablet Commonly known as:  ULTRAM Take 1 tablet (50 mg total) by mouth every 8 (eight) hours as needed.       Diagnostic Studies: Dg Chest 2 View  Result Date: 05/25/2018 CLINICAL DATA:  Preop for right total shoulder arthroplasty. EXAM: CHEST - 2 VIEW COMPARISON:  Radiographs of April 02, 2010. FINDINGS: The heart size and mediastinal contours are within normal limits. Both lungs are clear. The visualized skeletal  structures are unremarkable. IMPRESSION: No active cardiopulmonary disease. Electronically Signed   By: Marijo Conception, M.D.   On: 05/25/2018 15:44   Dg Shoulder Right Port  Result Date: 06/02/2018 CLINICAL DATA:  Post right shoulder replacement EXAM: PORTABLE RIGHT SHOULDER COMPARISON:  None. FINDINGS: Changes of right shoulder replacement. Normal AP alignment. No visible hardware complicating feature. IMPRESSION: Right shoulder replacement without visible complicating feature. Electronically Signed   By: Rolm Baptise M.D.   On: 06/02/2018 10:30    Disposition: Discharge disposition: 01-Home or Self Care       Discharge Instructions    Call MD / Call 911   Complete by:  As directed    If you experience chest pain or shortness of breath, CALL 911 and be transported to the hospital emergency room.  If you develope a fever above 101 F, pus (white drainage) or increased drainage or redness at the wound, or calf pain, call your surgeon's office.   Constipation Prevention   Complete by:  As directed    Drink plenty of fluids.  Prune juice may be helpful.  You may use a stool softener, such as Colace (over the counter) 100 mg twice a day.  Use MiraLax (over the counter) for constipation as needed.   Diet - low sodium heart healthy   Complete by:  As directed    Increase activity slowly as tolerated   Complete by:  As directed       Follow-up Information    Tania Ade, MD. Schedule an appointment as soon as possible for a visit in 2 weeks.   Specialty:  Orthopedic Surgery Contact information: [address] [place] [address] [phone]            Signed: Grier Mitts 06/03/2018, 8:15 AM

## 2018-06-03 NOTE — Progress Notes (Signed)
RN gave patient discharge instructions and 1 prescriptions for pain medication. Pt was not in any distress was comfortable and dressed pt discharged to discharge lounge awaiting daughter for pick up.

## 2018-06-03 NOTE — Progress Notes (Signed)
   PATIENT ID: Zachary Holmes   1 Day Post-Op Procedure(s) (LRB): RIGHT TOTAL SHOULDER ARTHROPLASTY (Right)  Subjective: doing well, min pain. "tingling" in right arm. No other complaints.   Objective:  Vitals:   06/03/18 0044 06/03/18 0433  BP: (!) 131/93 (!) 143/104  Pulse: 82 66  Resp: 18 16  Temp: 97.7 F (36.5 C) (!) 97.5 F (36.4 C)  SpO2: 93% 92%      R UE dressing c/d/i Wiggles fingers, block still working  Labs:  Recent Labs    06/03/18 0403  HGB 13.9   Recent Labs    06/03/18 0403  WBC 20.8*  RBC 4.77  HCT 43.2  PLT 232   Recent Labs    06/03/18 0403  NA 137  K 4.7  CL 107  CO2 23  BUN 12  CREATININE 0.95  GLUCOSE 108*  CALCIUM 8.6*    Assessment and Plan: 1 day s/p R TSA OT- PROM goal to 90FF 0 ER D/c home when cleared by OT Scripts in chart Post op hypertension, will restart meds and fu with pcp if not improved Fu with Dr. Tamera Punt in 2 weeks  VTE proph: asa, scds

## 2018-06-14 NOTE — Anesthesia Postprocedure Evaluation (Signed)
Anesthesia Post Note  Patient: Zachary Holmes  Procedure(s) Performed: RIGHT TOTAL SHOULDER ARTHROPLASTY (Right )     Patient location during evaluation: PACU Anesthesia Type: Regional and General Level of consciousness: awake and alert Pain management: pain level controlled Vital Signs Assessment: post-procedure vital signs reviewed and stable Respiratory status: spontaneous breathing, nonlabored ventilation, respiratory function stable and patient connected to nasal cannula oxygen Cardiovascular status: blood pressure returned to baseline and stable Postop Assessment: no apparent nausea or vomiting Anesthetic complications: no    Last Vitals:  Vitals:   06/03/18 0044 06/03/18 0433  BP: (!) 131/93 (!) 143/104  Pulse: 82 66  Resp: 18 16  Temp: 36.5 C (!) 36.4 C  SpO2: 93% 92%    Last Pain:  Vitals:   06/03/18 0900  TempSrc:   PainSc: 10-Worst pain ever                 Aliayah Tyer

## 2018-06-15 DIAGNOSIS — Z96611 Presence of right artificial shoulder joint: Secondary | ICD-10-CM | POA: Diagnosis not present

## 2018-06-15 DIAGNOSIS — M19011 Primary osteoarthritis, right shoulder: Secondary | ICD-10-CM | POA: Diagnosis not present

## 2018-06-15 DIAGNOSIS — Z471 Aftercare following joint replacement surgery: Secondary | ICD-10-CM | POA: Diagnosis not present

## 2018-07-13 DIAGNOSIS — Z9889 Other specified postprocedural states: Secondary | ICD-10-CM | POA: Diagnosis not present

## 2018-07-13 DIAGNOSIS — Z96611 Presence of right artificial shoulder joint: Secondary | ICD-10-CM | POA: Diagnosis not present

## 2018-07-13 DIAGNOSIS — Z471 Aftercare following joint replacement surgery: Secondary | ICD-10-CM | POA: Diagnosis not present

## 2018-09-27 ENCOUNTER — Encounter: Payer: Self-pay | Admitting: Adult Health

## 2018-09-27 ENCOUNTER — Ambulatory Visit (INDEPENDENT_AMBULATORY_CARE_PROVIDER_SITE_OTHER): Payer: Medicare Other | Admitting: Adult Health

## 2018-09-27 ENCOUNTER — Other Ambulatory Visit: Payer: Self-pay

## 2018-09-27 VITALS — BP 138/80 | HR 95 | Resp 16 | Ht 72.0 in | Wt 261.0 lb

## 2018-09-27 DIAGNOSIS — I10 Essential (primary) hypertension: Secondary | ICD-10-CM | POA: Diagnosis not present

## 2018-09-27 DIAGNOSIS — M1990 Unspecified osteoarthritis, unspecified site: Secondary | ICD-10-CM

## 2018-09-27 DIAGNOSIS — K219 Gastro-esophageal reflux disease without esophagitis: Secondary | ICD-10-CM

## 2018-09-27 MED ORDER — TRAMADOL HCL 50 MG PO TABS: 50.0000 mg | ORAL_TABLET | Freq: Three times a day (TID) | ORAL | 1 refills | Status: DC | PRN

## 2018-09-27 NOTE — Progress Notes (Addendum)
[facility] [place], [place] [address]  Internal MEDICINE  Office Visit Note  Patient Name: Zachary Holmes  [id number]  [id number]  Date of Service: 09/27/2018  Chief Complaint  Patient presents with  . Gastroesophageal Reflux  . Medical Management of Chronic Issues    HPI  Pt is here for follow up on GERD, HTN and some chronic medical issues.  Overall he reports he is doing well.  He denies any issues with his blood pressure.  His GERD is well controlled using Prilosec.  He denies gasping, shortness of breath, palpitations or any other issues.   Current Medication: Outpatient Encounter Medications as of 09/27/2018  Medication Sig  . ALPRAZolam (XANAX) 0.5 MG tablet Take 1 tablet (0.5 mg total) by mouth 3 (three) times daily as needed for anxiety.  . cyclobenzaprine (FLEXERIL) 10 MG tablet Take 1 tablet (10 mg total) by mouth 3 (three) times daily as needed for muscle spasms. (Patient taking differently: Take 10 mg by mouth 2 (two) times daily. )  . lisinopril (PRINIVIL,ZESTRIL) 20 MG tablet Take 1 tablet (20 mg total) by mouth daily.  Marland Kitchen omeprazole (PRILOSEC) 40 MG capsule Take 1 capsule (40 mg total) by mouth daily. (Patient taking differently: Take 40 mg by mouth daily as needed (GERD). )  . traMADol (ULTRAM) 50 MG tablet Take 1 tablet (50 mg total) by mouth every 8 (eight) hours as needed.  . [DISCONTINUED] traMADol (ULTRAM) 50 MG tablet Take 1 tablet (50 mg total) by mouth every 8 (eight) hours as needed.  . [DISCONTINUED] HYDROmorphone (DILAUDID) 2 MG tablet Take 1 tablet (2 mg total) by mouth every 6 (six) hours as needed for severe pain. (Patient not taking: Reported on 09/27/2018)   No facility-administered encounter medications on file as of 09/27/2018.     Surgical History: Past Surgical History:  Procedure Laterality Date  . ANKLE FUSION Right 11/24/2007   Archie Endo 12/04/2010  . COLONOSCOPY WITH PROPOFOL N/A 05/13/2017   Procedure: COLONOSCOPY  WITH PROPOFOL;  Surgeon: Lucilla Lame, MD;  Location: [place];  Service: Endoscopy;  Laterality: N/A;  requests early  . FRACTURE SURGERY    . JOINT REPLACEMENT    . KNEE ARTHROCENTESIS Left X 5   "got infected . . ."  . MULTIPLE TOOTH EXTRACTIONS    . SHOULDER ARTHROSCOPY Left 06/2007   Archie Endo 12/03/2010  . TOTAL KNEE ARTHROPLASTY Left 2008   Archie Endo 04/09/2010  . TOTAL SHOULDER ARTHROPLASTY Left 09/20/2014  . TOTAL SHOULDER ARTHROPLASTY Left 09/20/2014   Procedure: TOTAL SHOULDER ARTHROPLASTY;  Surgeon: Nita Sells, MD;  Location: [place];  Service: Orthopedics;  Laterality: Left;  Left total shoulder arthroplasty  . TOTAL SHOULDER ARTHROPLASTY Right 06/02/2018   Procedure: RIGHT TOTAL SHOULDER ARTHROPLASTY;  Surgeon: Tania Ade, MD;  Location: [facility];  Service: Orthopedics;  Laterality: Right;    Medical History: Past Medical History:  Diagnosis Date  . Anxiety   . Arthritis    "legs" (09/20/2014)  . Depression   . GERD (gastroesophageal reflux disease)   . Hypertension   . OA (osteoarthritis) of shoulder    right  . Seizures ([place]) 1983   x1, S/P head injury  . Serratia marcescens infection 2011   left knee  . Umbilical hernia   . Wears dentures    partial upper    Family History: Family History  Problem Relation Age of Onset  . COPD Mother   . Heart disease Father     Social  History   Socioeconomic History  . Marital status: Single    Spouse name: Not on file  . Number of children: Not on file  . Years of education: Not on file  . Highest education level: Not on file  Occupational History  . Not on file  Social Needs  . Financial resource strain: Not on file  . Food insecurity:    Worry: Not on file    Inability: Not on file  . Transportation needs:    Medical: Not on file    Non-medical: Not on file  Tobacco Use  . Smoking status: Current Every Day Smoker    Packs/day: 0.50    Years: 20.00    Pack years: 10.00    Types:  Cigarettes  . Smokeless tobacco: Former Systems developer    Types: Chew  . Tobacco comment: pcp PCP had given pt patch  Substance and Sexual Activity  . Alcohol use: Not Currently    Alcohol/week: 12.0 standard drinks    Types: 12 Cans of beer per week    Comment: SOBER SINCE 05/17/2017  . Drug use: Yes    Types: Marijuana    Comment: DAILY  . Sexual activity: Not Currently  Lifestyle  . Physical activity:    Days per week: Not on file    Minutes per session: Not on file  . Stress: Not on file  Relationships  . Social connections:    Talks on phone: Not on file    Gets together: Not on file    Attends religious service: Not on file    Active member of club or organization: Not on file    Attends meetings of clubs or organizations: Not on file    Relationship status: Not on file  . Intimate partner violence:    Fear of current or ex partner: Not on file    Emotionally abused: Not on file    Physically abused: Not on file    Forced sexual activity: Not on file  Other Topics Concern  . Not on file  Social History Narrative  . Not on file      Review of Systems  Constitutional: Negative.  Negative for chills, fatigue and unexpected weight change.  HENT: Negative.  Negative for congestion, rhinorrhea, sneezing and sore throat.   Eyes: Negative for redness.  Respiratory: Negative.  Negative for cough, chest tightness and shortness of breath.   Cardiovascular: Negative.  Negative for chest pain and palpitations.  Gastrointestinal: Negative.  Negative for abdominal pain, constipation, diarrhea, nausea and vomiting.  Endocrine: Negative.   Genitourinary: Negative.  Negative for dysuria and frequency.  Musculoskeletal: Negative.  Negative for arthralgias, back pain, joint swelling and neck pain.  Skin: Negative.  Negative for rash.  Allergic/Immunologic: Negative.   Neurological: Negative.  Negative for tremors and numbness.  Hematological: Negative for adenopathy. Does not bruise/bleed  easily.  Psychiatric/Behavioral: Negative.  Negative for behavioral problems, sleep disturbance and suicidal ideas. The patient is not nervous/anxious.     Vital Signs: BP 138/80   Pulse 95   Resp 16   Ht 6' (1.829 m)   Wt 261 lb (118.4 kg)   SpO2 95%   BMI 35.40 kg/m    Physical Exam Vitals signs and nursing note reviewed.  Constitutional:      General: He is not in acute distress.    Appearance: He is well-developed. He is not diaphoretic.  HENT:     Head: Normocephalic and atraumatic.     Mouth/Throat:  Pharynx: No oropharyngeal exudate.  Eyes:     Pupils: Pupils are equal, round, and reactive to light.  Neck:     Musculoskeletal: Normal range of motion and neck supple.     Thyroid: No thyromegaly.     Vascular: No JVD.     Trachea: No tracheal deviation.  Cardiovascular:     Rate and Rhythm: Normal rate and regular rhythm.     Heart sounds: Normal heart sounds. No murmur. No friction rub. No gallop.   Pulmonary:     Effort: Pulmonary effort is normal. No respiratory distress.     Breath sounds: Normal breath sounds. No wheezing or rales.  Chest:     Chest wall: No tenderness.  Abdominal:     Palpations: Abdomen is soft.     Tenderness: There is no abdominal tenderness. There is no guarding.  Musculoskeletal: Normal range of motion.  Lymphadenopathy:     Cervical: No cervical adenopathy.  Skin:    General: Skin is warm and dry.  Neurological:     Mental Status: He is alert and oriented to person, place, and time.     Cranial Nerves: No cranial nerve deficit.  Psychiatric:        Behavior: Behavior normal.        Thought Content: Thought content normal.        Judgment: Judgment normal.    Assessment/Plan: 1. Gastroesophageal reflux disease without esophagitis Stable, patient will continue using Prilosec for symptoms.  2. Essential hypertension Stable, continue current medication regimen and symptom management.  3. Chronic osteoarthritis Refill  patient's tramadol prescription at this time.  He reports he take medication as needed for pain.  Last prescription was filled 2 months ago. - traMADol (ULTRAM) 50 MG tablet; Take 1 tablet (50 mg total) by mouth every 8 (eight) hours as needed.  Dispense: 30 tablet; Refill: 1  General Counseling: kinta martis understanding of the findings of todays visit and agrees with plan of treatment. I have discussed any further diagnostic evaluation that may be needed or ordered today. We also reviewed his medications today. he has been encouraged to call the office with any questions or concerns that should arise related to todays visit.    No orders of the defined types were placed in this encounter.   Meds ordered this encounter  Medications  . traMADol (ULTRAM) 50 MG tablet    Sig: Take 1 tablet (50 mg total) by mouth every 8 (eight) hours as needed.    Dispense:  30 tablet    Refill:  1    Time spent: 25 Minutes   This patient was seen by Orson Gear AGNP-C in Collaboration with Dr Lavera Guise as a part of collaborative care agreement     Kendell Bane AGNP-C Internal medicine

## 2018-09-27 NOTE — Patient Instructions (Signed)

## 2018-10-18 ENCOUNTER — Telehealth: Payer: Self-pay

## 2018-10-19 NOTE — Telephone Encounter (Signed)
Left message and advised paperwork has been completed and ready for pick up, put a copy in scan. Beth

## 2018-10-19 NOTE — Telephone Encounter (Signed)
Hey. I finished this and put in on your desk.

## 2018-10-20 ENCOUNTER — Other Ambulatory Visit: Payer: Self-pay | Admitting: Adult Health

## 2018-10-20 DIAGNOSIS — R252 Cramp and spasm: Secondary | ICD-10-CM

## 2018-10-20 MED ORDER — CYCLOBENZAPRINE HCL 10 MG PO TABS: 10.0000 mg | ORAL_TABLET | Freq: Three times a day (TID) | ORAL | 3 refills | Status: DC | PRN

## 2018-10-24 ENCOUNTER — Telehealth: Payer: Self-pay | Admitting: Adult Health

## 2018-10-24 NOTE — Telephone Encounter (Signed)
I don't see anything about that in the note.  Can you ask him what the medicine and dose is...aslo what is his pharmacy. Thank you.

## 2018-11-21 ENCOUNTER — Other Ambulatory Visit: Payer: Self-pay

## 2018-11-21 DIAGNOSIS — K219 Gastro-esophageal reflux disease without esophagitis: Secondary | ICD-10-CM

## 2018-11-21 MED ORDER — OMEPRAZOLE 40 MG PO CPDR: 40.0000 mg | DELAYED_RELEASE_CAPSULE | Freq: Every day | ORAL | 3 refills | Status: DC

## 2018-11-23 ENCOUNTER — Encounter: Payer: Self-pay | Admitting: Adult Health

## 2018-12-02 ENCOUNTER — Other Ambulatory Visit: Payer: Self-pay | Admitting: Adult Health

## 2018-12-02 DIAGNOSIS — M1990 Unspecified osteoarthritis, unspecified site: Secondary | ICD-10-CM

## 2018-12-30 ENCOUNTER — Other Ambulatory Visit: Payer: Self-pay

## 2018-12-30 ENCOUNTER — Encounter: Payer: Self-pay | Admitting: Nurse Practitioner

## 2018-12-30 ENCOUNTER — Ambulatory Visit: Payer: Medicare Other | Admitting: Nurse Practitioner

## 2018-12-30 VITALS — Ht 72.0 in | Wt 256.0 lb

## 2018-12-30 DIAGNOSIS — I10 Essential (primary) hypertension: Secondary | ICD-10-CM | POA: Diagnosis not present

## 2018-12-30 DIAGNOSIS — M1990 Unspecified osteoarthritis, unspecified site: Secondary | ICD-10-CM

## 2018-12-30 DIAGNOSIS — K219 Gastro-esophageal reflux disease without esophagitis: Secondary | ICD-10-CM | POA: Diagnosis not present

## 2018-12-30 DIAGNOSIS — F411 Generalized anxiety disorder: Secondary | ICD-10-CM | POA: Diagnosis not present

## 2018-12-30 MED ORDER — TRAMADOL HCL 50 MG PO TABS: 50.0000 mg | ORAL_TABLET | Freq: Four times a day (QID) | ORAL | 3 refills | Status: DC | PRN

## 2018-12-30 MED ORDER — ALPRAZOLAM 0.5 MG PO TABS: 0.5000 mg | ORAL_TABLET | Freq: Three times a day (TID) | ORAL | 3 refills | Status: DC | PRN

## 2018-12-30 NOTE — Progress Notes (Signed)
[facility] [place], [place] [address]  Internal MEDICINE  Telephone Visit  Patient Name: Zachary Holmes  [id number]  [id number]  Date of Service: 12/31/2018  I connected with the patient at 9:17am by telephone and verified the patients identity using two identifiers.   I discussed the limitations, risks, security and privacy concerns of performing an evaluation and management service by telephone and the availability of in person appointments. I also discussed with the patient that there may be a patient responsible charge related to the service.  The patient expressed understanding and agrees to proceed.    Chief Complaint  Patient presents with  . Telephone Assessment  . Telephone Screen  . Anxiety  . Depression  . Hypertension  . Gastroesophageal Reflux    The patient has been contacted via telephone for follow up visit due to concerns for spread of novel coronavirus. The patient suffers from generalized muscle spasms. Will take flexeril as needed, usually two to three times per day. Not working as well as it used to. We have tried tizanidine 4 mg in the past and this just didn't work. He was given a prescription for tramadol 50mg  which he states helps and he generally takes this about twice weekly. He states that he needs to have refills for this today.       Current Medication: Outpatient Encounter Medications as of 12/30/2018  Medication Sig  . ALPRAZolam (XANAX) 0.5 MG tablet Take 1 tablet (0.5 mg total) by mouth 3 (three) times daily as needed for anxiety.  . cyclobenzaprine (FLEXERIL) 10 MG tablet Take 1 tablet (10 mg total) by mouth 3 (three) times daily as needed for muscle spasms.  Marland Kitchen lisinopril (PRINIVIL,ZESTRIL) 20 MG tablet Take 1 tablet (20 mg total) by mouth daily.  Marland Kitchen omeprazole (PRILOSEC) 40 MG capsule Take 1 capsule (40 mg total) by mouth daily.  . traMADol (ULTRAM) 50 MG tablet Take 1 tablet (50 mg total) by mouth every 6 (six) hours  as needed.  . [DISCONTINUED] ALPRAZolam (XANAX) 0.5 MG tablet Take 1 tablet (0.5 mg total) by mouth 3 (three) times daily as needed for anxiety.  . [DISCONTINUED] traMADol (ULTRAM) 50 MG tablet TAKE 1 TABLET(50 MG) BY MOUTH EVERY 8 HOURS AS NEEDED   No facility-administered encounter medications on file as of 12/30/2018.     Surgical History: Past Surgical History:  Procedure Laterality Date  . ANKLE FUSION Right 11/24/2007   Archie Endo 12/04/2010  . COLONOSCOPY WITH PROPOFOL N/A 05/13/2017   Procedure: COLONOSCOPY WITH PROPOFOL;  Surgeon: Lucilla Lame, MD;  Location: [facility];  Service: Endoscopy;  Laterality: N/A;  requests early  . FRACTURE SURGERY    . JOINT REPLACEMENT    . KNEE ARTHROCENTESIS Left X 5   "got infected . . ."  . MULTIPLE TOOTH EXTRACTIONS    . SHOULDER ARTHROSCOPY Left 06/2007   Archie Endo 12/03/2010  . TOTAL KNEE ARTHROPLASTY Left 2008   Archie Endo 04/09/2010  . TOTAL SHOULDER ARTHROPLASTY Left 09/20/2014  . TOTAL SHOULDER ARTHROPLASTY Left 09/20/2014   Procedure: TOTAL SHOULDER ARTHROPLASTY;  Surgeon: Nita Sells, MD;  Location: [facility];  Service: Orthopedics;  Laterality: Left;  Left total shoulder arthroplasty  . TOTAL SHOULDER ARTHROPLASTY Right 06/02/2018   Procedure: RIGHT TOTAL SHOULDER ARTHROPLASTY;  Surgeon: Tania Ade, MD;  Location: [facility];  Service: Orthopedics;  Laterality: Right;    Medical History: Past Medical History:  Diagnosis Date  . Anxiety   . Arthritis    "legs" (  09/20/2014)  . Depression   . GERD (gastroesophageal reflux disease)   . Hypertension   . OA (osteoarthritis) of shoulder    right  . Seizures ([place]) 1983   x1, S/P head injury  . Serratia marcescens infection 2011   left knee  . Umbilical hernia   . Wears dentures    partial upper    Family History: Family History  Problem Relation Age of Onset  . COPD Mother   . Heart disease Father     Social History   Socioeconomic History  . Marital status: Single     Spouse name: Not on file  . Number of children: Not on file  . Years of education: Not on file  . Highest education level: Not on file  Occupational History  . Not on file  Social Needs  . Financial resource strain: Not on file  . Food insecurity:    Worry: Not on file    Inability: Not on file  . Transportation needs:    Medical: Not on file    Non-medical: Not on file  Tobacco Use  . Smoking status: Current Every Day Smoker    Packs/day: 0.50    Years: 20.00    Pack years: 10.00    Types: Cigarettes  . Smokeless tobacco: Former Systems developer    Types: Chew  . Tobacco comment: pcp PCP had given pt patch  Substance and Sexual Activity  . Alcohol use: Not Currently    Alcohol/week: 12.0 standard drinks    Types: 12 Cans of beer per week    Comment: SOBER SINCE 05/17/2017  . Drug use: Yes    Types: Marijuana    Comment: DAILY  . Sexual activity: Not Currently  Lifestyle  . Physical activity:    Days per week: Not on file    Minutes per session: Not on file  . Stress: Not on file  Relationships  . Social connections:    Talks on phone: Not on file    Gets together: Not on file    Attends religious service: Not on file    Active member of club or organization: Not on file    Attends meetings of clubs or organizations: Not on file    Relationship status: Not on file  . Intimate partner violence:    Fear of current or ex partner: Not on file    Emotionally abused: Not on file    Physically abused: Not on file    Forced sexual activity: Not on file  Other Topics Concern  . Not on file  Social History Narrative  . Not on file      Review of Systems  Constitutional: Negative for chills, fatigue and unexpected weight change.  HENT: Negative for congestion, rhinorrhea, sneezing and sore throat.   Respiratory: Negative for cough, chest tightness, shortness of breath and wheezing.   Cardiovascular: Negative for chest pain and palpitations.  Gastrointestinal: Negative for  abdominal pain, constipation, diarrhea, nausea and vomiting.  Endocrine: Negative for cold intolerance, heat intolerance, polydipsia and polyuria.  Musculoskeletal: Positive for arthralgias, joint swelling and myalgias. Negative for back pain and neck pain.  Skin: Negative for rash.  Neurological: Negative for dizziness, tremors, numbness and headaches.  Hematological: Negative for adenopathy. Does not bruise/bleed easily.  Psychiatric/Behavioral: Positive for sleep disturbance. Negative for behavioral problems and suicidal ideas. The patient is nervous/anxious.     Today's Vitals   12/30/18 0850  Weight: 256 lb (116.1 kg)  Height: 6' (1.829 m)  Body mass index is 34.72 kg/m.  Observation/Objective:  The patient is alert and oriented. He is pleasant and answering all questions appropriately. Breathing is non-labored. He is in no acute distress.    Assessment/Plan: 1. Essential hypertension Blood pressure stable. Continue lisinopril as prescribed   2. Gastroesophageal reflux disease without esophagitis May use omeprazole as prescribed   3. GAD (generalized anxiety disorder) Alprazolam 0.5mg  may be taken up to three times daily as needed for acute anxiety. New prescription sent to his pharmacy today.  - ALPRAZolam (XANAX) 0.5 MG tablet; Take 1 tablet (0.5 mg total) by mouth 3 (three) times daily as needed for anxiety.  Dispense: 90 tablet; Refill: 3  4. Chronic osteoarthritis May take tramadol 50mg  as needed and as prescribed for moderate pain. New prescription sent to his pharmacy today.  - traMADol (ULTRAM) 50 MG tablet; Take 1 tablet (50 mg total) by mouth every 6 (six) hours as needed.  Dispense: 30 tablet; Refill: 3  General Counseling: marty uy understanding of the findings of today's phone visit and agrees with plan of treatment. I have discussed any further diagnostic evaluation that may be needed or ordered today. We also reviewed his medications today. he has  been encouraged to call the office with any questions or concerns that should arise related to todays visit.  Reviewed risks and possible side effects associated with taking opiates, benzodiazepines and other CNS depressants. Combination of these could cause dizziness and drowsiness. Advised patient not to drive or operate machinery when taking these medications, as patient's and other's life can be at risk and will have consequences. Patient verbalized understanding in this matter. Dependence and abuse for these drugs will be monitored closely. A Controlled substance policy and procedure is on file which allows [facility] to order a urine drug screen test at any visit. Patient understands and agrees with the plan  This patient was seen by Leretha Pol FNP Collaboration with Dr Lavera Guise as a part of collaborative care agreement  Meds ordered this encounter  Medications  . ALPRAZolam (XANAX) 0.5 MG tablet    Sig: Take 1 tablet (0.5 mg total) by mouth 3 (three) times daily as needed for anxiety.    Dispense:  90 tablet    Refill:  3    Order Specific Question:   Supervising Provider    Answer:   Lavera Guise [[id number]]  . traMADol (ULTRAM) 50 MG tablet    Sig: Take 1 tablet (50 mg total) by mouth every 6 (six) hours as needed.    Dispense:  30 tablet    Refill:  3    Order Specific Question:   Supervising Provider    Answer:   Lavera Guise [[id number]]    Time spent: 40 Minutes    Dr Lavera Guise Internal medicine

## 2019-02-02 ENCOUNTER — Other Ambulatory Visit: Payer: Self-pay

## 2019-02-02 ENCOUNTER — Other Ambulatory Visit: Payer: Self-pay | Admitting: Internal Medicine

## 2019-02-02 DIAGNOSIS — M1990 Unspecified osteoarthritis, unspecified site: Secondary | ICD-10-CM

## 2019-02-02 DIAGNOSIS — R252 Cramp and spasm: Secondary | ICD-10-CM

## 2019-02-02 MED ORDER — CYCLOBENZAPRINE HCL 10 MG PO TABS: 10.0000 mg | ORAL_TABLET | Freq: Three times a day (TID) | ORAL | 1 refills | Status: DC | PRN

## 2019-03-20 IMAGING — CR DG SHOULDER 2+V PORT*R*
1 series · 1 of 1 positions shown · non-contrast
Comparison: None.

CLINICAL DATA: Post right shoulder replacement

EXAM:
PORTABLE RIGHT SHOULDER

[AP]
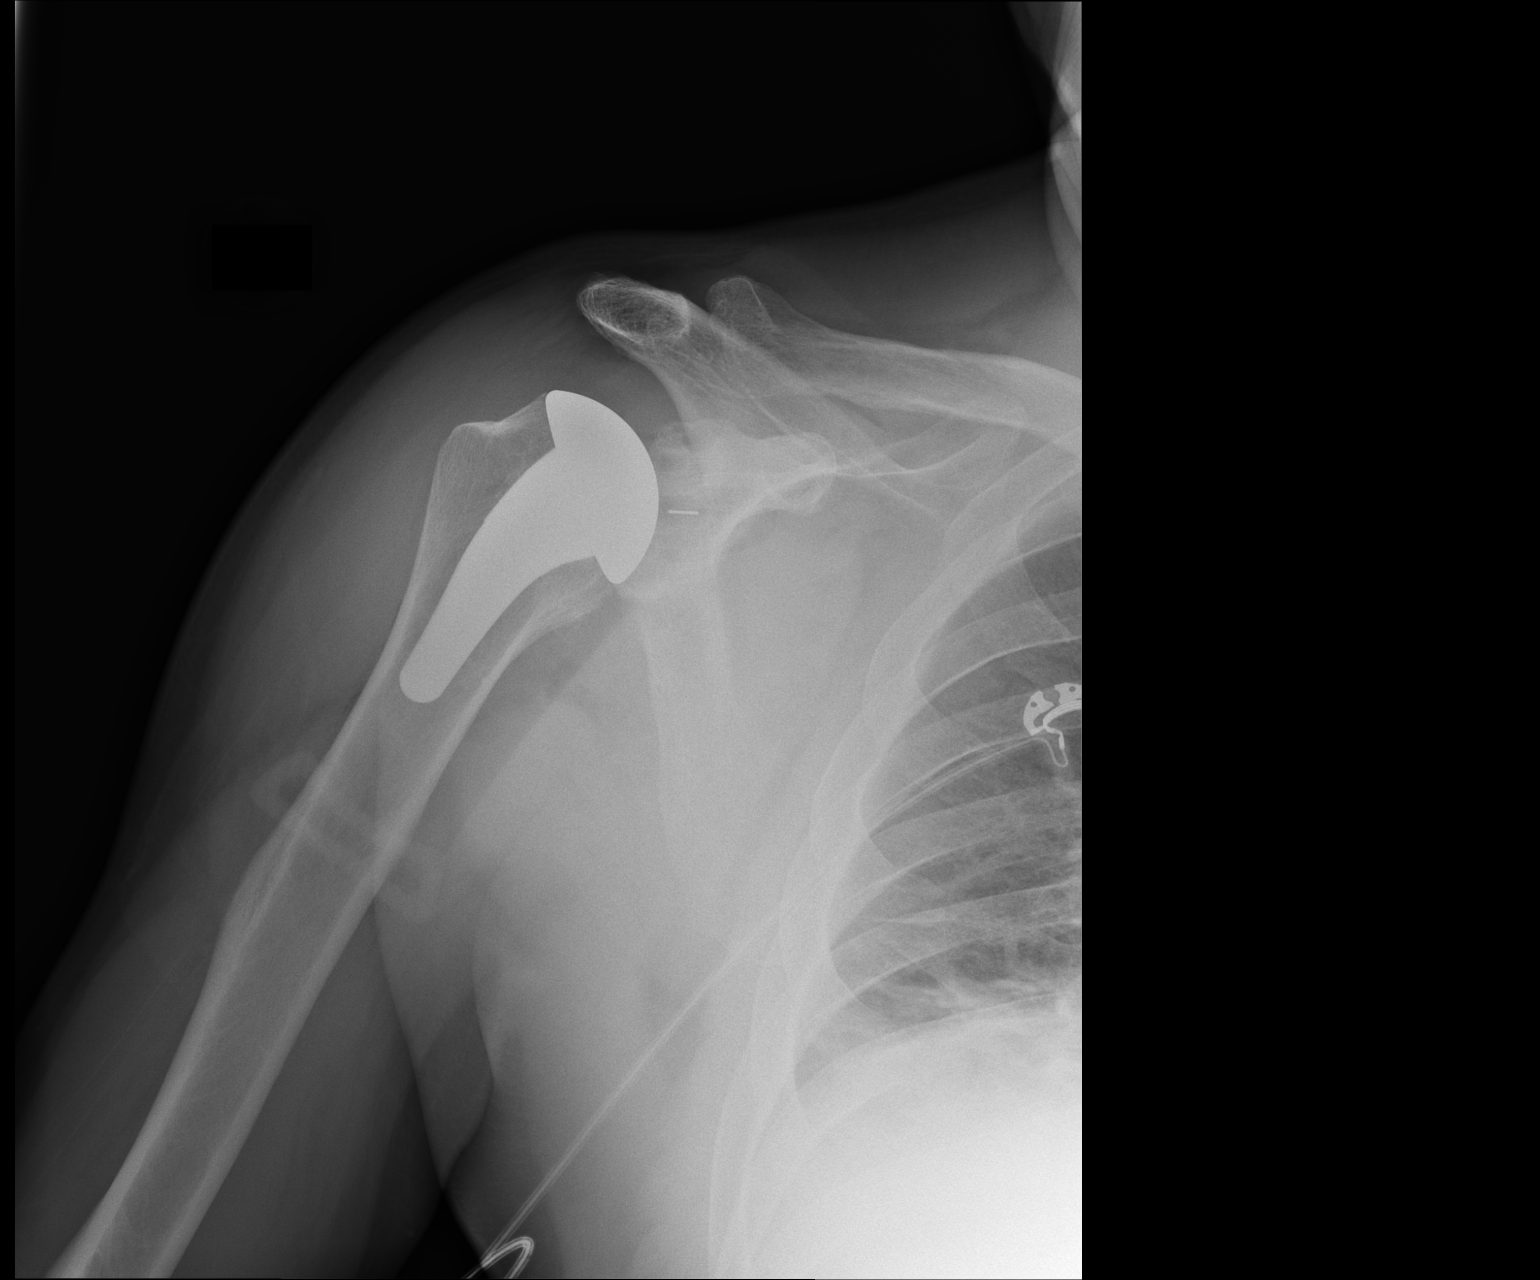

[1 of 1 positions shown; findings below may reference images not displayed]

FINDINGS: Changes of right shoulder replacement. Normal AP alignment. No
visible hardware complicating feature.
IMPRESSION: Right shoulder replacement without visible complicating feature.

## 2019-04-25 ENCOUNTER — Other Ambulatory Visit: Payer: Self-pay

## 2019-04-25 DIAGNOSIS — I10 Essential (primary) hypertension: Secondary | ICD-10-CM

## 2019-04-25 DIAGNOSIS — K219 Gastro-esophageal reflux disease without esophagitis: Secondary | ICD-10-CM

## 2019-04-25 MED ORDER — OMEPRAZOLE 40 MG PO CPDR: 40.0000 mg | DELAYED_RELEASE_CAPSULE | Freq: Every day | ORAL | 3 refills | Status: DC

## 2019-04-25 MED ORDER — LISINOPRIL 20 MG PO TABS: 20.0000 mg | ORAL_TABLET | Freq: Every day | ORAL | 3 refills | Status: DC

## 2019-05-26 ENCOUNTER — Other Ambulatory Visit: Payer: Self-pay

## 2019-05-26 DIAGNOSIS — F411 Generalized anxiety disorder: Secondary | ICD-10-CM

## 2019-05-30 ENCOUNTER — Encounter: Payer: Self-pay | Admitting: Nurse Practitioner

## 2019-05-30 ENCOUNTER — Ambulatory Visit (INDEPENDENT_AMBULATORY_CARE_PROVIDER_SITE_OTHER): Payer: Medicare Other | Admitting: Nurse Practitioner

## 2019-05-30 ENCOUNTER — Other Ambulatory Visit: Payer: Self-pay

## 2019-05-30 VITALS — BP 146/95 | HR 81 | Temp 98.2°F | Resp 16 | Ht 72.0 in | Wt 269.0 lb

## 2019-05-30 DIAGNOSIS — R0683 Snoring: Secondary | ICD-10-CM

## 2019-05-30 DIAGNOSIS — Z0001 Encounter for general adult medical examination with abnormal findings: Secondary | ICD-10-CM

## 2019-05-30 DIAGNOSIS — F411 Generalized anxiety disorder: Secondary | ICD-10-CM | POA: Diagnosis not present

## 2019-05-30 DIAGNOSIS — R300 Dysuria: Secondary | ICD-10-CM | POA: Diagnosis not present

## 2019-05-30 DIAGNOSIS — K219 Gastro-esophageal reflux disease without esophagitis: Secondary | ICD-10-CM

## 2019-05-30 DIAGNOSIS — R1319 Other dysphagia: Secondary | ICD-10-CM | POA: Insufficient documentation

## 2019-05-30 DIAGNOSIS — R252 Cramp and spasm: Secondary | ICD-10-CM | POA: Diagnosis not present

## 2019-05-30 DIAGNOSIS — D485 Neoplasm of uncertain behavior of skin: Secondary | ICD-10-CM

## 2019-05-30 DIAGNOSIS — Z79899 Other long term (current) drug therapy: Secondary | ICD-10-CM

## 2019-05-30 DIAGNOSIS — M1990 Unspecified osteoarthritis, unspecified site: Secondary | ICD-10-CM

## 2019-05-30 LAB — POCT URINE DRUG SCREEN
POC Amphetamine UR: NOT DETECTED
POC BENZODIAZEPINES UR: NOT DETECTED
POC Barbiturate UR: NOT DETECTED
POC Cocaine UR: NOT DETECTED
POC Ecstasy UR: NOT DETECTED
POC Marijuana UR: POSITIVE — AB
POC Methadone UR: NOT DETECTED
POC Methamphetamine UR: NOT DETECTED
POC Opiate Ur: NOT DETECTED
POC Oxycodone UR: NOT DETECTED
POC PHENCYCLIDINE UR: NOT DETECTED
POC TRICYCLICS UR: POSITIVE — AB

## 2019-05-30 MED ORDER — CYCLOBENZAPRINE HCL 10 MG PO TABS: 10.0000 mg | ORAL_TABLET | Freq: Three times a day (TID) | ORAL | 2 refills | Status: DC | PRN

## 2019-05-30 MED ORDER — ALPRAZOLAM 0.5 MG PO TABS: 0.5000 mg | ORAL_TABLET | Freq: Two times a day (BID) | ORAL | 2 refills | Status: DC | PRN

## 2019-05-30 MED ORDER — TRAMADOL HCL 50 MG PO TABS: 50.0000 mg | ORAL_TABLET | Freq: Four times a day (QID) | ORAL | 2 refills | Status: DC | PRN

## 2019-05-30 MED ORDER — OMEPRAZOLE 40 MG PO CPDR: 40.0000 mg | DELAYED_RELEASE_CAPSULE | Freq: Every day | ORAL | 5 refills | Status: DC

## 2019-05-30 NOTE — Progress Notes (Signed)
[facility] [place], [place] [address]  Internal MEDICINE  Office Visit Note  Patient Name: Zachary Holmes  [id number]  [id number]  Date of Service: 05/30/2019   Pt is here for routine health maintenance examination   Chief Complaint  Patient presents with  . Annual Exam    check psa, discuss sleep, and colonoscopy   . Hypertension  . Gastroesophageal Reflux  . Skin Problem    skin spots appearing throughout body   . Choking    choking on spit frequently, sitting, standing, laying down, sometimes takes his breath      The patient is here for health maintenance exam. Several darkened area on the skin of the lower legs and arm. Keep popping up. They are smooth and flush with the skin. Family history of skin cancer.  -has been having trouble managing his secretions. States that this is worse if heis standing up or if he is lying down and trying to sleep. Often gets "choked" on his own saliva.  -has been waking up with choking sensation during the night. Is told that he snores. Does not feel excessive daytime sleepiness.  -chronic pain in both legs. Has had extensive surgery on left lower leg. Does take tramadol 50mg  twice daily if needed for pain.  -anxiety. Currently prescribed alprazolam 0.5mg  three times daily if needed for acute anxiety.  -most recent colonoscopy done per Dr. Allen Norris, GI in 05/2017. Two polyps, tbular adenomas found. Repeat to be done in 5 years.  Due for routine, fasting labs.    Current Medication: Outpatient Encounter Medications as of 05/30/2019  Medication Sig  . ALPRAZolam (XANAX) 0.5 MG tablet Take 1 tablet (0.5 mg total) by mouth 2 (two) times daily as needed for anxiety.  . cyclobenzaprine (FLEXERIL) 10 MG tablet Take 1 tablet (10 mg total) by mouth 3 (three) times daily as needed for muscle spasms.  Marland Kitchen lisinopril (ZESTRIL) 20 MG tablet Take 1 tablet (20 mg total) by mouth daily.  Marland Kitchen omeprazole (PRILOSEC) 40 MG capsule Take  1 capsule (40 mg total) by mouth daily.  . traMADol (ULTRAM) 50 MG tablet Take 1 tablet (50 mg total) by mouth every 6 (six) hours as needed.  . [DISCONTINUED] ALPRAZolam (XANAX) 0.5 MG tablet Take 1 tablet (0.5 mg total) by mouth 3 (three) times daily as needed for anxiety.  . [DISCONTINUED] cyclobenzaprine (FLEXERIL) 10 MG tablet Take 1 tablet (10 mg total) by mouth 3 (three) times daily as needed for muscle spasms.  . [DISCONTINUED] omeprazole (PRILOSEC) 40 MG capsule Take 1 capsule (40 mg total) by mouth daily.  . [DISCONTINUED] traMADol (ULTRAM) 50 MG tablet Take 1 tablet (50 mg total) by mouth every 6 (six) hours as needed.   No facility-administered encounter medications on file as of 05/30/2019.     Surgical History: Past Surgical History:  Procedure Laterality Date  . ANKLE FUSION Right 11/24/2007   Archie Endo 12/04/2010  . COLONOSCOPY WITH PROPOFOL N/A 05/13/2017   Procedure: COLONOSCOPY WITH PROPOFOL;  Surgeon: Lucilla Lame, MD;  Location: [place];  Service: Endoscopy;  Laterality: N/A;  requests early  . FRACTURE SURGERY    . JOINT REPLACEMENT    . KNEE ARTHROCENTESIS Left X 5   "got infected . . ."  . MULTIPLE TOOTH EXTRACTIONS    . SHOULDER ARTHROSCOPY Left 06/2007   Archie Endo 12/03/2010  . TOTAL KNEE ARTHROPLASTY Left 2008   Archie Endo 04/09/2010  . TOTAL SHOULDER ARTHROPLASTY Left 09/20/2014  . TOTAL SHOULDER  ARTHROPLASTY Left 09/20/2014   Procedure: TOTAL SHOULDER ARTHROPLASTY;  Surgeon: Nita Sells, MD;  Location: [place];  Service: Orthopedics;  Laterality: Left;  Left total shoulder arthroplasty  . TOTAL SHOULDER ARTHROPLASTY Right 06/02/2018   Procedure: RIGHT TOTAL SHOULDER ARTHROPLASTY;  Surgeon: Tania Ade, MD;  Location: [place];  Service: Orthopedics;  Laterality: Right;    Medical History: Past Medical History:  Diagnosis Date  . Anxiety   . Arthritis    "legs" (09/20/2014)  . Depression   . GERD (gastroesophageal reflux disease)   .  Hypertension   . OA (osteoarthritis) of shoulder    right  . Seizures ([place]) 1983   x1, S/P head injury  . Serratia marcescens infection 2011   left knee  . Umbilical hernia   . Wears dentures    partial upper    Family History: Family History  Problem Relation Age of Onset  . COPD Mother   . Heart disease Father       Review of Systems  Constitutional: Positive for fatigue. Negative for activity change, chills and unexpected weight change.  HENT: Positive for trouble swallowing. Negative for congestion, postnasal drip, rhinorrhea, sneezing and sore throat.        Having trouble managing his saliva. Feels as though he gets choked on it.   Respiratory: Negative for cough, chest tightness, shortness of breath and wheezing.   Cardiovascular: Negative for chest pain and palpitations.  Gastrointestinal: Negative for abdominal pain, constipation, diarrhea, nausea and vomiting.  Endocrine: Negative for cold intolerance, heat intolerance, polydipsia and polyuria.  Genitourinary: Negative for dysuria and frequency.  Musculoskeletal: Positive for arthralgias and myalgias. Negative for back pain, joint swelling and neck pain.       Pain in bilateral lower legs, worse on the left side. Also has nodular growths on joints of the index and fifth finger of the left hand which are red and tender.   Skin: Negative for rash.       Multiple dark spots showing up on the body. They are flush with the surrounding skin, slightly rough.   Neurological: Negative for dizziness, tremors, numbness and headaches.  Hematological: Negative for adenopathy. Does not bruise/bleed easily.  Psychiatric/Behavioral: Negative for behavioral problems (Depression), sleep disturbance and suicidal ideas. The patient is nervous/anxious.      Today's Vitals   05/30/19 0912  BP: (!) 146/95  Pulse: 81  Resp: 16  Temp: 98.2 F (36.8 C)  SpO2: 96%  Weight: 269 lb (122 kg)  Height: 6' (1.829 m)   Body mass index is  36.48 kg/m.  Physical Exam Vitals signs and nursing note reviewed.  Constitutional:      General: He is not in acute distress.    Appearance: Normal appearance. He is well-developed. He is not diaphoretic.  HENT:     Head: Normocephalic and atraumatic.     Nose: Nose normal.     Mouth/Throat:     Pharynx: No oropharyngeal exudate.  Eyes:     Pupils: Pupils are equal, round, and reactive to light.  Neck:     Musculoskeletal: Normal range of motion and neck supple.     Thyroid: No thyromegaly.     Vascular: No carotid bruit or JVD.     Trachea: No tracheal deviation.  Cardiovascular:     Rate and Rhythm: Normal rate and regular rhythm.     Pulses: Normal pulses.     Heart sounds: Normal heart sounds. No murmur. No friction rub. No gallop.  Pulmonary:     Effort: Pulmonary effort is normal. No respiratory distress.     Breath sounds: Normal breath sounds. No wheezing or rales.  Chest:     Chest wall: No tenderness.  Abdominal:     General: Bowel sounds are normal.     Palpations: Abdomen is soft.     Tenderness: There is no abdominal tenderness.  Musculoskeletal: Normal range of motion.     Comments: Moderate lower back pain and generalized joint pain with point tenderness present. Increased pain in left lower extremity. Difficult to put weight on this leg and walk. Does walk with moderate limp, favoring the left side.  Small, red, nodular growths on the distal joints of the index and fifth fingers of the left hand. They are tender and limt the ability for him to flex and extend the fingers.   Lymphadenopathy:     Cervical: No cervical adenopathy.  Skin:    General: Skin is warm and dry.     Comments: Multiple round, dark lesions found on the arms, legs, chest, and back. They are approximately 37mm in diameter and smaller. They are flush with surrounding skin. They are non tender and non-irritating. They are slightly rough in texture.   Neurological:     Mental Status: He is  alert and oriented to person, place, and time.     Cranial Nerves: No cranial nerve deficit.  Psychiatric:        Behavior: Behavior normal.        Thought Content: Thought content normal.        Judgment: Judgment normal.    Depression screen [facility] 2/9 05/30/2019 12/30/2018 09/27/2018 05/27/2018 01/25/2018  Decreased Interest 0 1 0 0 0  Down, Depressed, Hopeless 0 0 3 0 0  PHQ - 2 Score 0 1 3 0 0  Altered sleeping - - 3 - -  Tired, decreased energy - - 3 - -  Change in appetite - - 1 - -  Feeling bad or failure about yourself  - - 3 - -  Trouble concentrating - - 3 - -  Moving slowly or fidgety/restless - - 3 - -  Suicidal thoughts - - 0 - -  PHQ-9 Score - - 19 - -  Difficult doing work/chores - - Extremely dIfficult - -    Functional Status Survey: Is the patient deaf or have difficulty hearing?: No Does the patient have difficulty seeing, even when wearing glasses/contacts?: Yes Does the patient have difficulty concentrating, remembering, or making decisions?: No Does the patient have difficulty walking or climbing stairs?: Yes Does the patient have difficulty dressing or bathing?: No Does the patient have difficulty doing errands alone such as visiting a doctor's office or shopping?: No  MMSE - [place] Exam 05/27/2018  Orientation to time 5  Orientation to Place 5  Registration 3  Attention/ Calculation 5  Recall 3  Language- name 2 objects 2  Language- repeat 1  Language- follow 3 step command 3  Language- read & follow direction 1  Write a sentence 1  Copy design 1  Total score 30    Fall Risk  05/30/2019 12/30/2018 09/27/2018 05/27/2018 01/25/2018  Falls in the past year? 1 1 1  Yes Yes  Number falls in past yr: 1 1 1 2  or more 2 or more  Injury with Fall? 0 0 0 No No  Comment - - - - -  Risk for fall due to : - - - - -  Follow up - - - - -  Comment - - - - -      LABS: Recent Results (from the past 2160 hour(s))  POCT Urine Drug Screen     Status:  Abnormal   Collection Time: 05/30/19  9:36 AM  Result Value Ref Range   POC METHAMPHETAMINE UR None Detected None Detected   POC Opiate Ur None Detected None Detected   POC Barbiturate UR None Detected None Detected   POC Amphetamine UR None Detected None Detected   POC Oxycodone UR None Detected None Detected   POC Cocaine UR None Detected None Detected   POC Ecstasy UR None Detected None Detected   POC TRICYCLICS UR Positive (A) None Detected   POC PHENCYCLIDINE UR None Detected None Detected   POC MARIJUANA UR Positive (A) None Detected   POC METHADONE UR None Detected None Detected   POC BENZODIAZEPINES UR None Detected None Detected   URINE TEMPERATURE     POC DRUG SCREEN OXIDANTS URINE     POC SPECIFIC GRAVITY URINE     POC PH URINE     Methylenedioxyamphetamine     Assessment/Plan: 1. Encounter for general adult medical examination with abnormal findings Annual health maintenance exam today. Lab slip given to get routine,fasting labs drawn, including PSA.  2. Other dysphagia Refer to Dr. Allen Norris, who did screening colonoscopy for further evaluation and treatment.  - Ambulatory referral to Gastroenterology  3. Loud snoring Home sleep study ordered for further evaluatoin.  - Home sleep test  4. Neoplasm of uncertain behavior of skin of lower extremity Multiple lesioins present. Refer t dermatology for further evaluation and treatment.  - Ambulatory referral to Dermatology  5. Chronic osteoarthritis May take tramadol 50mg  as needed and as prescribed. Rx for #30 tablets sent to his pharmacy.  - traMADol (ULTRAM) 50 MG tablet; Take 1 tablet (50 mg total) by mouth every 6 (six) hours as needed.  Dispense: 30 tablet; Refill: 2  6. GAD (generalized anxiety disorder) UDS positivefor THC. Reduced dosing of alprazolam 0.5mg  to BID from TID. Discussed need to continue weaning this dose down as long as UDS continues to be positive for THC. He voiced understanding.  - ALPRAZolam  (XANAX) 0.5 MG tablet; Take 1 tablet (0.5 mg total) by mouth 2 (two) times daily as needed for anxiety.  Dispense: 60 tablet; Refill: 2  7. Cramp and spasm - cyclobenzaprine (FLEXERIL) 10 MG tablet; Take 1 tablet (10 mg total) by mouth 3 (three) times daily as needed for muscle spasms.  Dispense: 90 tablet; Refill: 2  8. Gastroesophageal reflux disease without esophagitis - omeprazole (PRILOSEC) 40 MG capsule; Take 1 capsule (40 mg total) by mouth daily.  Dispense: 30 capsule; Refill: 5  9. Dysuria - UA/M w/rflx Culture, Routine  10. Encounter for long-term (current) use of medications - POCT Urine Drug Screen positive for TCA and THC  General Counseling: Zachary Holmes verbalizes understanding of the findings of todays visit and agrees with plan of treatment. I have discussed any further diagnostic evaluation that may be needed or ordered today. We also reviewed his medications today. he has been encouraged to call the office with any questions or concerns that should arise related to todays visit.    Counseling:  Reviewed risks and possible side effects associated with taking opiates, benzodiazepines and other CNS depressants. Combination of these could cause dizziness and drowsiness. Advised patient not to drive or operate machinery when taking these medications, as patient's and other's life can  be at risk and will have consequences. Patient verbalized understanding in this matter. Dependence and abuse for these drugs will be monitored closely. A Controlled substance policy and procedure is on file which allows [facility] to order a urine drug screen test at any visit. Patient understands and agrees with the plan  This patient was seen by Leretha Pol FNP Collaboration with Dr Lavera Guise as a part of collaborative care agreement  Orders Placed This Encounter  Procedures  . UA/M w/rflx Culture, Routine  . Ambulatory referral to Gastroenterology  . Ambulatory referral to  Dermatology  . POCT Urine Drug Screen  . Home sleep test    Meds ordered this encounter  Medications  . ALPRAZolam (XANAX) 0.5 MG tablet    Sig: Take 1 tablet (0.5 mg total) by mouth 2 (two) times daily as needed for anxiety.    Dispense:  60 tablet    Refill:  2    Order Specific Question:   Supervising Provider    Answer:   Lavera Guise [id number]  . cyclobenzaprine (FLEXERIL) 10 MG tablet    Sig: Take 1 tablet (10 mg total) by mouth 3 (three) times daily as needed for muscle spasms.    Dispense:  90 tablet    Refill:  2    Order Specific Question:   Supervising Provider    Answer:   Lavera Guise [id number]  . omeprazole (PRILOSEC) 40 MG capsule    Sig: Take 1 capsule (40 mg total) by mouth daily.    Dispense:  30 capsule    Refill:  5    Order Specific Question:   Supervising Provider    Answer:   Lavera Guise [id number]  . traMADol (ULTRAM) 50 MG tablet    Sig: Take 1 tablet (50 mg total) by mouth every 6 (six) hours as needed.    Dispense:  30 tablet    Refill:  2    Order Specific Question:   Supervising Provider    Answer:   Lavera Guise [id number]    Time spent: [place], MD  Internal Medicine

## 2019-06-01 ENCOUNTER — Other Ambulatory Visit: Payer: Self-pay | Admitting: Nurse Practitioner

## 2019-06-01 DIAGNOSIS — E782 Mixed hyperlipidemia: Secondary | ICD-10-CM | POA: Diagnosis not present

## 2019-06-01 DIAGNOSIS — I10 Essential (primary) hypertension: Secondary | ICD-10-CM | POA: Diagnosis not present

## 2019-06-01 DIAGNOSIS — Z125 Encounter for screening for malignant neoplasm of prostate: Secondary | ICD-10-CM | POA: Diagnosis not present

## 2019-06-01 DIAGNOSIS — M064 Inflammatory polyarthropathy: Secondary | ICD-10-CM | POA: Diagnosis not present

## 2019-06-01 DIAGNOSIS — Z0001 Encounter for general adult medical examination with abnormal findings: Secondary | ICD-10-CM | POA: Diagnosis not present

## 2019-06-01 LAB — UA/M W/RFLX CULTURE, ROUTINE

## 2019-06-02 LAB — COMPREHENSIVE METABOLIC PANEL
ALT: 22 IU/L (ref 0–44)
AST: 28 IU/L (ref 0–40)
Albumin/Globulin Ratio: 1.8 (ref 1.2–2.2)
Albumin: 4.5 g/dL (ref 3.8–4.9)
Alkaline Phosphatase: 89 IU/L (ref 39–117)
BUN/Creatinine Ratio: 13 (ref 9–20)
BUN: 14 mg/dL (ref 6–24)
Bilirubin Total: 0.3 mg/dL (ref 0.0–1.2)
CO2: 21 mmol/L (ref 20–29)
Calcium: 9.5 mg/dL (ref 8.7–10.2)
Chloride: 103 mmol/L (ref 96–106)
Creatinine, Ser: 1.1 mg/dL (ref 0.76–1.27)
GFR calc Af Amer: 86 mL/min/{1.73_m2} (ref >59)
GFR calc non Af Amer: 75 mL/min/{1.73_m2} (ref >59)
Globulin, Total: 2.5 g/dL (ref 1.5–4.5)
Glucose: 113 mg/dL — ABNORMAL HIGH (ref 65–99)
Potassium: 4.6 mmol/L (ref 3.5–5.2)
Sodium: 138 mmol/L (ref 134–144)
Total Protein: 7 g/dL (ref 6.0–8.5)

## 2019-06-02 LAB — TSH: TSH: 3.01 u[IU]/mL (ref 0.450–4.500)

## 2019-06-02 LAB — CBC
Hematocrit: 46.1 % (ref 37.5–51.0)
Hemoglobin: 15.6 g/dL (ref 13.0–17.7)
MCH: 29.7 pg (ref 26.6–33.0)
MCHC: 33.8 g/dL (ref 31.5–35.7)
MCV: 88 fL (ref 79–97)
Platelets: 271 10*3/uL (ref 150–450)
RBC: 5.25 x10E6/uL (ref 4.14–5.80)
RDW: 13.4 % (ref 11.6–15.4)
WBC: 7.7 10*3/uL (ref 3.4–10.8)

## 2019-06-02 LAB — URIC ACID: Uric Acid: 6.5 mg/dL (ref 3.7–8.6)

## 2019-06-02 LAB — LIPID PANEL W/O CHOL/HDL RATIO
Cholesterol, Total: 236 mg/dL — ABNORMAL HIGH (ref 100–199)
HDL: 39 mg/dL — ABNORMAL LOW (ref >39)
LDL Chol Calc (NIH): 173 mg/dL — ABNORMAL HIGH (ref 0–99)
Triglycerides: 130 mg/dL (ref 0–149)
VLDL Cholesterol Cal: 24 mg/dL (ref 5–40)

## 2019-06-02 LAB — PSA: Prostate Specific Ag, Serum: 0.4 ng/mL (ref 0.0–4.0)

## 2019-06-02 LAB — HGB A1C W/O EAG: Hgb A1c MFr Bld: 5.8 % — ABNORMAL HIGH (ref 4.8–5.6)

## 2019-06-02 LAB — T4, FREE: Free T4: 0.89 ng/dL (ref 0.82–1.77)

## 2019-06-05 ENCOUNTER — Ambulatory Visit: Payer: Medicare Other | Admitting: Internal Medicine

## 2019-06-05 ENCOUNTER — Other Ambulatory Visit: Payer: Self-pay

## 2019-06-05 DIAGNOSIS — G4733 Obstructive sleep apnea (adult) (pediatric): Secondary | ICD-10-CM

## 2019-06-08 NOTE — Procedures (Signed)
[facility] [place], [place] [address]  Sleep Specialist: Allyne Gee, MD [facility] Sleep Study Interpretation  Patient Name: Zachary Holmes Patient MR [id number] DOB:[birth year]  Date of Study: June 05, 2019  Indications for study: Hypersomnia  BMI: 34.7 kg/m       Respiratory Data:  Total AHI: 19.5/h  Total Obstructive Apneas: 27  Total Central Apneas: 5  Total Mixed Apneas: 0  Total Hypopneas: 58  If the AHI is greater than 5 per hour patient qualifies for PAP evaluation  Oximetry Data:  Oxygen Desaturation Index: 14.8/h  Lowest Desaturation: 83%  Cardiac Data:  Minimum Heart Rate: 51  Maximum Heart Rate: 108   Impression / Diagnosis:  This apnea study demonstrates presence of moderate obstructive sleep apnea with an AHI of 19.5/h.  CPAP titration in the lab should be ordered for optimal pressure  GENERAL Recommendations:  1.  Consider Auto PAP with pressure ranges 5-20 cmH20 with download, or facility based PAP Titration Study  2.  Consider PAP interface mask fitted for patient comfort, Heated Humidification & PAP compliance monitoring (1 month, 3 months & 12 months after PAP initiation)  3. Consider treatment with mandibular advancement splint (MAS) or referral to an ENT surgeon for modification to the upper airway if the patient prefers an alternate therapy or the PAP trial is unsuccessful  4. Sleep hygiene measures should be discussed with the patient  5. Behavioral therapy such as weight reduction or smoking cessation as appropriate for the patient  6. Advise patient against the use of alcohol or sedatives in so much as these substances can worsen excessive daytime sleepiness and respiratory disturbances of sleep  7. Advise patient against participating in potentially dangerous activities while drowsy such as operating a motor vehicle, heavy equipment or power tools as it can put them and others in  danger  8. Advise patient of the long term consequences of OSA if left untreated, need for treatment and close follow up  9. Clinical follow up as deemed necessary     This Level III home sleep study was performed using the [employer], a 4 channel screening device subject to limitations. Depending on actual total sleep time, not measured in this study, the AHI (sum of apneas and hypopneas/hr of sleep) and therefore the severity of sleep apnea may be underestimated. As with any single night study, including Level 1 attended PSG, severity of sleep apnea may also be underestimated due to the lack of supine and/or REM sleep.  The interpretation associated with this report is based on normal values and degrees of severity in accordance with AASM parameters and/or estimated from multiple sources in the literature for adults ages 68-80+. These may not agree with the displayed values. The patient's treating physician should use the interpretation and recommendations in conjunction with the overall clinical evaluation and treatment of the patient.  Some of the terminology used in this scored [facility] report was developed several years ago and may not always be in accordance with current nomenclature. This in no way affects the accuracy of the data or the reliability of the interpretation and recommendations.

## 2019-06-08 NOTE — Progress Notes (Signed)
Cholesterol a bit elevated. Labs otherwise good. Discuss at next office visit.

## 2019-06-13 ENCOUNTER — Encounter: Payer: Self-pay | Admitting: Internal Medicine

## 2019-06-13 ENCOUNTER — Other Ambulatory Visit: Payer: Self-pay

## 2019-06-13 ENCOUNTER — Ambulatory Visit: Payer: Medicare Other | Admitting: Internal Medicine

## 2019-06-13 VITALS — BP 144/88 | HR 73 | Temp 97.5°F | Resp 16 | Ht 72.0 in | Wt 265.0 lb

## 2019-06-13 DIAGNOSIS — G4733 Obstructive sleep apnea (adult) (pediatric): Secondary | ICD-10-CM | POA: Diagnosis not present

## 2019-06-13 DIAGNOSIS — I10 Essential (primary) hypertension: Secondary | ICD-10-CM

## 2019-06-13 DIAGNOSIS — R0683 Snoring: Secondary | ICD-10-CM

## 2019-06-13 DIAGNOSIS — K219 Gastro-esophageal reflux disease without esophagitis: Secondary | ICD-10-CM | POA: Diagnosis not present

## 2019-06-13 NOTE — Progress Notes (Signed)
[facility] [place], [place] [address]  Pulmonary Sleep Medicine   Office Visit Note  Patient Name: Zachary Holmes DOB: 1961-05-11 MRN [id number]  Date of Service: 06/13/2019  Complaints/HPI: Pt is here to follow up on sleep study.  His study shows 27 obstructive apneas and 5 central apneas and 58 hypopneas this gives an overall AHI of 19.5.  The oxygen desaturation was 83%.  He reports continued symptoms of fatigue, and loud snoring.Marland Kitchen    ROS  General: (-) fever, (-) chills, (-) night sweats, (-) weakness Skin: (-) rashes, (-) itching,. Eyes: (-) visual changes, (-) redness, (-) itching. Nose and Sinuses: (-) nasal stuffiness or itchiness, (-) postnasal drip, (-) nosebleeds, (-) sinus trouble. Mouth and Throat: (-) sore throat, (-) hoarseness. Neck: (-) swollen glands, (-) enlarged thyroid, (-) neck pain. Respiratory: - cough, (-) bloody sputum, - shortness of breath, - wheezing. Cardiovascular: - ankle swelling, (-) chest pain. Lymphatic: (-) lymph node enlargement. Neurologic: (-) numbness, (-) tingling. Psychiatric: (-) anxiety, (-) depression   Current Medication: Outpatient Encounter Medications as of 06/13/2019  Medication Sig  . ALPRAZolam (XANAX) 0.5 MG tablet Take 1 tablet (0.5 mg total) by mouth 2 (two) times daily as needed for anxiety.  . cyclobenzaprine (FLEXERIL) 10 MG tablet Take 1 tablet (10 mg total) by mouth 3 (three) times daily as needed for muscle spasms.  Marland Kitchen lisinopril (ZESTRIL) 20 MG tablet Take 1 tablet (20 mg total) by mouth daily.  Marland Kitchen omeprazole (PRILOSEC) 40 MG capsule Take 1 capsule (40 mg total) by mouth daily.  . traMADol (ULTRAM) 50 MG tablet Take 1 tablet (50 mg total) by mouth every 6 (six) hours as needed.   No facility-administered encounter medications on file as of 06/13/2019.     Surgical History: Past Surgical History:  Procedure Laterality Date  . ANKLE FUSION Right 11/24/2007   Archie Endo 12/04/2010  .  COLONOSCOPY WITH PROPOFOL N/A 05/13/2017   Procedure: COLONOSCOPY WITH PROPOFOL;  Surgeon: Lucilla Lame, MD;  Location: [place];  Service: Endoscopy;  Laterality: N/A;  requests early  . FRACTURE SURGERY    . JOINT REPLACEMENT    . KNEE ARTHROCENTESIS Left X 5   "got infected . . ."  . MULTIPLE TOOTH EXTRACTIONS    . SHOULDER ARTHROSCOPY Left 06/2007   Archie Endo 12/03/2010  . TOTAL KNEE ARTHROPLASTY Left 2008   Archie Endo 04/09/2010  . TOTAL SHOULDER ARTHROPLASTY Left 09/20/2014  . TOTAL SHOULDER ARTHROPLASTY Left 09/20/2014   Procedure: TOTAL SHOULDER ARTHROPLASTY;  Surgeon: Nita Sells, MD;  Location: [place];  Service: Orthopedics;  Laterality: Left;  Left total shoulder arthroplasty  . TOTAL SHOULDER ARTHROPLASTY Right 06/02/2018   Procedure: RIGHT TOTAL SHOULDER ARTHROPLASTY;  Surgeon: Tania Ade, MD;  Location: [place];  Service: Orthopedics;  Laterality: Right;    Medical History: Past Medical History:  Diagnosis Date  . Anxiety   . Arthritis    "legs" (09/20/2014)  . Depression   . GERD (gastroesophageal reflux disease)   . Hypertension   . OA (osteoarthritis) of shoulder    right  . Seizures ([place]) 1983   x1, S/P head injury  . Serratia marcescens infection 2011   left knee  . Umbilical hernia   . Wears dentures    partial upper    Family History: Family History  Problem Relation Age of Onset  . COPD Mother   . Heart disease Father     Social History: Social History   Socioeconomic History  .  Marital status: Single    Spouse name: Not on file  . Number of children: Not on file  . Years of education: Not on file  . Highest education level: Not on file  Occupational History  . Not on file  Social Needs  . Financial resource strain: Not on file  . Food insecurity    Worry: Not on file    Inability: Not on file  . Transportation needs    Medical: Not on file    Non-medical: Not on file  Tobacco Use  . Smoking status: Current Every Day  Smoker    Packs/day: 0.50    Years: 20.00    Pack years: 10.00    Types: Cigarettes  . Smokeless tobacco: Former Systems developer    Types: Chew  . Tobacco comment: pcp PCP had given pt patch  Substance and Sexual Activity  . Alcohol use: Not Currently    Alcohol/week: 12.0 standard drinks    Types: 12 Cans of beer per week    Comment: SOBER SINCE 05/17/2017  . Drug use: Yes    Types: Marijuana    Comment: DAILY  . Sexual activity: Not Currently  Lifestyle  . Physical activity    Days per week: Not on file    Minutes per session: Not on file  . Stress: Not on file  Relationships  . Social Herbalist on phone: Not on file    Gets together: Not on file    Attends religious service: Not on file    Active member of club or organization: Not on file    Attends meetings of clubs or organizations: Not on file    Relationship status: Not on file  . Intimate partner violence    Fear of current or ex partner: Not on file    Emotionally abused: Not on file    Physically abused: Not on file    Forced sexual activity: Not on file  Other Topics Concern  . Not on file  Social History Narrative  . Not on file    Vital Signs: Blood pressure (!) 144/88, pulse 73, temperature (!) 97.5 F (36.4 C), resp. rate 16, height 6' (1.829 m), weight 265 lb (120.2 kg), SpO2 95 %.  Examination: General Appearance: The patient is well-developed, well-nourished, and in no distress. Skin: Gross inspection of skin unremarkable. Head: normocephalic, no gross deformities. Eyes: no gross deformities noted. ENT: ears appear grossly normal no exudates. Neck: Supple. No thyromegaly. No LAD. Respiratory: clear bilaterally. Cardiovascular: Normal S1 and S2 without murmur or rub. Extremities: No cyanosis. pulses are equal. Neurologic: Alert and oriented. No involuntary movements.  LABS: Recent Results (from the past 2160 hour(s))  UA/M w/rflx Culture, Routine     Status: None   Collection Time:  05/30/19  9:01 AM   Specimen: Urine   URINE  Result Value Ref Range   Specific Gravity, UA CANCELED     Comment: Test not performed. Insufficient specimen to perform or complete analysis.  Result canceled by the ancillary.    pH, UA CANCELED     Comment: Test not performed  Result canceled by the ancillary.    Protein,UA CANCELED     Comment: Test not performed  Result canceled by the ancillary.    Glucose, UA CANCELED     Comment: Test not performed  Result canceled by the ancillary.    Ketones, UA CANCELED     Comment: Test not performed  Result canceled by the ancillary.  POCT Urine Drug Screen     Status: Abnormal   Collection Time: 05/30/19  9:36 AM  Result Value Ref Range   POC METHAMPHETAMINE UR None Detected None Detected   POC Opiate Ur None Detected None Detected   POC Barbiturate UR None Detected None Detected   POC Amphetamine UR None Detected None Detected   POC Oxycodone UR None Detected None Detected   POC Cocaine UR None Detected None Detected   POC Ecstasy UR None Detected None Detected   POC TRICYCLICS UR Positive (A) None Detected   POC PHENCYCLIDINE UR None Detected None Detected   POC MARIJUANA UR Positive (A) None Detected   POC METHADONE UR None Detected None Detected   POC BENZODIAZEPINES UR None Detected None Detected   URINE TEMPERATURE     POC DRUG SCREEN OXIDANTS URINE     POC SPECIFIC GRAVITY URINE     POC PH URINE     Methylenedioxyamphetamine    Comprehensive metabolic panel     Status: Abnormal   Collection Time: 06/01/19 11:38 AM  Result Value Ref Range   Glucose 113 (H) 65 - 99 mg/dL   BUN 14 6 - 24 mg/dL   Creatinine, Ser 1.10 0.76 - 1.27 mg/dL   GFR calc non Af Amer 75 >59 mL/min/1.73   GFR calc Af Amer 86 >59 mL/min/1.73   BUN/Creatinine Ratio 13 9 - 20   Sodium 138 134 - 144 mmol/L   Potassium 4.6 3.5 - 5.2 mmol/L   Chloride 103 96 - 106 mmol/L   CO2 21 20 - 29 mmol/L   Calcium 9.5 8.7 - 10.2 mg/dL   Total Protein  7.0 6.0 - 8.5 g/dL   Albumin 4.5 3.8 - 4.9 g/dL   Globulin, Total 2.5 1.5 - 4.5 g/dL   Albumin/Globulin Ratio 1.8 1.2 - 2.2   Bilirubin Total 0.3 0.0 - 1.2 mg/dL   Alkaline Phosphatase 89 39 - 117 IU/L   AST 28 0 - 40 IU/L   ALT 22 0 - 44 IU/L  CBC     Status: None   Collection Time: 06/01/19 11:38 AM  Result Value Ref Range   WBC 7.7 3.4 - 10.8 x10E3/uL   RBC 5.25 4.14 - 5.80 x10E6/uL   Hemoglobin 15.6 13.0 - 17.7 g/dL   Hematocrit 46.1 37.5 - 51.0 %   MCV 88 79 - 97 fL   MCH 29.7 26.6 - 33.0 pg   MCHC 33.8 31.5 - 35.7 g/dL   RDW 13.4 11.6 - 15.4 %   Platelets 271 150 - 450 x10E3/uL  Lipid Panel w/o Chol/HDL Ratio     Status: Abnormal   Collection Time: 06/01/19 11:38 AM  Result Value Ref Range   Cholesterol, Total 236 (H) 100 - 199 mg/dL   Triglycerides 130 0 - 149 mg/dL   HDL 39 (L) >39 mg/dL   VLDL Cholesterol Cal 24 5 - 40 mg/dL   LDL Chol Calc (NIH) 173 (H) 0 - 99 mg/dL  Hgb A1c w/o eAG     Status: Abnormal   Collection Time: 06/01/19 11:38 AM  Result Value Ref Range   Hgb A1c MFr Bld 5.8 (H) 4.8 - 5.6 %    Comment:          Prediabetes: 5.7 - 6.4          Diabetes: >6.4          Glycemic control for adults with diabetes: <7.0   T4, free  Status: None   Collection Time: 06/01/19 11:38 AM  Result Value Ref Range   Free T4 0.89 0.82 - 1.77 ng/dL  TSH     Status: None   Collection Time: 06/01/19 11:38 AM  Result Value Ref Range   TSH 3.010 0.450 - 4.500 uIU/mL  PSA     Status: None   Collection Time: 06/01/19 11:38 AM  Result Value Ref Range   Prostate Specific Ag, Serum 0.4 0.0 - 4.0 ng/mL    Comment: Roche ECLIA methodology. According to the American Urological Association, Serum PSA should decrease and remain at undetectable levels after radical prostatectomy. The AUA defines biochemical recurrence as an initial PSA value 0.2 ng/mL or greater followed by a subsequent confirmatory PSA value 0.2 ng/mL or greater. Values obtained with different assay  methods or kits cannot be used interchangeably. Results cannot be interpreted as absolute evidence of the presence or absence of malignant disease.   Uric acid     Status: None   Collection Time: 06/01/19 11:38 AM  Result Value Ref Range   Uric Acid 6.5 3.7 - 8.6 mg/dL    Comment:            Therapeutic target for gout patients: <6.0    Radiology: Dg Chest 2 View  Result Date: 05/25/2018 CLINICAL DATA:  Preop for right total shoulder arthroplasty. EXAM: CHEST - 2 VIEW COMPARISON:  Radiographs of April 02, 2010. FINDINGS: The heart size and mediastinal contours are within normal limits. Both lungs are clear. The visualized skeletal structures are unremarkable. IMPRESSION: No active cardiopulmonary disease. Electronically Signed   By: Marijo Conception, M.D.   On: 05/25/2018 15:44    No results found.  No results found.    Assessment and Plan: Patient Active Problem List   Diagnosis Date Noted  . Other dysphagia 05/30/2019  . Loud snoring 05/30/2019  . Neoplasm of uncertain behavior of skin of lower extremity 05/30/2019  . Encounter for long-term (current) use of medications 05/30/2019  . S/P shoulder replacement, right 06/02/2018  . Encounter for general adult medical examination with abnormal findings 05/27/2018  . Dysuria 05/27/2018  . Gastroesophageal reflux disease without esophagitis 02/16/2018  . Chronic right shoulder pain 02/16/2018  . Cramp and spasm 02/16/2018  . Chronic osteoarthritis 02/16/2018  . GAD (generalized anxiety disorder) 02/16/2018  . Hx of colonic polyps   . Benign neoplasm of ascending colon   . Benign neoplasm of transverse colon   . Osteoarthritis of left shoulder 09/20/2014  . Essential hypertension 04/16/2010  . KNEE JOINT REPLACEMENT BY OTHER MEANS 04/16/2010  . OTH INFS INVOLVING BONE DZ CLASS ELSW LOWER LEG 04/01/2010    1. OSA (obstructive sleep apnea) Sleep study reveals OSA,  cpap device ordered at this visit, pt to begin therapy and  follow up accordingly.  - For home use only DME continuous positive airway pressure (CPAP)  2. Loud snoring Likely from OSA, cpap therapy is ordered.  3. Essential hypertension 144/88 today.  Continue current meds, follow up with PCP.   4. Gastroesophageal reflux disease without esophagitis Controlled on Omeprazole.  Continue current medication.  General Counseling: I have discussed the findings of the evaluation and examination with Zachary Holmes.  I have also discussed any further diagnostic evaluation thatmay be needed or ordered today. Ilyaas verbalizes understanding of the findings of todays visit. We also reviewed his medications today and discussed drug interactions and side effects including but not limited excessive drowsiness and altered mental states. We also discussed  that there is always a risk not just to him but also people around him. he has been encouraged to call the office with any questions or concerns that should arise related to todays visit.    Time spent: 25  I have personally obtained a history, examined the patient, evaluated laboratory and imaging results, formulated the assessment and plan and placed orders.    Allyne Gee, MD [facility] Pulmonary and Critical Care Sleep medicine

## 2019-06-14 ENCOUNTER — Telehealth: Payer: Self-pay

## 2019-06-14 NOTE — Telephone Encounter (Signed)
Left message and asked pt to call back regarding cpap information and pricing due to benefits. Beth

## 2019-06-14 NOTE — Progress Notes (Signed)
Pt had appt 06/13/19 and I am working on cpap auth. Beth

## 2019-06-14 NOTE — Progress Notes (Signed)
Hey. He should have follow up with Adam or Dr. Devona Konig to discuss sleep study results. Thanks.

## 2019-07-06 ENCOUNTER — Telehealth: Payer: Self-pay

## 2019-07-06 NOTE — Telephone Encounter (Signed)
[facility]BAL ORDER FOR MEDICAL EQUIPMENT SIGNED AND PLACED IN [employer] PATIENT FOLDER.

## 2019-07-10 ENCOUNTER — Other Ambulatory Visit: Payer: Self-pay

## 2019-07-10 ENCOUNTER — Ambulatory Visit (INDEPENDENT_AMBULATORY_CARE_PROVIDER_SITE_OTHER): Payer: Medicare Other | Admitting: Gastroenterology

## 2019-07-10 ENCOUNTER — Encounter: Payer: Self-pay | Admitting: Gastroenterology

## 2019-07-10 ENCOUNTER — Encounter (INDEPENDENT_AMBULATORY_CARE_PROVIDER_SITE_OTHER): Payer: Self-pay

## 2019-07-10 VITALS — BP 148/104 | HR 81 | Temp 97.6°F | Ht 72.0 in | Wt 270.2 lb

## 2019-07-10 DIAGNOSIS — R1319 Other dysphagia: Secondary | ICD-10-CM | POA: Diagnosis not present

## 2019-07-10 NOTE — Progress Notes (Signed)
Gastroenterology Consultation  Referring Provider:     Ronnell Freshwater, NP Primary Care Physician:  Ronnell Freshwater, NP Primary Gastroenterologist:  Dr. Allen Norris     Reason for Consultation:     Dysphagia        HPI:   Zachary Holmes is a 58 y.o. y/o male referred for consultation & management of dysphagia by Dr. Ronnell Freshwater, NP.  This patient comes in today with a history of dysphagia.  The patient also has a longstanding history of GERD.  The patient has seen me in the past for colonoscopy with adenomatous polyps back in 2016 and 2018.  The patient is presently prescribed omeprazole 40 mg once a day. The patient denies any acid breakthrough and states that his dysphagia manifests itself by saliva going down the wrong pipe causing him to choke and cough.  He is having any bouts with solid foods such as steak chicken or bread.  The patient denies any unexplained weight loss fevers chills nausea or vomiting.  He also denies any black stools or bloody stools.  The patient denies any acid breakthrough on his omeprazole.   Past Medical History:  Diagnosis Date  . Anxiety   . Arthritis    "legs" (09/20/2014)  . Depression   . GERD (gastroesophageal reflux disease)   . Hypertension   . OA (osteoarthritis) of shoulder    right  . Seizures ([place]) 1983   x1, S/P head injury  . Serratia marcescens infection 2011   left knee  . Umbilical hernia   . Wears dentures    partial upper    Past Surgical History:  Procedure Laterality Date  . ANKLE FUSION Right 11/24/2007   Archie Endo 12/04/2010  . COLONOSCOPY WITH PROPOFOL N/A 05/13/2017   Procedure: COLONOSCOPY WITH PROPOFOL;  Surgeon: Lucilla Lame, MD;  Location: [facility];  Service: Endoscopy;  Laterality: N/A;  requests early  . FRACTURE SURGERY    . JOINT REPLACEMENT    . KNEE ARTHROCENTESIS Left X 5   "got infected . . ."  . MULTIPLE TOOTH EXTRACTIONS    . SHOULDER ARTHROSCOPY Left 06/2007   Archie Endo 12/03/2010  . TOTAL  KNEE ARTHROPLASTY Left 2008   Archie Endo 04/09/2010  . TOTAL SHOULDER ARTHROPLASTY Left 09/20/2014  . TOTAL SHOULDER ARTHROPLASTY Left 09/20/2014   Procedure: TOTAL SHOULDER ARTHROPLASTY;  Surgeon: Nita Sells, MD;  Location: [facility];  Service: Orthopedics;  Laterality: Left;  Left total shoulder arthroplasty  . TOTAL SHOULDER ARTHROPLASTY Right 06/02/2018   Procedure: RIGHT TOTAL SHOULDER ARTHROPLASTY;  Surgeon: Tania Ade, MD;  Location: [facility];  Service: Orthopedics;  Laterality: Right;    Prior to Admission medications   Medication Sig Start Date End Date Taking? Authorizing Provider  ALPRAZolam [name] 0.5 MG tablet Take 1 tablet (0.5 mg total) by mouth 2 (two) times daily as needed for anxiety. 05/30/19   Ronnell Freshwater, NP  cyclobenzaprine (FLEXERIL) 10 MG tablet Take 1 tablet (10 mg total) by mouth 3 (three) times daily as needed for muscle spasms. 05/30/19   Ronnell Freshwater, NP  lisinopril (ZESTRIL) 20 MG tablet Take 1 tablet (20 mg total) by mouth daily. 04/25/19   Ronnell Freshwater, NP  omeprazole (PRILOSEC) 40 MG capsule Take 1 capsule (40 mg total) by mouth daily. 05/30/19   Ronnell Freshwater, NP  traMADol (ULTRAM) 50 MG tablet Take 1 tablet (50 mg total) by mouth every 6 (six) hours as needed. 05/30/19   Boscia,  Greer Ee, NP    Family History  Problem Relation Age of Onset  . COPD Mother   . Heart disease Father      Social History   Tobacco Use  . Smoking status: Current Every Day Smoker    Packs/day: 0.50    Years: 20.00    Pack years: 10.00    Types: Cigarettes  . Smokeless tobacco: Former Systems developer    Types: Chew  . Tobacco comment: pcp PCP had given pt patch  Substance Use Topics  . Alcohol use: Not Currently    Alcohol/week: 12.0 standard drinks    Types: 12 Cans of beer per week    Comment: SOBER SINCE 05/17/2017  . Drug use: Yes    Types: Marijuana    Comment: DAILY    Allergies as of 07/10/2019  . (No Known Allergies)    Review of  Systems:    All systems reviewed and negative except where noted in HPI.   Physical Exam:  There were no vitals taken for this visit. No LMP for male patient. General:   Alert,  Well-developed, well-nourished, pleasant and cooperative in NAD Head:  Normocephalic and atraumatic. Eyes:  Sclera clear, no icterus.   Conjunctiva pink. Ears:  Normal auditory acuity. Nose:  No deformity, discharge, or lesions. Mouth:  No deformity or lesions,oropharynx pink & moist. Neck:  Supple; no masses or thyromegaly. Lungs:  Respirations even and unlabored.  Clear throughout to auscultation.   No wheezes, crackles, or rhonchi. No acute distress. Heart:  Regular rate and rhythm; no murmurs, clicks, rubs, or gallops. Abdomen:  Normal bowel sounds.  No bruits.  Soft, non-tender and non-distended without masses, hepatosplenomegaly or hernias noted.  No guarding or rebound tenderness.  Negative Carnett sign.   Rectal:  Deferred.  Msk:  Symmetrical without gross deformities.  Good, equal movement & strength bilaterally. Pulses:  Normal pulses noted. Extremities:  No clubbing or edema.  No cyanosis. Neurologic:  Alert and oriented x3;  grossly normal neurologically. Skin:  Intact without significant lesions or rashes.  No jaundice. Lymph Nodes:  No significant cervical adenopathy. Psych:  Alert and cooperative. Normal mood and affect.  Imaging Studies: No results found.  Assessment and Plan:   Zachary Holmes is a 58 y.o. y/o male who comes in today with a history of choking that he reports to usually happen when he is walking down the street and not while he is eating.  The patient states that saliva goes down the wrong pipe and he starts to cough uncontrollably.  He states that it can happen without any warning.  The patient has a longstanding history of heartburn and has not had an upper endoscopy therefore will be set up for an upper endoscopy.  The patient will also be set up for a modified barium swallow  to see if there is any cause for his symptoms.  The patient has also been told that he is due for his next colonoscopy in 2023.  The patient has been explained the plan and agrees with it.    Lucilla Lame, MD. Marval Regal    Note: This dictation was prepared with Dragon dictation along with smaller phrase technology. Any transcriptional errors that result from this process are unintentional.

## 2019-07-13 ENCOUNTER — Telehealth: Payer: Self-pay

## 2019-07-13 NOTE — Telephone Encounter (Signed)
Per DME company [employer] patient has benefits for new cpap. I left a detailed message on patient voice mail and advised set up cost $98.78 and monthly fee $ 11.80 and asked pt to call back for in clinic set up. Beth

## 2019-08-09 ENCOUNTER — Other Ambulatory Visit: Payer: Self-pay

## 2019-08-09 DIAGNOSIS — R1319 Other dysphagia: Secondary | ICD-10-CM

## 2019-08-16 ENCOUNTER — Ambulatory Visit
Admission: RE | Admit: 2019-08-16 | Discharge: 2019-08-16 | Disposition: A | Discharge status: Home / Self Care | Payer: Medicare HMO | Source: Ambulatory Visit | Attending: Gastroenterology | Admitting: Gastroenterology

## 2019-08-16 ENCOUNTER — Other Ambulatory Visit: Payer: Self-pay

## 2019-08-16 ENCOUNTER — Other Ambulatory Visit: Payer: Self-pay | Admitting: Gastroenterology

## 2019-08-16 DIAGNOSIS — K219 Gastro-esophageal reflux disease without esophagitis: Secondary | ICD-10-CM | POA: Diagnosis not present

## 2019-08-16 DIAGNOSIS — R1319 Other dysphagia: Secondary | ICD-10-CM

## 2019-08-18 ENCOUNTER — Other Ambulatory Visit: Payer: Self-pay

## 2019-08-24 ENCOUNTER — Telehealth: Payer: Self-pay

## 2019-08-24 ENCOUNTER — Other Ambulatory Visit: Payer: Self-pay

## 2019-08-24 DIAGNOSIS — R1319 Other dysphagia: Secondary | ICD-10-CM

## 2019-08-24 NOTE — Telephone Encounter (Signed)
-----   Message from Lucilla Lame, MD sent at 08/16/2019  6:07 PM EST ----- Let the patient know that his barium swallow showed some reflux but otherwise normal.  He should be set up for a modified barium swallow if he has not already been set up for one.

## 2019-08-24 NOTE — Telephone Encounter (Signed)
LVM for pt to return my call.

## 2019-08-28 NOTE — Telephone Encounter (Signed)
LVM again today for pt to return my call.

## 2019-08-29 ENCOUNTER — Telehealth: Payer: Self-pay

## 2019-08-29 NOTE — Telephone Encounter (Signed)
Called lmom informing patient of virtual visit. klh 

## 2019-08-30 ENCOUNTER — Telehealth: Payer: Self-pay

## 2019-08-30 NOTE — Telephone Encounter (Signed)
Confirmed telephone visit with patient.klh 

## 2019-08-31 ENCOUNTER — Other Ambulatory Visit: Payer: Self-pay

## 2019-08-31 ENCOUNTER — Ambulatory Visit (INDEPENDENT_AMBULATORY_CARE_PROVIDER_SITE_OTHER): Payer: Medicare HMO | Admitting: Adult Health

## 2019-08-31 ENCOUNTER — Encounter: Payer: Self-pay | Admitting: Adult Health

## 2019-08-31 VITALS — Ht 72.0 in

## 2019-08-31 DIAGNOSIS — R252 Cramp and spasm: Secondary | ICD-10-CM | POA: Diagnosis not present

## 2019-08-31 DIAGNOSIS — F411 Generalized anxiety disorder: Secondary | ICD-10-CM

## 2019-08-31 DIAGNOSIS — F17208 Nicotine dependence, unspecified, with other nicotine-induced disorders: Secondary | ICD-10-CM

## 2019-08-31 DIAGNOSIS — M1990 Unspecified osteoarthritis, unspecified site: Secondary | ICD-10-CM

## 2019-08-31 DIAGNOSIS — K219 Gastro-esophageal reflux disease without esophagitis: Secondary | ICD-10-CM | POA: Diagnosis not present

## 2019-08-31 MED ORDER — TRAMADOL HCL 50 MG PO TABS: 50.0000 mg | ORAL_TABLET | Freq: Four times a day (QID) | ORAL | 2 refills | Status: DC | PRN

## 2019-08-31 MED ORDER — NICOTINE 21 MG/24HR TD PT24: 21.0000 mg | MEDICATED_PATCH | Freq: Every day | TRANSDERMAL | 0 refills | Status: DC

## 2019-08-31 MED ORDER — ALPRAZOLAM 0.5 MG PO TABS: 0.5000 mg | ORAL_TABLET | Freq: Two times a day (BID) | ORAL | 2 refills | Status: DC | PRN

## 2019-08-31 MED ORDER — OMEPRAZOLE 40 MG PO CPDR: 40.0000 mg | DELAYED_RELEASE_CAPSULE | Freq: Every day | ORAL | 5 refills | Status: DC

## 2019-08-31 MED ORDER — CYCLOBENZAPRINE HCL 10 MG PO TABS: 10.0000 mg | ORAL_TABLET | Freq: Three times a day (TID) | ORAL | 2 refills | Status: DC | PRN

## 2019-08-31 NOTE — Progress Notes (Signed)
[facility] [place], [place] [address]  Internal MEDICINE  Telephone Visit  Patient Name: Zachary Holmes  [id number]  [id number]  Date of Service: 08/31/2019  I connected with the patient at 905 by telephone and verified the patients identity using two identifiers.   I discussed the limitations, risks, security and privacy concerns of performing an evaluation and management service by telephone and the availability of in person appointments. I also discussed with the patient that there may be a patient responsible charge related to the service.  The patient expressed understanding and agrees to proceed.    Chief Complaint  Patient presents with  . Telephone Assessment    REVIEW LABS   . Telephone Screen  . Gastroesophageal Reflux  . Medication Refill    WOULD LIKE STOP SMOKING     HPI  Pt is seen via telephone. He is following up today on GERD, GAD, and chronic osteoarthritis. Overall he is doing well.  His Gerd and GAD are controlled with current medications.  He denies any side effects of medications.  Pt is interested in quitting smoking. He has tried patches in the past but it did not work. He has a history of TBI, with seizures so chantix is not recommended.    Current Medication: Outpatient Encounter Medications as of 08/31/2019  Medication Sig  . ALPRAZolam (XANAX) 0.5 MG tablet Take 1 tablet (0.5 mg total) by mouth 2 (two) times daily as needed for anxiety.  . cyclobenzaprine (FLEXERIL) 10 MG tablet Take 1 tablet (10 mg total) by mouth 3 (three) times daily as needed for muscle spasms.  Marland Kitchen omeprazole (PRILOSEC) 40 MG capsule Take 1 capsule (40 mg total) by mouth daily.  . traMADol (ULTRAM) 50 MG tablet Take 1 tablet (50 mg total) by mouth every 6 (six) hours as needed.  . [DISCONTINUED] ALPRAZolam (XANAX) 0.5 MG tablet Take 1 tablet (0.5 mg total) by mouth 2 (two) times daily as needed for anxiety.  . [DISCONTINUED] cyclobenzaprine (FLEXERIL) 10 MG  tablet Take 1 tablet (10 mg total) by mouth 3 (three) times daily as needed for muscle spasms.  . [DISCONTINUED] omeprazole (PRILOSEC) 40 MG capsule Take 1 capsule (40 mg total) by mouth daily.  . [DISCONTINUED] traMADol (ULTRAM) 50 MG tablet Take 1 tablet (50 mg total) by mouth every 6 (six) hours as needed.  . nicotine (NICODERM CQ - DOSED IN MG/24 HOURS) 21 mg/24hr patch Place 1 patch (21 mg total) onto the skin daily.  . [DISCONTINUED] lisinopril (ZESTRIL) 20 MG tablet Take 1 tablet (20 mg total) by mouth daily. (Patient not taking: Reported on 07/10/2019)   No facility-administered encounter medications on file as of 08/31/2019.    Surgical History: Past Surgical History:  Procedure Laterality Date  . ANKLE FUSION Right 11/24/2007   Archie Endo 12/04/2010  . COLONOSCOPY WITH PROPOFOL N/A 05/13/2017   Procedure: COLONOSCOPY WITH PROPOFOL;  Surgeon: Lucilla Lame, MD;  Location: [place];  Service: Endoscopy;  Laterality: N/A;  requests early  . FRACTURE SURGERY    . JOINT REPLACEMENT    . KNEE ARTHROCENTESIS Left X 5   "got infected . . ."  . MULTIPLE TOOTH EXTRACTIONS    . SHOULDER ARTHROSCOPY Left 06/2007   Archie Endo 12/03/2010  . TOTAL KNEE ARTHROPLASTY Left 2008   Archie Endo 04/09/2010  . TOTAL SHOULDER ARTHROPLASTY Left 09/20/2014  . TOTAL SHOULDER ARTHROPLASTY Left 09/20/2014   Procedure: TOTAL SHOULDER ARTHROPLASTY;  Surgeon: Nita Sells, MD;  Location: [place];  Service: Orthopedics;  Laterality: Left;  Left total shoulder arthroplasty  . TOTAL SHOULDER ARTHROPLASTY Right 06/02/2018   Procedure: RIGHT TOTAL SHOULDER ARTHROPLASTY;  Surgeon: Tania Ade, MD;  Location: [place] [facility];  Service: Orthopedics;  Laterality: Right;    Medical History: Past Medical History:  Diagnosis Date  . Anxiety   . Arthritis    "legs" (09/20/2014)  . Depression   . GERD (gastroesophageal reflux disease)   . Hypertension   . OA (osteoarthritis) of shoulder    right  . Seizures ([place]) 1983    x1, S/P head injury  . Serratia marcescens infection 2011   left knee  . Umbilical hernia   . Wears dentures    partial upper    Family History: Family History  Problem Relation Age of Onset  . COPD Mother   . Heart disease Father     Social History   Socioeconomic History  . Marital status: Single    Spouse name: Not on file  . Number of children: Not on file  . Years of education: Not on file  . Highest education level: Not on file  Occupational History  . Not on file  Tobacco Use  . Smoking status: Current Every Day Smoker    Packs/day: 0.50    Years: 20.00    Pack years: 10.00    Types: Cigarettes  . Smokeless tobacco: Former Systems developer    Types: Chew  . Tobacco comment: pcp PCP had given pt patch  Substance and Sexual Activity  . Alcohol use: Not Currently    Alcohol/week: 12.0 standard drinks    Types: 12 Cans of beer per week    Comment: SOBER SINCE 05/17/2017  . Drug use: Yes    Types: Marijuana    Comment: DAILY  . Sexual activity: Not Currently  Other Topics Concern  . Not on file  Social History Narrative  . Not on file   Social Determinants of Health   Financial Resource Strain:   . Difficulty of Paying Living Expenses: Not on file  Food Insecurity:   . Worried About Charity fundraiser in the Last Year: Not on file  . Ran Out of Food in the Last Year: Not on file  Transportation Needs:   . Lack of Transportation (Medical): Not on file  . Lack of Transportation (Non-Medical): Not on file  Physical Activity:   . Days of Exercise per Week: Not on file  . Minutes of Exercise per Session: Not on file  Stress:   . Feeling of Stress : Not on file  Social Connections:   . Frequency of Communication with Friends and Family: Not on file  . Frequency of Social Gatherings with Friends and Family: Not on file  . Attends Religious Services: Not on file  . Active Member of Clubs or Organizations: Not on file  . Attends Archivist Meetings: Not  on file  . Marital Status: Not on file  Intimate Partner Violence:   . Fear of Current or Ex-Partner: Not on file  . Emotionally Abused: Not on file  . Physically Abused: Not on file  . Sexually Abused: Not on file      Review of Systems  Constitutional: Negative.  Negative for chills, fatigue and unexpected weight change.  HENT: Negative.  Negative for congestion, rhinorrhea, sneezing and sore throat.   Eyes: Negative for redness.  Respiratory: Negative.  Negative for cough, chest tightness and shortness of breath.   Cardiovascular: Negative.  Negative for  chest pain and palpitations.  Gastrointestinal: Negative.  Negative for abdominal pain, constipation, diarrhea, nausea and vomiting.  Endocrine: Negative.   Genitourinary: Negative.  Negative for dysuria and frequency.  Musculoskeletal: Negative.  Negative for arthralgias, back pain, joint swelling and neck pain.  Skin: Negative.  Negative for rash.  Allergic/Immunologic: Negative.   Neurological: Negative.  Negative for tremors and numbness.  Hematological: Negative for adenopathy. Does not bruise/bleed easily.  Psychiatric/Behavioral: Negative.  Negative for behavioral problems, sleep disturbance and suicidal ideas. The patient is not nervous/anxious.     Vital Signs: Ht 6' (1.829 m)   BMI 36.65 kg/m    Observation/Objective:  Well sounding, NAD noted.   Assessment/Plan: 1. GAD (generalized anxiety disorder) Reviewed risks and possible side effects associated with taking opiates, benzodiazepines and other CNS depressants. Combination of these could cause dizziness and drowsiness. Advised patient not to drive or operate machinery when taking these medications, as patient's and other's life can be at risk and will have consequences. Patient verbalized understanding in this matter. Dependence and abuse for these drugs will be monitored closely. A Controlled substance policy and procedure is on file which allows [facility] to order a urine drug screen test at any visit. Patient understands and agrees with the plan - ALPRAZolam (XANAX) 0.5 MG tablet; Take 1 tablet (0.5 mg total) by mouth 2 (two) times daily as needed for anxiety.  Dispense: 60 tablet; Refill: 2  2. Gastroesophageal reflux disease without esophagitis Continue prilosec as directed.  - omeprazole (PRILOSEC) 40 MG capsule; Take 1 capsule (40 mg total) by mouth daily.  Dispense: 30 capsule; Refill: 5  3. Chronic osteoarthritis Reviewed risks and possible side effects associated with taking opiates, benzodiazepines and other CNS depressants. Combination of these could cause dizziness and drowsiness. Advised patient not to drive or operate machinery when taking these medications, as patient's and other's life can be at risk and will have consequences. Patient verbalized understanding in this matter. Dependence and abuse for these drugs will be monitored closely. A Controlled substance policy and procedure is on file which allows [facility] to order a urine drug screen test at any visit. Patient understands and agrees with the plan - traMADol (ULTRAM) 50 MG tablet; Take 1 tablet (50 mg total) by mouth every 6 (six) hours as needed.  Dispense: 30 tablet; Refill: 2  4. Cramp and spasm Refilled flexeril and advised patient on safe use.  - cyclobenzaprine (FLEXERIL) 10 MG tablet; Take 1 tablet (10 mg total) by mouth 3 (three) times daily as needed for muscle spasms.  Dispense: 90 tablet; Refill: 2  5. Nicotine dependence with other nicotine-induced disorder, unspecified nicotine product type Pt will call in 3 weeks to either refill 21mg  patches or step down to 14 mg patches.  Smoking cessation counseling: 1. Pt acknowledges the risks of long term smoking, she will try to quite smoking. 2. Options for different medications including nicotine products, chewing gum, patch etc, Wellbutrin and Chantix is discussed 3. Goal and date of compete  cessation is discussed 4. Total time spent in smoking cessation is 15 min.  - nicotine (NICODERM CQ - DOSED IN MG/24 HOURS) 21 mg/24hr patch; Place 1 patch (21 mg total) onto the skin daily.  Dispense: 28 patch; Refill: 0  General Counseling: Zachary Holmes understanding of the findings of today's phone visit and agrees with plan of treatment. I have discussed any further diagnostic evaluation that may be needed or ordered today. We also reviewed his  medications today. he has been encouraged to call the office with any questions or concerns that should arise related to todays visit.    No orders of the defined types were placed in this encounter.   Meds ordered this encounter  Medications  . nicotine (NICODERM CQ - DOSED IN MG/24 HOURS) 21 mg/24hr patch    Sig: Place 1 patch (21 mg total) onto the skin daily.    Dispense:  28 patch    Refill:  0  . omeprazole (PRILOSEC) 40 MG capsule    Sig: Take 1 capsule (40 mg total) by mouth daily.    Dispense:  30 capsule    Refill:  5  . cyclobenzaprine (FLEXERIL) 10 MG tablet    Sig: Take 1 tablet (10 mg total) by mouth 3 (three) times daily as needed for muscle spasms.    Dispense:  90 tablet    Refill:  2  . ALPRAZolam (XANAX) 0.5 MG tablet    Sig: Take 1 tablet (0.5 mg total) by mouth 2 (two) times daily as needed for anxiety.    Dispense:  60 tablet    Refill:  2  . traMADol (ULTRAM) 50 MG tablet    Sig: Take 1 tablet (50 mg total) by mouth every 6 (six) hours as needed.    Dispense:  30 tablet    Refill:  2    Time spent: [place] AGNP-C Internal medicine

## 2019-09-05 ENCOUNTER — Other Ambulatory Visit: Payer: Self-pay

## 2019-09-05 NOTE — Telephone Encounter (Signed)
No return call from pt. Result letter mailed.

## 2019-09-08 ENCOUNTER — Other Ambulatory Visit: Payer: Self-pay

## 2019-10-07 DIAGNOSIS — Z20822 Contact with and (suspected) exposure to covid-19: Secondary | ICD-10-CM | POA: Diagnosis not present

## 2019-10-07 DIAGNOSIS — Z20828 Contact with and (suspected) exposure to other viral communicable diseases: Secondary | ICD-10-CM | POA: Diagnosis not present

## 2019-11-28 ENCOUNTER — Telehealth: Payer: Self-pay

## 2019-11-28 NOTE — Telephone Encounter (Signed)
Called lmom informing patient of appointment on 11/30/2019. klh

## 2019-11-28 NOTE — Telephone Encounter (Signed)
Confirmed and screened for 11-30-19 ov. 

## 2019-11-30 ENCOUNTER — Encounter: Payer: Self-pay | Admitting: Adult Health

## 2019-11-30 ENCOUNTER — Other Ambulatory Visit: Payer: Self-pay

## 2019-11-30 ENCOUNTER — Ambulatory Visit (INDEPENDENT_AMBULATORY_CARE_PROVIDER_SITE_OTHER): Payer: Medicare HMO | Admitting: Adult Health

## 2019-11-30 VITALS — BP 130/82 | HR 82 | Temp 97.6°F | Resp 16 | Ht 72.0 in | Wt 272.0 lb

## 2019-11-30 DIAGNOSIS — K219 Gastro-esophageal reflux disease without esophagitis: Secondary | ICD-10-CM

## 2019-11-30 DIAGNOSIS — F411 Generalized anxiety disorder: Secondary | ICD-10-CM

## 2019-11-30 DIAGNOSIS — Z79899 Other long term (current) drug therapy: Secondary | ICD-10-CM

## 2019-11-30 DIAGNOSIS — R2233 Localized swelling, mass and lump, upper limb, bilateral: Secondary | ICD-10-CM

## 2019-11-30 DIAGNOSIS — R252 Cramp and spasm: Secondary | ICD-10-CM | POA: Diagnosis not present

## 2019-11-30 DIAGNOSIS — M1990 Unspecified osteoarthritis, unspecified site: Secondary | ICD-10-CM

## 2019-11-30 DIAGNOSIS — R300 Dysuria: Secondary | ICD-10-CM

## 2019-11-30 DIAGNOSIS — F17208 Nicotine dependence, unspecified, with other nicotine-induced disorders: Secondary | ICD-10-CM

## 2019-11-30 LAB — POCT URINE DRUG SCREEN
POC Amphetamine UR: NOT DETECTED
POC BENZODIAZEPINES UR: NOT DETECTED
POC Barbiturate UR: NOT DETECTED
POC Cocaine UR: NOT DETECTED
POC Ecstasy UR: NOT DETECTED
POC Marijuana UR: POSITIVE — AB
POC Methadone UR: NOT DETECTED
POC Methamphetamine UR: NOT DETECTED
POC Opiate Ur: NOT DETECTED
POC Oxycodone UR: NOT DETECTED
POC PHENCYCLIDINE UR: NOT DETECTED
POC TRICYCLICS UR: POSITIVE — AB

## 2019-11-30 MED ORDER — NICOTINE 21 MG/24HR TD PT24: 21.0000 mg | MEDICATED_PATCH | Freq: Every day | TRANSDERMAL | 0 refills | Status: DC

## 2019-11-30 MED ORDER — TRAMADOL HCL 50 MG PO TABS: 50.0000 mg | ORAL_TABLET | Freq: Four times a day (QID) | ORAL | 2 refills | Status: DC | PRN

## 2019-11-30 MED ORDER — CYCLOBENZAPRINE HCL 10 MG PO TABS: 10.0000 mg | ORAL_TABLET | Freq: Three times a day (TID) | ORAL | 2 refills | Status: DC | PRN

## 2019-11-30 MED ORDER — ALPRAZOLAM 0.5 MG PO TABS: 0.5000 mg | ORAL_TABLET | Freq: Two times a day (BID) | ORAL | 2 refills | Status: DC | PRN

## 2019-11-30 MED ORDER — OMEPRAZOLE 40 MG PO CPDR: 40.0000 mg | DELAYED_RELEASE_CAPSULE | Freq: Every day | ORAL | 5 refills | Status: DC

## 2019-11-30 NOTE — Progress Notes (Signed)
[facility] [place], [place] [address]  Internal MEDICINE  Office Visit Note  Patient Name: Zachary Holmes  [id number]  [id number]  Date of Service: 11/30/2019  Chief Complaint  Patient presents with  . Follow-up  . Depression  . Gastroesophageal Reflux  . Hypertension    HPI  Pt is here for follow up on depression, GERD, and HTN. Overall he is doing well.  His blood pressure was initially elevated.  Better on recheck.  His depression ,and GERD are well controlled.   He denies any new or worsening symptoms.  He is requesting med refills at this time. He also reports nodules that are tender on the joints of his fingers.  They have been there some time, but they are becoming tender and he is concerned after doing research that he has RA.     Current Medication: Outpatient Encounter Medications as of 11/30/2019  Medication Sig  . ALPRAZolam (XANAX) 0.5 MG tablet Take 1 tablet (0.5 mg total) by mouth 2 (two) times daily as needed for anxiety.  . cyclobenzaprine (FLEXERIL) 10 MG tablet Take 1 tablet (10 mg total) by mouth 3 (three) times daily as needed for muscle spasms.  . nicotine (NICODERM CQ - DOSED IN MG/24 HOURS) 21 mg/24hr patch Place 1 patch (21 mg total) onto the skin daily.  Marland Kitchen omeprazole (PRILOSEC) 40 MG capsule Take 1 capsule (40 mg total) by mouth daily.  . traMADol (ULTRAM) 50 MG tablet Take 1 tablet (50 mg total) by mouth every 6 (six) hours as needed.  . [DISCONTINUED] ALPRAZolam (XANAX) 0.5 MG tablet Take 1 tablet (0.5 mg total) by mouth 2 (two) times daily as needed for anxiety.  . [DISCONTINUED] cyclobenzaprine (FLEXERIL) 10 MG tablet Take 1 tablet (10 mg total) by mouth 3 (three) times daily as needed for muscle spasms.  . [DISCONTINUED] nicotine (NICODERM CQ - DOSED IN MG/24 HOURS) 21 mg/24hr patch Place 1 patch (21 mg total) onto the skin daily.  . [DISCONTINUED] omeprazole (PRILOSEC) 40 MG capsule Take 1 capsule (40 mg total) by mouth  daily.  . [DISCONTINUED] traMADol (ULTRAM) 50 MG tablet Take 1 tablet (50 mg total) by mouth every 6 (six) hours as needed.   No facility-administered encounter medications on file as of 11/30/2019.    Surgical History: Past Surgical History:  Procedure Laterality Date  . ANKLE FUSION Right 11/24/2007   Archie Endo 12/04/2010  . COLONOSCOPY WITH PROPOFOL N/A 05/13/2017   Procedure: COLONOSCOPY WITH PROPOFOL;  Surgeon: Lucilla Lame, MD;  Location: [facility];  Service: Endoscopy;  Laterality: N/A;  requests early  . FRACTURE SURGERY    . JOINT REPLACEMENT    . KNEE ARTHROCENTESIS Left X 5   "got infected . . ."  . MULTIPLE TOOTH EXTRACTIONS    . SHOULDER ARTHROSCOPY Left 06/2007   Archie Endo 12/03/2010  . TOTAL KNEE ARTHROPLASTY Left 2008   Archie Endo 04/09/2010  . TOTAL SHOULDER ARTHROPLASTY Left 09/20/2014  . TOTAL SHOULDER ARTHROPLASTY Left 09/20/2014   Procedure: TOTAL SHOULDER ARTHROPLASTY;  Surgeon: Nita Sells, MD;  Location: [facility];  Service: Orthopedics;  Laterality: Left;  Left total shoulder arthroplasty  . TOTAL SHOULDER ARTHROPLASTY Right 06/02/2018   Procedure: RIGHT TOTAL SHOULDER ARTHROPLASTY;  Surgeon: Tania Ade, MD;  Location: [facility];  Service: Orthopedics;  Laterality: Right;    Medical History: Past Medical History:  Diagnosis Date  . Anxiety   . Arthritis    "legs" (09/20/2014)  . Depression   . GERD (  gastroesophageal reflux disease)   . Hypertension   . OA (osteoarthritis) of shoulder    right  . Seizures ([place]) 1983   x1, S/P head injury  . Serratia marcescens infection 2011   left knee  . Umbilical hernia   . Wears dentures    partial upper    Family History: Family History  Problem Relation Age of Onset  . COPD Mother   . Heart disease Father     Social History   Socioeconomic History  . Marital status: Single    Spouse name: Not on file  . Number of children: Not on file  . Years of education: Not on file  . Highest education  level: Not on file  Occupational History  . Not on file  Tobacco Use  . Smoking status: Current Every Day Smoker    Packs/day: 0.50    Years: 20.00    Pack years: 10.00    Types: Cigarettes  . Smokeless tobacco: Former Systems developer    Types: Chew  . Tobacco comment: pcp PCP had given pt patch  Substance and Sexual Activity  . Alcohol use: Not Currently    Alcohol/week: 12.0 standard drinks    Types: 12 Cans of beer per week    Comment: SOBER SINCE 05/17/2017  . Drug use: Yes    Types: Marijuana    Comment: DAILY  . Sexual activity: Not Currently  Other Topics Concern  . Not on file  Social History Narrative  . Not on file   Social Determinants of Health   Financial Resource Strain:   . Difficulty of Paying Living Expenses:   Food Insecurity:   . Worried About Charity fundraiser in the Last Year:   . Arboriculturist in the Last Year:   Transportation Needs:   . Film/video editor (Medical):   Marland Kitchen Lack of Transportation (Non-Medical):   Physical Activity:   . Days of Exercise per Week:   . Minutes of Exercise per Session:   Stress:   . Feeling of Stress :   Social Connections:   . Frequency of Communication with Friends and Family:   . Frequency of Social Gatherings with Friends and Family:   . Attends Religious Services:   . Active Member of Clubs or Organizations:   . Attends Archivist Meetings:   Marland Kitchen Marital Status:   Intimate Partner Violence:   . Fear of Current or Ex-Partner:   . Emotionally Abused:   Marland Kitchen Physically Abused:   . Sexually Abused:       Review of Systems  Constitutional: Negative.  Negative for chills, fatigue and unexpected weight change.  HENT: Negative.  Negative for congestion, rhinorrhea, sneezing and sore throat.   Eyes: Negative for redness.  Respiratory: Negative.  Negative for cough, chest tightness and shortness of breath.   Cardiovascular: Negative.  Negative for chest pain and palpitations.  Gastrointestinal: Negative.   Negative for abdominal pain, constipation, diarrhea, nausea and vomiting.  Endocrine: Negative.   Genitourinary: Negative.  Negative for dysuria and frequency.  Musculoskeletal: Negative.  Negative for arthralgias, back pain, joint swelling and neck pain.  Skin: Negative.  Negative for rash.  Allergic/Immunologic: Negative.   Neurological: Negative.  Negative for tremors and numbness.  Hematological: Negative for adenopathy. Does not bruise/bleed easily.  Psychiatric/Behavioral: Negative.  Negative for behavioral problems, sleep disturbance and suicidal ideas. The patient is not nervous/anxious.     Vital Signs: BP 130/82   Pulse 82  Temp 97.6 F (36.4 C)   Resp 16   Ht 6' (1.829 m)   Wt 272 lb (123.4 kg)   SpO2 94%   BMI 36.89 kg/m    Physical Exam Vitals and nursing note reviewed.  Constitutional:      General: He is not in acute distress.    Appearance: He is well-developed. He is not diaphoretic.  HENT:     Head: Normocephalic and atraumatic.     Mouth/Throat:     Pharynx: No oropharyngeal exudate.  Eyes:     Pupils: Pupils are equal, round, and reactive to light.  Neck:     Thyroid: No thyromegaly.     Vascular: No JVD.     Trachea: No tracheal deviation.  Cardiovascular:     Rate and Rhythm: Normal rate and regular rhythm.     Heart sounds: Normal heart sounds. No murmur. No friction rub. No gallop.   Pulmonary:     Effort: Pulmonary effort is normal. No respiratory distress.     Breath sounds: Normal breath sounds. No wheezing or rales.  Chest:     Chest wall: No tenderness.  Abdominal:     Palpations: Abdomen is soft.     Tenderness: There is no abdominal tenderness. There is no guarding.  Musculoskeletal:        General: Normal range of motion.     Cervical back: Normal range of motion and neck supple.  Lymphadenopathy:     Cervical: No cervical adenopathy.  Skin:    General: Skin is warm and dry.  Neurological:     Mental Status: He is alert and  oriented to person, place, and time.     Cranial Nerves: No cranial nerve deficit.  Psychiatric:        Behavior: Behavior normal.        Thought Content: Thought content normal.        Judgment: Judgment normal.    Assessment/Plan: 1. Chronic osteoarthritis Continue to use Tramadol for chronic pain. Reviewed risks and possible side effects associated with taking opiates, benzodiazepines and other CNS depressants. Combination of these could cause dizziness and drowsiness. Advised patient not to drive or operate machinery when taking these medications, as patient's and other's life can be at risk and will have consequences. Patient verbalized understanding in this matter. Dependence and abuse for these drugs will be monitored closely. A Controlled substance policy and procedure is on file which allows [facility] to order a urine drug screen test at any visit. Patient understands and agrees with the plan - traMADol (ULTRAM) 50 MG tablet; Take 1 tablet (50 mg total) by mouth every 6 (six) hours as needed.  Dispense: 30 tablet; Refill: 2  2. Gastroesophageal reflux disease without esophagitis Continue to use Prilosec as directed.  - omeprazole (PRILOSEC) 40 MG capsule; Take 1 capsule (40 mg total) by mouth daily.  Dispense: 30 capsule; Refill: 5  3. GAD (generalized anxiety disorder) Reviewed risks and possible side effects associated with taking opiates, benzodiazepines and other CNS depressants. Combination of these could cause dizziness and drowsiness. Advised patient not to drive or operate machinery when taking these medications, as patient's and other's life can be at risk and will have consequences. Patient verbalized understanding in this matter. Dependence and abuse for these drugs will be monitored closely. A Controlled substance policy and procedure is on file which allows [facility] to order a urine drug screen test at any visit. Patient understands and agrees  with the plan - ALPRAZolam (XANAX) 0.5 MG tablet; Take 1 tablet (0.5 mg total) by mouth 2 (two) times daily as needed for anxiety.  Dispense: 60 tablet; Refill: 2  4. Cramp and spasm Continue with flexeril use as prescribed.  - cyclobenzaprine (FLEXERIL) 10 MG tablet; Take 1 tablet (10 mg total) by mouth 3 (three) times daily as needed for muscle spasms.  Dispense: 90 tablet; Refill: 2  5. Nodule of finger of both hands - Rheumatoid factor - ANA - Sed Rate (ESR)  6. Nicotine dependence with other nicotine-induced disorder, unspecified nicotine product type Smoking cessation counseling: 1. Pt acknowledges the risks of long term smoking, she will try to quite smoking. 2. Options for different medications including nicotine products, chewing gum, patch etc, Wellbutrin and Chantix is discussed 3. Goal and date of compete cessation is discussed 4. Total time spent in smoking cessation is 15 min.  - nicotine (NICODERM CQ - DOSED IN MG/24 HOURS) 21 mg/24hr patch; Place 1 patch (21 mg total) onto the skin daily.  Dispense: 28 patch; Refill: 0  7. Encounter for long-term (current) use of medications - POCT Urine Drug Screen  General Counseling: Messiyah verbalizes understanding of the findings of todays visit and agrees with plan of treatment. I have discussed any further diagnostic evaluation that may be needed or ordered today. We also reviewed his medications today. he has been encouraged to call the office with any questions or concerns that should arise related to todays visit.    Orders Placed This Encounter  Procedures  . Rheumatoid factor  . ANA  . Sed Rate (ESR)  . POCT Urine Drug Screen    Meds ordered this encounter  Medications  . traMADol (ULTRAM) 50 MG tablet    Sig: Take 1 tablet (50 mg total) by mouth every 6 (six) hours as needed.    Dispense:  30 tablet    Refill:  2  . omeprazole (PRILOSEC) 40 MG capsule    Sig: Take 1 capsule (40 mg total) by mouth daily.     Dispense:  30 capsule    Refill:  5  . ALPRAZolam (XANAX) 0.5 MG tablet    Sig: Take 1 tablet (0.5 mg total) by mouth 2 (two) times daily as needed for anxiety.    Dispense:  60 tablet    Refill:  2  . cyclobenzaprine (FLEXERIL) 10 MG tablet    Sig: Take 1 tablet (10 mg total) by mouth 3 (three) times daily as needed for muscle spasms.    Dispense:  90 tablet    Refill:  2  . nicotine (NICODERM CQ - DOSED IN MG/24 HOURS) 21 mg/24hr patch    Sig: Place 1 patch (21 mg total) onto the skin daily.    Dispense:  28 patch    Refill:  0    Time spent: 30 Minutes   This patient was seen by Orson Gear AGNP-C in Collaboration with Dr Lavera Guise as a part of collaborative care agreement     Kendell Bane AGNP-C Internal medicine

## 2020-01-03 ENCOUNTER — Other Ambulatory Visit: Payer: Self-pay | Admitting: Adult Health

## 2020-01-03 DIAGNOSIS — K219 Gastro-esophageal reflux disease without esophagitis: Secondary | ICD-10-CM

## 2020-01-03 DIAGNOSIS — M1990 Unspecified osteoarthritis, unspecified site: Secondary | ICD-10-CM

## 2020-01-04 NOTE — Telephone Encounter (Signed)
Pt needs refill. Last appt 11/30/19 and next appt 02/29/20

## 2020-02-27 ENCOUNTER — Telehealth: Payer: Self-pay

## 2020-02-27 NOTE — Telephone Encounter (Signed)
Called lmom informing patient of appointment on 02/29/2020. klh

## 2020-02-28 ENCOUNTER — Telehealth: Payer: Self-pay

## 2020-02-28 NOTE — Telephone Encounter (Signed)
Confirmed appointment on 02/29/2020 and screened for covid. [facility] 

## 2020-02-29 ENCOUNTER — Ambulatory Visit (INDEPENDENT_AMBULATORY_CARE_PROVIDER_SITE_OTHER): Payer: Medicare HMO | Admitting: Adult Health

## 2020-02-29 ENCOUNTER — Other Ambulatory Visit: Payer: Self-pay

## 2020-02-29 ENCOUNTER — Encounter: Payer: Self-pay | Admitting: Adult Health

## 2020-02-29 VITALS — BP 120/88 | HR 83 | Temp 97.8°F | Resp 16 | Ht 73.0 in | Wt 269.8 lb

## 2020-02-29 DIAGNOSIS — F17208 Nicotine dependence, unspecified, with other nicotine-induced disorders: Secondary | ICD-10-CM

## 2020-02-29 DIAGNOSIS — M1990 Unspecified osteoarthritis, unspecified site: Secondary | ICD-10-CM

## 2020-02-29 DIAGNOSIS — F411 Generalized anxiety disorder: Secondary | ICD-10-CM

## 2020-02-29 DIAGNOSIS — K219 Gastro-esophageal reflux disease without esophagitis: Secondary | ICD-10-CM

## 2020-02-29 DIAGNOSIS — I10 Essential (primary) hypertension: Secondary | ICD-10-CM

## 2020-02-29 DIAGNOSIS — E1165 Type 2 diabetes mellitus with hyperglycemia: Secondary | ICD-10-CM

## 2020-02-29 LAB — POCT GLYCOSYLATED HEMOGLOBIN (HGB A1C): Hemoglobin A1C: 5.8 % — AB (ref 4.0–5.6)

## 2020-02-29 MED ORDER — ALPRAZOLAM 0.5 MG PO TABS: 0.5000 mg | ORAL_TABLET | Freq: Two times a day (BID) | ORAL | 2 refills | Status: DC | PRN

## 2020-02-29 MED ORDER — TRAMADOL HCL 50 MG PO TABS: ORAL_TABLET | ORAL | 0 refills | Status: DC

## 2020-02-29 NOTE — Progress Notes (Signed)
[facility] [place], [place] [address]  Internal MEDICINE  Office Visit Note  Patient Name: Zachary Holmes  [id number]  [id number]  Date of Service: 02/29/2020  Chief Complaint  Patient presents with  . Follow-up  . Hypertension  . Gastroesophageal Reflux  . Quality Metric Gaps    TDAP    HPI  Pt is here for follow up on HTN, Pre-DM, and GERD.  Overall he remains at baseline. Denies any new or worsening issues.  His blood pressure is 120/88 today.  Denies Chest pain, Shortness of breath, palpitations, headache, or blurred vision. His blood sugars have been stable, and his a1c is unchanged.       Current Medication: Outpatient Encounter Medications as of 02/29/2020  Medication Sig  . ALPRAZolam (XANAX) 0.5 MG tablet Take 1 tablet (0.5 mg total) by mouth 2 (two) times daily as needed for anxiety.  . cyclobenzaprine (FLEXERIL) 10 MG tablet Take 1 tablet (10 mg total) by mouth 3 (three) times daily as needed for muscle spasms.  . nicotine (NICODERM CQ - DOSED IN MG/24 HOURS) 21 mg/24hr patch Place 1 patch (21 mg total) onto the skin daily.  Marland Kitchen omeprazole (PRILOSEC) 40 MG capsule TAKE 1 CAPSULE EVERY DAY  . traMADol (ULTRAM) 50 MG tablet TAKE 1 TABLET(50 MG) BY MOUTH EVERY 6 HOURS AS NEEDED   No facility-administered encounter medications on file as of 02/29/2020.    Surgical History: Past Surgical History:  Procedure Laterality Date  . ANKLE FUSION Right 11/24/2007   Archie Endo 12/04/2010  . COLONOSCOPY WITH PROPOFOL N/A 05/13/2017   Procedure: COLONOSCOPY WITH PROPOFOL;  Surgeon: Lucilla Lame, MD;  Location: [facility];  Service: Endoscopy;  Laterality: N/A;  requests early  . FRACTURE SURGERY    . JOINT REPLACEMENT    . KNEE ARTHROCENTESIS Left X 5   "got infected . . ."  . MULTIPLE TOOTH EXTRACTIONS    . SHOULDER ARTHROSCOPY Left 06/2007   Archie Endo 12/03/2010  . TOTAL KNEE ARTHROPLASTY Left 2008   Archie Endo 04/09/2010  . TOTAL SHOULDER ARTHROPLASTY  Left 09/20/2014  . TOTAL SHOULDER ARTHROPLASTY Left 09/20/2014   Procedure: TOTAL SHOULDER ARTHROPLASTY;  Surgeon: Nita Sells, MD;  Location: [facility];  Service: Orthopedics;  Laterality: Left;  Left total shoulder arthroplasty  . TOTAL SHOULDER ARTHROPLASTY Right 06/02/2018   Procedure: RIGHT TOTAL SHOULDER ARTHROPLASTY;  Surgeon: Tania Ade, MD;  Location: [facility];  Service: Orthopedics;  Laterality: Right;    Medical History: Past Medical History:  Diagnosis Date  . Anxiety   . Arthritis    "legs" (09/20/2014)  . Depression   . GERD (gastroesophageal reflux disease)   . Hypertension   . OA (osteoarthritis) of shoulder    right  . Seizures ([place]) 1983   x1, S/P head injury  . Serratia marcescens infection 2011   left knee  . Umbilical hernia   . Wears dentures    partial upper    Family History: Family History  Problem Relation Age of Onset  . COPD Mother   . Heart disease Father     Social History   Socioeconomic History  . Marital status: Single    Spouse name: Not on file  . Number of children: Not on file  . Years of education: Not on file  . Highest education level: Not on file  Occupational History  . Not on file  Tobacco Use  . Smoking status: Current Every Day Smoker    Packs/day: 0.50  Years: 20.00    Pack years: 10.00    Types: Cigarettes  . Smokeless tobacco: Former Systems developer    Types: Chew  . Tobacco comment: pcp PCP had given pt patch  Vaping Use  . Vaping Use: Never used  Substance and Sexual Activity  . Alcohol use: Not Currently    Alcohol/week: 12.0 standard drinks    Types: 12 Cans of beer per week    Comment: SOBER SINCE 05/17/2017  . Drug use: Yes    Types: Marijuana    Comment: DAILY  . Sexual activity: Not Currently  Other Topics Concern  . Not on file  Social History Narrative  . Not on file   Social Determinants of Health   Financial Resource Strain:   . Difficulty of Paying Living Expenses:   Food Insecurity:    . Worried About Charity fundraiser in the Last Year:   . Arboriculturist in the Last Year:   Transportation Needs:   . Film/video editor (Medical):   Marland Kitchen Lack of Transportation (Non-Medical):   Physical Activity:   . Days of Exercise per Week:   . Minutes of Exercise per Session:   Stress:   . Feeling of Stress :   Social Connections:   . Frequency of Communication with Friends and Family:   . Frequency of Social Gatherings with Friends and Family:   . Attends Religious Services:   . Active Member of Clubs or Organizations:   . Attends Archivist Meetings:   Marland Kitchen Marital Status:   Intimate Partner Violence:   . Fear of Current or Ex-Partner:   . Emotionally Abused:   Marland Kitchen Physically Abused:   . Sexually Abused:       Review of Systems  Constitutional: Negative.  Negative for chills, fatigue and unexpected weight change.  HENT: Negative.  Negative for congestion, rhinorrhea, sneezing and sore throat.   Eyes: Negative for redness.  Respiratory: Negative.  Negative for cough, chest tightness and shortness of breath.   Cardiovascular: Negative.  Negative for chest pain and palpitations.  Gastrointestinal: Negative.  Negative for abdominal pain, constipation, diarrhea, nausea and vomiting.  Endocrine: Negative.   Genitourinary: Negative.  Negative for dysuria and frequency.  Musculoskeletal: Negative.  Negative for arthralgias, back pain, joint swelling and neck pain.  Skin: Negative.  Negative for rash.  Allergic/Immunologic: Negative.   Neurological: Negative.  Negative for tremors and numbness.  Hematological: Negative for adenopathy. Does not bruise/bleed easily.  Psychiatric/Behavioral: Negative.  Negative for behavioral problems, sleep disturbance and suicidal ideas. The patient is not nervous/anxious.     Vital Signs: BP (!) 156/85   Pulse 83   Temp 97.8 F (36.6 C)   Resp 16   Ht 6\' 1"  (1.854 m)   Wt (!) 269 lb 12.8 oz (122.4 kg)   SpO2 95%   BMI 35.60  kg/m    Physical Exam Vitals and nursing note reviewed.  Constitutional:      General: He is not in acute distress.    Appearance: He is well-developed. He is not diaphoretic.  HENT:     Head: Normocephalic and atraumatic.     Mouth/Throat:     Pharynx: No oropharyngeal exudate.  Eyes:     Pupils: Pupils are equal, round, and reactive to light.  Neck:     Thyroid: No thyromegaly.     Vascular: No JVD.     Trachea: No tracheal deviation.  Cardiovascular:     Rate and  Rhythm: Normal rate and regular rhythm.     Heart sounds: Normal heart sounds. No murmur heard.  No friction rub. No gallop.   Pulmonary:     Effort: Pulmonary effort is normal. No respiratory distress.     Breath sounds: Normal breath sounds. No wheezing or rales.  Chest:     Chest wall: No tenderness.  Abdominal:     Palpations: Abdomen is soft.     Tenderness: There is no abdominal tenderness. There is no guarding.  Musculoskeletal:        General: Normal range of motion.     Cervical back: Normal range of motion and neck supple.  Lymphadenopathy:     Cervical: No cervical adenopathy.  Skin:    General: Skin is warm and dry.  Neurological:     Mental Status: He is alert and oriented to person, place, and time.     Cranial Nerves: No cranial nerve deficit.  Psychiatric:        Behavior: Behavior normal.        Thought Content: Thought content normal.        Judgment: Judgment normal.    Assessment/Plan: 1. Type 2 diabetes mellitus with hyperglycemia, without long-term current use of insulin (HCC) A1C remains 5.8, pt is doing well.  Continue current mgmt.  - POCT HgB A1C  2. Gastroesophageal reflux disease without esophagitis No recent issues.  Continue to follow.   3. GAD (generalized anxiety disorder) Reviewed risks and possible side effects associated with taking opiates, benzodiazepines and other CNS depressants. Combination of these could cause dizziness and drowsiness. Advised patient not to  drive or operate machinery when taking these medications, as patient's and other's life can be at risk and will have consequences. Patient verbalized understanding in this matter. Dependence and abuse for these drugs will be monitored closely. A Controlled substance policy and procedure is on file which allows [facility] to order a urine drug screen test at any visit. Patient understands and agrees with the plan - ALPRAZolam (XANAX) 0.5 MG tablet; Take 1 tablet (0.5 mg total) by mouth 2 (two) times daily as needed for anxiety.  Dispense: 60 tablet; Refill: 2  4. Nicotine dependence with other nicotine-induced disorder, unspecified nicotine product type Smoking cessation counseling: 1. Pt acknowledges the risks of long term smoking, she will try to quite smoking. 2. Options for different medications including nicotine products, chewing gum, patch etc, Wellbutrin and Chantix is discussed 3. Goal and date of compete cessation is discussed 4. Total time spent in smoking cessation is 15 min.   5. Essential hypertension Continue to monitor bp.   6. Chronic osteoarthritis Reviewed risks and possible side effects associated with taking opiates, benzodiazepines and other CNS depressants. Combination of these could cause dizziness and drowsiness. Advised patient not to drive or operate machinery when taking these medications, as patient's and other's life can be at risk and will have consequences. Patient verbalized understanding in this matter. Dependence and abuse for these drugs will be monitored closely. A Controlled substance policy and procedure is on file which allows [facility] to order a urine drug screen test at any visit. Patient understands and agrees with the plan - traMADol (ULTRAM) 50 MG tablet; TAKE 1 TABLET(50 MG) BY MOUTH EVERY 6 HOURS AS NEEDED  Dispense: 30 tablet; Refill: 0  General Counseling: darby shadwick understanding of the findings of todays visit and  agrees with plan of treatment. I have discussed any further diagnostic evaluation that  may be needed or ordered today. We also reviewed his medications today. he has been encouraged to call the office with any questions or concerns that should arise related to todays visit.    Orders Placed This Encounter  Procedures  . POCT HgB A1C    No orders of the defined types were placed in this encounter.   Time spent: 30 Minutes   This patient was seen by Orson Gear AGNP-C in Collaboration with Dr Lavera Guise as a part of collaborative care agreement     Kendell Bane AGNP-C Internal medicine

## 2020-03-07 ENCOUNTER — Other Ambulatory Visit: Payer: Self-pay | Admitting: Adult Health

## 2020-03-07 DIAGNOSIS — R252 Cramp and spasm: Secondary | ICD-10-CM

## 2020-03-11 ENCOUNTER — Other Ambulatory Visit: Payer: Self-pay

## 2020-03-11 DIAGNOSIS — R252 Cramp and spasm: Secondary | ICD-10-CM

## 2020-03-11 MED ORDER — CYCLOBENZAPRINE HCL 10 MG PO TABS: ORAL_TABLET | ORAL | 2 refills | Status: DC

## 2020-04-12 ENCOUNTER — Other Ambulatory Visit: Payer: Self-pay | Admitting: Adult Health

## 2020-04-12 DIAGNOSIS — M1990 Unspecified osteoarthritis, unspecified site: Secondary | ICD-10-CM

## 2020-04-12 NOTE — Telephone Encounter (Signed)
Needs refill on tramadol LOV: 02/29/20 LRF: 02/29/20 NOV: 06/03/20

## 2020-05-01 ENCOUNTER — Other Ambulatory Visit: Payer: Self-pay

## 2020-05-01 DIAGNOSIS — R252 Cramp and spasm: Secondary | ICD-10-CM

## 2020-05-01 MED ORDER — CYCLOBENZAPRINE HCL 10 MG PO TABS: ORAL_TABLET | ORAL | 2 refills | Status: DC

## 2020-05-02 ENCOUNTER — Other Ambulatory Visit: Payer: Self-pay

## 2020-05-02 DIAGNOSIS — R252 Cramp and spasm: Secondary | ICD-10-CM

## 2020-05-02 MED ORDER — CYCLOBENZAPRINE HCL 10 MG PO TABS: ORAL_TABLET | ORAL | 2 refills | Status: DC

## 2020-06-02 IMAGING — RF DG ESOPHAGUS
7 of 8 series · 14 of 20 positions shown · non-contrast
Comparison: None.

CLINICAL DATA: Dysphagia

EXAM:
ESOPHOGRAM / BARIUM SWALLOW / BARIUM TABLET STUDY
TECHNIQUE: Combined double contrast and single contrast examination performed
using effervescent crystals, thick barium liquid, and thin barium
liquid. The patient was observed with fluoroscopy swallowing a 13 mm
barium sulphate tablet.
FLUOROSCOPY TIME:  Fluoroscopy Time:  30 seconds
Radiation Exposure Index (if provided by the fluoroscopic device):
5.5 mGy
Number of Acquired Spot Images: 0

[Series 1: cp_standard · 0.25mm/px · 3 of 15 frames shown (1 of 7)]
[frame 3/15]
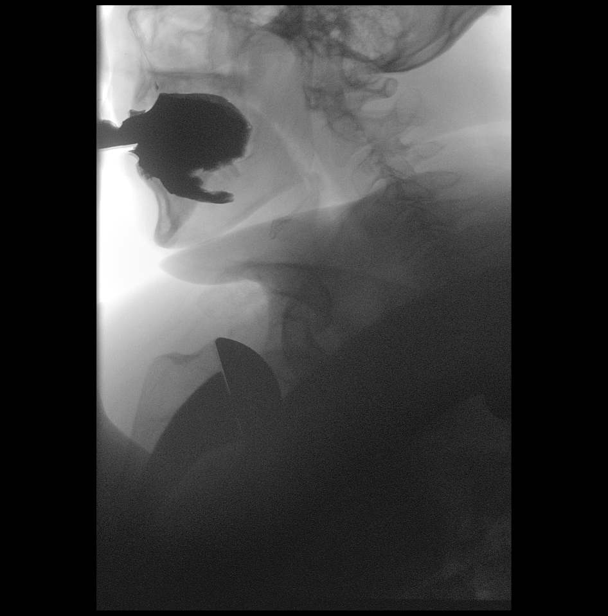
[frame 13/15]
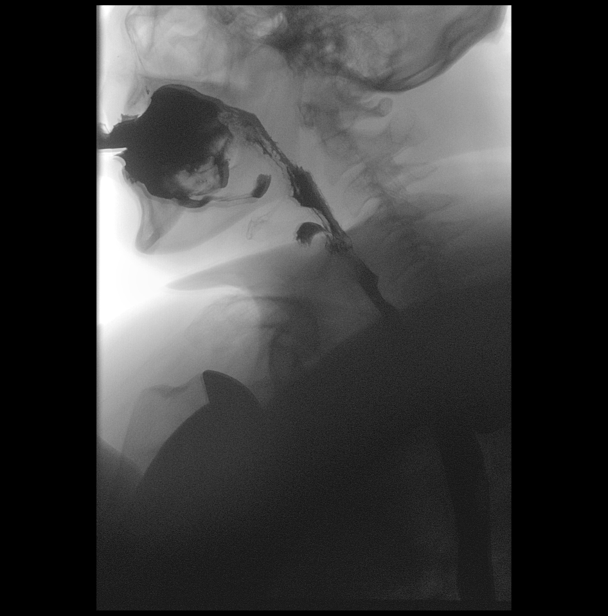
[frame 14/15]
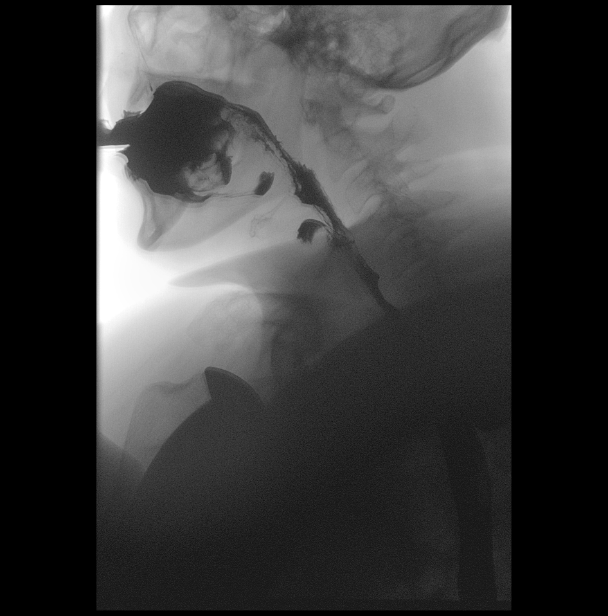

[Series 2: cp_standard · 0.25mm/px · 3 of 31 frames shown (2 of 7)]
[frame 5/31]
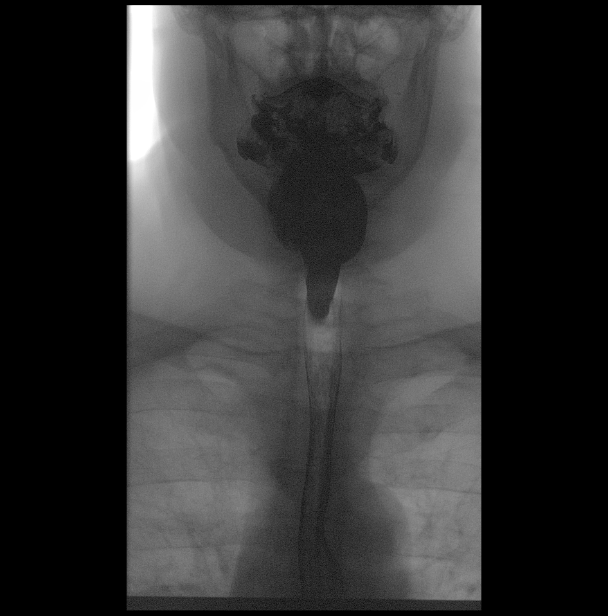
[frame 16/31]
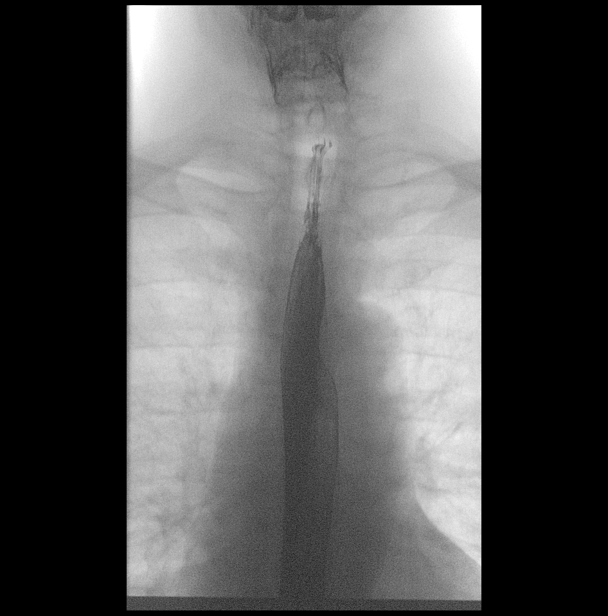
[frame 27/31]
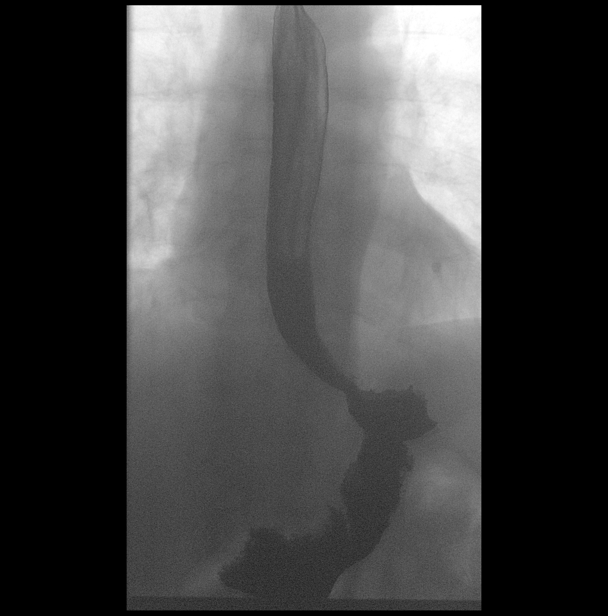

[Series 3: cp_standard · 0.25mm/px · 2 of 27 frames shown (3 of 7)]
[frame 5/27]
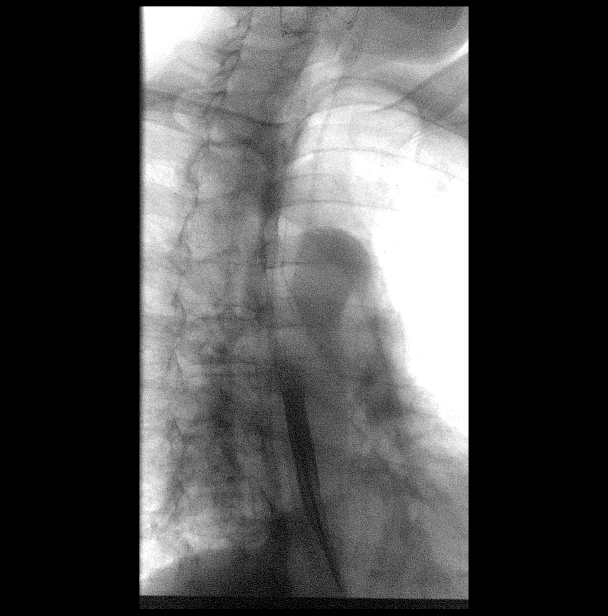
[frame 14/27]
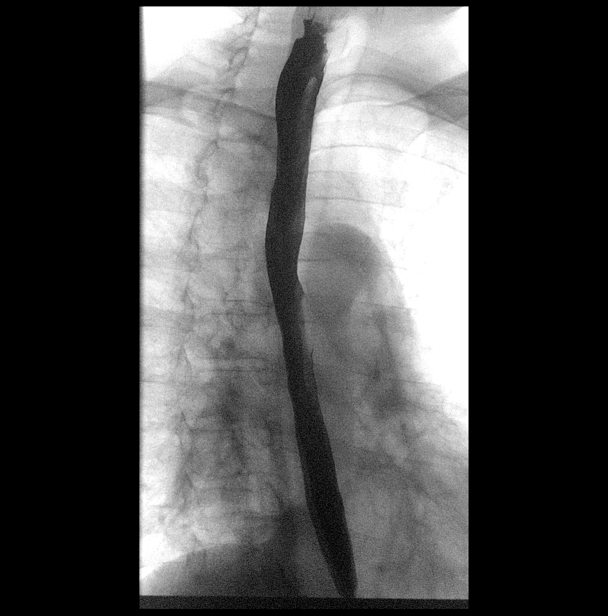

[Series 4: cp_standard · 0.25mm/px · 3 of 11 frames shown (4 of 7)]
[frame 2/11]
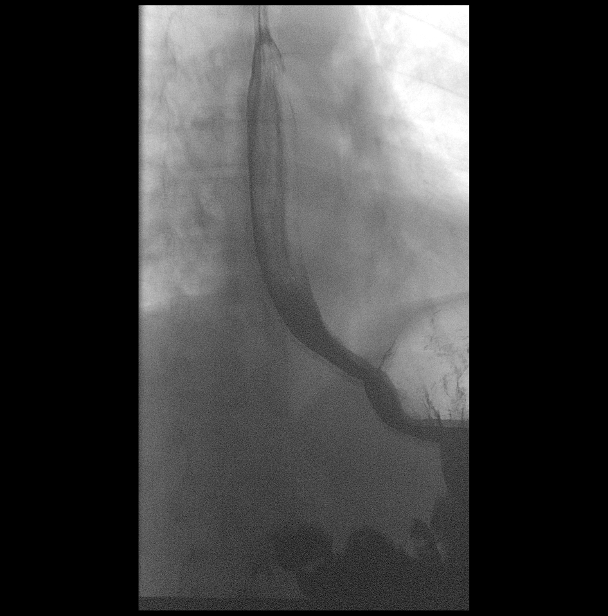
[frame 6/11]
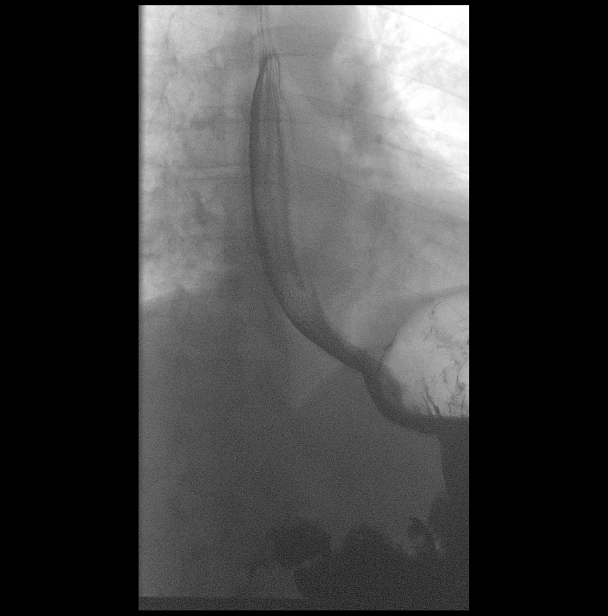
[frame 10/11]
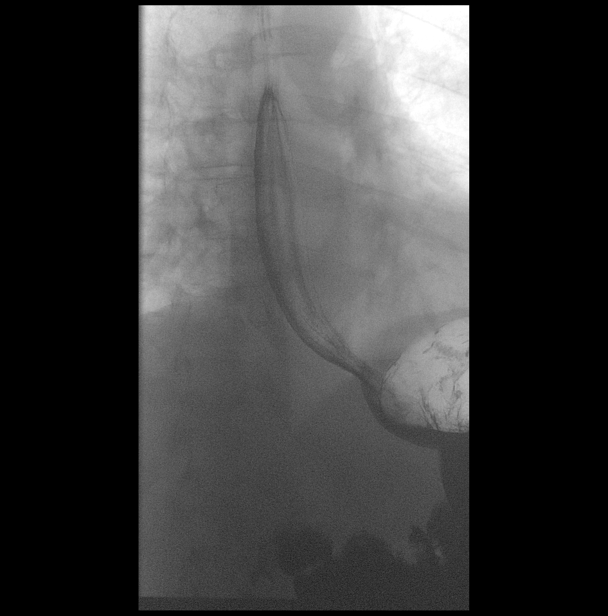

[Series 5: cp_standard · 0.28mm/px · 1 of 1 slices shown (5 of 7)]
[im 1/1]
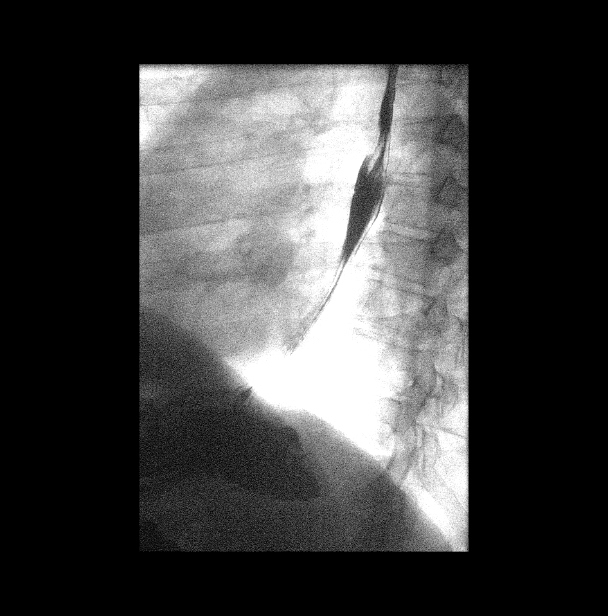

[Series 6: cp_standard · 0.29mm/px · 1 of 1 slices shown (6 of 7)]
[im 1/1]
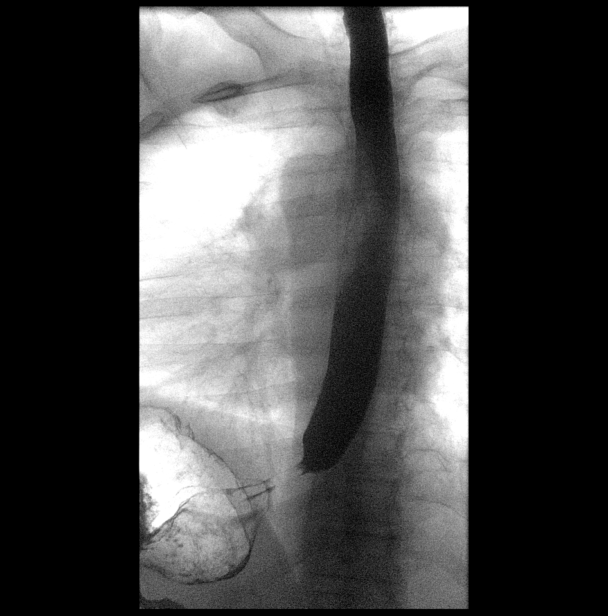

[Series 8: cp_standard · 0.29mm/px · 1 of 1 slices shown (7 of 7)]
[im 1/1]
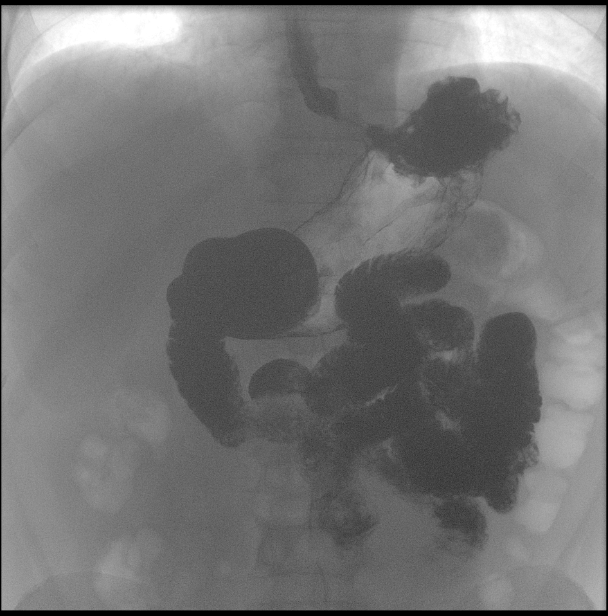

[14 of 20 positions shown; findings below may reference images not displayed]

FINDINGS: Normal pharyngeal anatomy and motility. Laryngeal penetration
without tracheal aspiration. Contrast flowed freely through the
esophagus without evidence of a stricture or mass. Normal esophageal
mucosa without evidence of irregularity or ulceration. Esophageal
motility was normal. Mild gastroesophageal reflux. No definite
hiatal hernia was demonstrated.

At the end of the examination a 13 mm barium tablet was administered
which transited through the esophagus and esophagogastric junction
without delay.
IMPRESSION: 1. Mild gastroesophageal reflux.
2. Otherwise unremarkable barium swallow.

## 2020-06-03 ENCOUNTER — Other Ambulatory Visit: Payer: Self-pay

## 2020-06-03 ENCOUNTER — Ambulatory Visit (INDEPENDENT_AMBULATORY_CARE_PROVIDER_SITE_OTHER): Payer: Medicare HMO | Admitting: Internal Medicine

## 2020-06-03 ENCOUNTER — Encounter: Payer: Self-pay | Admitting: Adult Health

## 2020-06-03 DIAGNOSIS — I10 Essential (primary) hypertension: Secondary | ICD-10-CM | POA: Diagnosis not present

## 2020-06-03 DIAGNOSIS — E782 Mixed hyperlipidemia: Secondary | ICD-10-CM | POA: Diagnosis not present

## 2020-06-03 DIAGNOSIS — G2581 Restless legs syndrome: Secondary | ICD-10-CM | POA: Diagnosis not present

## 2020-06-03 DIAGNOSIS — G8928 Other chronic postprocedural pain: Secondary | ICD-10-CM | POA: Diagnosis not present

## 2020-06-03 DIAGNOSIS — G4733 Obstructive sleep apnea (adult) (pediatric): Secondary | ICD-10-CM

## 2020-06-03 DIAGNOSIS — Z125 Encounter for screening for malignant neoplasm of prostate: Secondary | ICD-10-CM | POA: Diagnosis not present

## 2020-06-03 DIAGNOSIS — F411 Generalized anxiety disorder: Secondary | ICD-10-CM | POA: Diagnosis not present

## 2020-06-03 MED ORDER — BISOPROLOL-HYDROCHLOROTHIAZIDE 2.5-6.25 MG PO TABS: ORAL_TABLET | ORAL | 2 refills | Status: DC

## 2020-06-03 MED ORDER — ALPRAZOLAM 0.5 MG PO TABS: ORAL_TABLET | ORAL | 1 refills | Status: DC

## 2020-06-03 MED ORDER — BUPROPION HCL ER (XL) 150 MG PO TB24: ORAL_TABLET | ORAL | 2 refills | Status: DC

## 2020-06-03 MED ORDER — CYCLOBENZAPRINE HCL 10 MG PO TABS: ORAL_TABLET | ORAL | 2 refills | Status: DC

## 2020-06-03 MED ORDER — TRAMADOL HCL 50 MG PO TABS: ORAL_TABLET | ORAL | 0 refills | Status: DC

## 2020-06-03 NOTE — Progress Notes (Signed)
[facility] [place], [place] [address]  Internal MEDICINE  Office Visit Note  Patient Name: Zachary Holmes  [id number]  [id number]  Date of Service: 06/03/2020  Chief Complaint  Patient presents with  . Follow-up  . Depression  . Hypertension  . Anxiety  . policy update form    reviewed    HPI Pt is here for routine follow up, has h/o multiple surgeries due to accidental fall. Has chronic pain, mostly in knees, He does not tramadol, lately he is having muscle cramps during the day, restless legs at night, has h/o OSA but never got his CPAP machine, BP has been elevated, cholesterol is also elevated, pt continues to smoke as well Pt is willing to modify his therapy. Has GAD, Takes alprazolam prn  recent his GERD is better since he is taking Prilosec everyday   Current Medication: Outpatient Encounter Medications as of 06/03/2020  Medication Sig  . ALPRAZolam (XANAX) 0.5 MG tablet Take one tab a day prn for anxiety  . cyclobenzaprine (FLEXERIL) 10 MG tablet Take one tab po qhs for muscle spasm  . nicotine (NICODERM CQ - DOSED IN MG/24 HOURS) 21 mg/24hr patch Place 1 patch (21 mg total) onto the skin daily.  Marland Kitchen omeprazole (PRILOSEC) 40 MG capsule TAKE 1 CAPSULE EVERY DAY  . traMADol (ULTRAM) 50 MG tablet Take one tab po bid prn for pain  . [DISCONTINUED] ALPRAZolam (XANAX) 0.5 MG tablet Take 1 tablet (0.5 mg total) by mouth 2 (two) times daily as needed for anxiety.  . [DISCONTINUED] cyclobenzaprine (FLEXERIL) 10 MG tablet TAKE 1 TABLET(10 MG) BY MOUTH THREE TIMES DAILY AS NEEDED FOR MUSCLE SPASMS  . [DISCONTINUED] traMADol (ULTRAM) 50 MG tablet TAKE 1 TABLET(50 MG) BY MOUTH EVERY 6 HOURS AS NEEDED  . bisoprolol-hydrochlorothiazide (ZIAC) 2.5-6.25 MG tablet Take one tab po qam for BP  . buPROPion (WELLBUTRIN XL) 150 MG 24 hr tablet TAKE ONE TAB PO QD IN AM  FOR ANXIETY   No facility-administered encounter medications on file as of 06/03/2020.    Surgical  History: Past Surgical History:  Procedure Laterality Date  . ANKLE FUSION Right 11/24/2007   Archie Endo 12/04/2010  . COLONOSCOPY WITH PROPOFOL N/A 05/13/2017   Procedure: COLONOSCOPY WITH PROPOFOL;  Surgeon: Lucilla Lame, MD;  Location: [facility];  Service: Endoscopy;  Laterality: N/A;  requests early  . FRACTURE SURGERY    . JOINT REPLACEMENT    . KNEE ARTHROCENTESIS Left X 5   "got infected . . ."  . MULTIPLE TOOTH EXTRACTIONS    . SHOULDER ARTHROSCOPY Left 06/2007   Archie Endo 12/03/2010  . TOTAL KNEE ARTHROPLASTY Left 2008   Archie Endo 04/09/2010  . TOTAL SHOULDER ARTHROPLASTY Left 09/20/2014  . TOTAL SHOULDER ARTHROPLASTY Left 09/20/2014   Procedure: TOTAL SHOULDER ARTHROPLASTY;  Surgeon: Nita Sells, MD;  Location: [place];  Service: Orthopedics;  Laterality: Left;  Left total shoulder arthroplasty  . TOTAL SHOULDER ARTHROPLASTY Right 06/02/2018   Procedure: RIGHT TOTAL SHOULDER ARTHROPLASTY;  Surgeon: Tania Ade, MD;  Location: [facility];  Service: Orthopedics;  Laterality: Right;    Medical History: Past Medical History:  Diagnosis Date  . Anxiety   . Arthritis    "legs" (09/20/2014)  . Depression   . GERD (gastroesophageal reflux disease)   . Hypertension   . OA (osteoarthritis) of shoulder    right  . Seizures ([place]) 1983   x1, S/P head injury  . Serratia marcescens infection 2011   left knee  .  Umbilical hernia   . Wears dentures    partial upper    Family History: Family History  Problem Relation Age of Onset  . COPD Mother   . Heart disease Father     Social History   Socioeconomic History  . Marital status: Single    Spouse name: Not on file  . Number of children: Not on file  . Years of education: Not on file  . Highest education level: Not on file  Occupational History  . Not on file  Tobacco Use  . Smoking status: Current Every Day Smoker    Packs/day: 0.50    Years: 20.00    Pack years: 10.00    Types: Cigarettes  . Smokeless  tobacco: Former Systems developer    Types: Chew  . Tobacco comment: pcp PCP had given pt patch  Vaping Use  . Vaping Use: Never used  Substance and Sexual Activity  . Alcohol use: Not Currently    Alcohol/week: 12.0 standard drinks    Types: 12 Cans of beer per week    Comment: SOBER SINCE 05/17/2017  . Drug use: Yes    Types: Marijuana    Comment: DAILY  . Sexual activity: Not Currently  Other Topics Concern  . Not on file  Social History Narrative  . Not on file   Social Determinants of Health   Financial Resource Strain:   . Difficulty of Paying Living Expenses: Not on file  Food Insecurity:   . Worried About Charity fundraiser in the Last Year: Not on file  . Ran Out of Food in the Last Year: Not on file  Transportation Needs:   . Lack of Transportation (Medical): Not on file  . Lack of Transportation (Non-Medical): Not on file  Physical Activity:   . Days of Exercise per Week: Not on file  . Minutes of Exercise per Session: Not on file  Stress:   . Feeling of Stress : Not on file  Social Connections:   . Frequency of Communication with Friends and Family: Not on file  . Frequency of Social Gatherings with Friends and Family: Not on file  . Attends Religious Services: Not on file  . Active Member of Clubs or Organizations: Not on file  . Attends Archivist Meetings: Not on file  . Marital Status: Not on file  Intimate Partner Violence:   . Fear of Current or Ex-Partner: Not on file  . Emotionally Abused: Not on file  . Physically Abused: Not on file  . Sexually Abused: Not on file      Review of Systems  Constitutional: Negative for chills, fatigue and unexpected weight change.  HENT: Negative for congestion, postnasal drip, rhinorrhea, sneezing and sore throat.   Eyes: Negative for redness.  Respiratory: Negative for cough, chest tightness and shortness of breath.   Cardiovascular: Negative for chest pain and palpitations.  Gastrointestinal: Negative for  abdominal pain, constipation, diarrhea, nausea and vomiting.  Genitourinary: Negative for dysuria and frequency.  Musculoskeletal: Positive for arthralgias and joint swelling. Negative for back pain and neck pain.  Skin: Negative for rash.  Neurological: Negative.  Negative for tremors and numbness.  Hematological: Negative for adenopathy. Does not bruise/bleed easily.  Psychiatric/Behavioral: Positive for sleep disturbance. Negative for behavioral problems (Depression) and suicidal ideas. The patient is nervous/anxious.     Vital Signs: BP 124/88   Pulse 95   Temp 97.9 F (36.6 C)   Resp 16   Ht 6' (1.829 m)  Wt 266 lb (120.7 kg)   SpO2 96%   BMI 36.08 kg/m    Physical Exam Constitutional:      General: He is not in acute distress.    Appearance: He is well-developed. He is not diaphoretic.  HENT:     Head: Normocephalic and atraumatic.     Mouth/Throat:     Pharynx: No oropharyngeal exudate.  Eyes:     Pupils: Pupils are equal, round, and reactive to light.  Neck:     Thyroid: No thyromegaly.     Vascular: No JVD.     Trachea: No tracheal deviation.  Cardiovascular:     Rate and Rhythm: Normal rate and regular rhythm.     Heart sounds: Normal heart sounds. No murmur heard.  No friction rub. No gallop.   Pulmonary:     Effort: Pulmonary effort is normal. No respiratory distress.     Breath sounds: No wheezing or rales.  Chest:     Chest wall: No tenderness.  Abdominal:     General: Bowel sounds are normal.     Palpations: Abdomen is soft.  Musculoskeletal:        General: Normal range of motion.     Cervical back: Normal range of motion and neck supple.  Lymphadenopathy:     Cervical: No cervical adenopathy.  Skin:    General: Skin is warm and dry.  Neurological:     Mental Status: He is alert and oriented to person, place, and time.     Cranial Nerves: No cranial nerve deficit.  Psychiatric:        Behavior: Behavior normal.        Thought Content:  Thought content normal.        Judgment: Judgment normal.    Assessment/Plan: 1. Other chronic postprocedural pain Pt had h/o knee replacement and has chronic pain, will continue on Tramadol for now, might need to add gabapentin  - traMADol (ULTRAM) 50 MG tablet; Take one tab po bid prn for pain  Dispense: 60 tablet; Refill: 0  2. Essential hypertension, benign Bp is elevated for a while, start antihypertensives as prescribed today  - bisoprolol-hydrochlorothiazide (ZIAC) 2.5-6.25 MG tablet; Take one tab po qam for BP  Dispense: 30 tablet; Refill: 2  3. GAD (generalized anxiety disorder) Willing to taper Xanax and start low dose wellbutrin  - buPROPion (WELLBUTRIN XL) 150 MG 24 hr tablet; TAKE ONE TAB PO QD IN AM  FOR ANXIETY  Dispense: 30 tablet; Refill: 2 - ALPRAZolam (XANAX) 0.5 MG tablet; Take one tab a day prn for anxiety  Dispense: 30 tablet; Refill: 1  4. Mixed hyperlipidemia Previous lipid profile is abnormal however he thinks he was not fasting  - Lipid Panel With LDL/HDL Ratio  5. Special screening, prostate cancer Will schedule pt for CPE  - PSA  6. Restless legs - CBC with Differential/Platelet, might need Requip  - Fe+TIBC+Fer  7. OSA (obstructive sleep apnea) - Pt has OSA and has not used CPAP, but is willing to try, we might have to get Auto pap. his O2 sats were low as well, might need oxygen therapy cyclobenzaprine (FLEXERIL) 10 MG tablet; Take one tab po qhs for muscle spasm  Dispense: 30 tablet; Refill: 2  General Counseling: inaki vantine understanding of the findings of todays visit and agrees with plan of treatment. I have discussed any further diagnostic evaluation that may be needed or ordered today. We also reviewed his medications today. he has been encouraged to  call the office with any questions or concerns that should arise related to todays visit. Orders Placed This Encounter  Procedures  . CBC with Differential/Platelet  . Lipid Panel With  LDL/HDL Ratio  . TSH  . T4, free  . Comprehensive metabolic panel  . Urinalysis  . Fe+TIBC+Fer    Meds ordered this encounter  Medications  . traMADol (ULTRAM) 50 MG tablet    Sig: Take one tab po bid prn for pain    Dispense:  60 tablet    Refill:  0  . bisoprolol-hydrochlorothiazide (ZIAC) 2.5-6.25 MG tablet    Sig: Take one tab po qam for BP    Dispense:  30 tablet    Refill:  2  . buPROPion (WELLBUTRIN XL) 150 MG 24 hr tablet    Sig: TAKE ONE TAB PO QD IN AM  FOR ANXIETY    Dispense:  30 tablet    Refill:  2  . cyclobenzaprine (FLEXERIL) 10 MG tablet    Sig: Take one tab po qhs for muscle spasm    Dispense:  30 tablet    Refill:  2  . ALPRAZolam (XANAX) 0.5 MG tablet    Sig: Take one tab a day prn for anxiety    Dispense:  30 tablet    Refill:  1    Total time spent:35  Minutes Time spent includes review of chart, medications, test results, and follow up plan with the patient.      Dr Lavera Guise Internal medicine

## 2020-07-07 ENCOUNTER — Other Ambulatory Visit: Payer: Self-pay | Admitting: Adult Health

## 2020-07-07 DIAGNOSIS — K219 Gastro-esophageal reflux disease without esophagitis: Secondary | ICD-10-CM

## 2020-07-08 ENCOUNTER — Encounter: Payer: Self-pay | Admitting: Internal Medicine

## 2020-07-08 ENCOUNTER — Other Ambulatory Visit: Payer: Self-pay

## 2020-07-08 ENCOUNTER — Ambulatory Visit (INDEPENDENT_AMBULATORY_CARE_PROVIDER_SITE_OTHER): Payer: Medicare HMO | Admitting: Internal Medicine

## 2020-07-08 VITALS — BP 126/100 | HR 88 | Temp 97.9°F | Resp 16 | Ht 73.0 in | Wt 270.0 lb

## 2020-07-08 DIAGNOSIS — R7303 Prediabetes: Secondary | ICD-10-CM | POA: Diagnosis not present

## 2020-07-08 DIAGNOSIS — G4733 Obstructive sleep apnea (adult) (pediatric): Secondary | ICD-10-CM

## 2020-07-08 DIAGNOSIS — I10 Essential (primary) hypertension: Secondary | ICD-10-CM

## 2020-07-08 DIAGNOSIS — Z6835 Body mass index (BMI) 35.0-35.9, adult: Secondary | ICD-10-CM

## 2020-07-08 DIAGNOSIS — Z0001 Encounter for general adult medical examination with abnormal findings: Secondary | ICD-10-CM | POA: Diagnosis not present

## 2020-07-08 DIAGNOSIS — F17210 Nicotine dependence, cigarettes, uncomplicated: Secondary | ICD-10-CM | POA: Diagnosis not present

## 2020-07-08 DIAGNOSIS — M159 Polyosteoarthritis, unspecified: Secondary | ICD-10-CM | POA: Diagnosis not present

## 2020-07-08 DIAGNOSIS — R300 Dysuria: Secondary | ICD-10-CM

## 2020-07-08 MED ORDER — NICOTINE 21 MG/24HR TD PT24: 21.0000 mg | MEDICATED_PATCH | Freq: Every day | TRANSDERMAL | 0 refills | Status: DC

## 2020-07-08 NOTE — Progress Notes (Signed)
[facility] [place], [place] [address]  Internal MEDICINE  Office Visit Note  Patient Name: Zachary Holmes  [id number]  [id number]  Date of Service: 07/08/2020  Chief Complaint  Patient presents with  . Annual Exam  . Quality Metric Gaps    Hep C screen, Tdap vacc  . controlled substance policy    scanned     HPI Pt is here for routine health maintenance examination. He was started on Ziac for blood pressure control. He was also started on Wellbutrin, tolerated well , looking into getting help with smoking cessation, will like to have Nicotine patches. He is not willing to use his CPAP. Severe OA, bilatertal knee replacements, takes tramadol for pain control, limited activity, he was unable to go for labs. Colonoscopy every 3-5 years due to presence of polyps   Current Medication: Outpatient Encounter Medications as of 07/08/2020  Medication Sig  . ALPRAZolam (XANAX) 0.5 MG tablet Take one tab a day prn for anxiety  . bisoprolol-hydrochlorothiazide (ZIAC) 2.5-6.25 MG tablet Take one tab po qam for BP  . buPROPion (WELLBUTRIN XL) 150 MG 24 hr tablet TAKE ONE TAB PO QD IN AM  FOR ANXIETY  . cyclobenzaprine (FLEXERIL) 10 MG tablet Take one tab po qhs for muscle spasm  . nicotine (NICODERM CQ - DOSED IN MG/24 HOURS) 21 mg/24hr patch Place 1 patch (21 mg total) onto the skin daily.  Marland Kitchen omeprazole (PRILOSEC) 40 MG capsule TAKE 1 CAPSULE EVERY DAY  . traMADol (ULTRAM) 50 MG tablet Take one tab po bid prn for pain  . nicotine (NICODERM CQ) 21 mg/24hr patch Place 1 patch (21 mg total) onto the skin daily. For smoking cessation (family hx of Alpha1 antitrypsin def)   No facility-administered encounter medications on file as of 07/08/2020.    Surgical History: Past Surgical History:  Procedure Laterality Date  . ANKLE FUSION Right 11/24/2007   Archie Endo 12/04/2010  . COLONOSCOPY WITH PROPOFOL N/A 05/13/2017   Procedure: COLONOSCOPY WITH PROPOFOL;  Surgeon: Lucilla Lame, MD;  Location: [place];  Service: Endoscopy;  Laterality: N/A;  requests early  . FRACTURE SURGERY    . JOINT REPLACEMENT    . KNEE ARTHROCENTESIS Left X 5   "got infected . . ."  . MULTIPLE TOOTH EXTRACTIONS    . SHOULDER ARTHROSCOPY Left 06/2007   Archie Endo 12/03/2010  . TOTAL KNEE ARTHROPLASTY Left 2008   Archie Endo 04/09/2010  . TOTAL SHOULDER ARTHROPLASTY Left 09/20/2014  . TOTAL SHOULDER ARTHROPLASTY Left 09/20/2014   Procedure: TOTAL SHOULDER ARTHROPLASTY;  Surgeon: Nita Sells, MD;  Location: [place];  Service: Orthopedics;  Laterality: Left;  Left total shoulder arthroplasty  . TOTAL SHOULDER ARTHROPLASTY Right 06/02/2018   Procedure: RIGHT TOTAL SHOULDER ARTHROPLASTY;  Surgeon: Tania Ade, MD;  Location: [place];  Service: Orthopedics;  Laterality: Right;    Medical History: Past Medical History:  Diagnosis Date  . Anxiety   . Arthritis    "legs" (09/20/2014)  . Depression   . GERD (gastroesophageal reflux disease)   . Hypertension   . OA (osteoarthritis) of shoulder    right  . Seizures ([place]) 1983   x1, S/P head injury  . Serratia marcescens infection 2011   left knee  . Umbilical hernia   . Wears dentures    partial upper    Family History: Family History  Problem Relation Age of Onset  . COPD Mother   . Heart disease Father    Social History  Socioeconomic History  . Marital status: Single    Spouse name: Not on file  . Number of children: Not on file  . Years of education: Not on file  . Highest education level: Not on file  Occupational History  . Not on file  Tobacco Use  . Smoking status: Current Every Day Smoker    Packs/day: 0.50    Years: 20.00    Pack years: 10.00    Types: Cigarettes  . Smokeless tobacco: Former Systems developer    Types: Chew  . Tobacco comment: pcp PCP had given pt patch.  needs referal from PCP for insurance to pay  Vaping Use  . Vaping Use: Never used  Substance and Sexual Activity  . Alcohol use: Not  Currently    Alcohol/week: 12.0 standard drinks    Types: 12 Cans of beer per week    Comment: SOBER SINCE 05/17/2017  . Drug use: Yes    Types: Marijuana    Comment: DAILY  . Sexual activity: Not Currently  Other Topics Concern  . Not on file  Social History Narrative  . Not on file   Social Determinants of Health   Financial Resource Strain:   . Difficulty of Paying Living Expenses: Not on file  Food Insecurity:   . Worried About Charity fundraiser in the Last Year: Not on file  . Ran Out of Food in the Last Year: Not on file  Transportation Needs:   . Lack of Transportation (Medical): Not on file  . Lack of Transportation (Non-Medical): Not on file  Physical Activity:   . Days of Exercise per Week: Not on file  . Minutes of Exercise per Session: Not on file  Stress:   . Feeling of Stress : Not on file  Social Connections:   . Frequency of Communication with Friends and Family: Not on file  . Frequency of Social Gatherings with Friends and Family: Not on file  . Attends Religious Services: Not on file  . Active Member of Clubs or Organizations: Not on file  . Attends Archivist Meetings: Not on file  . Marital Status: Not on file      Review of Systems  Constitutional: Negative for chills, fatigue and unexpected weight change.  HENT: Negative for congestion, postnasal drip, rhinorrhea, sneezing and sore throat.   Eyes: Negative for redness.  Respiratory: Negative for cough, chest tightness and shortness of breath.   Cardiovascular: Negative for chest pain and palpitations.  Gastrointestinal: Negative for abdominal pain, constipation, diarrhea, nausea and vomiting.  Genitourinary: Negative for dysuria and frequency.  Musculoskeletal: Positive for arthralgias. Negative for back pain, joint swelling and neck pain.  Skin: Negative for rash.  Neurological: Negative.  Negative for tremors and numbness.  Hematological: Negative for adenopathy. Does not  bruise/bleed easily.  Psychiatric/Behavioral: Negative for behavioral problems (Depression), sleep disturbance and suicidal ideas. The patient is not nervous/anxious.      Vital Signs: BP (!) 126/100   Pulse 88   Temp 97.9 F (36.6 C)   Resp 16   Ht 6\' 1"  (1.854 m)   Wt 270 lb (122.5 kg)   SpO2 96%   BMI 35.62 kg/m    Physical Exam Constitutional:      General: He is not in acute distress.    Appearance: He is well-developed. He is not diaphoretic.  HENT:     Head: Normocephalic and atraumatic.     Mouth/Throat:     Pharynx: No oropharyngeal exudate.  Eyes:     Pupils: Pupils are equal, round, and reactive to light.  Neck:     Thyroid: No thyromegaly.     Vascular: No JVD.     Trachea: No tracheal deviation.  Cardiovascular:     Rate and Rhythm: Normal rate and regular rhythm.     Heart sounds: Normal heart sounds. No murmur heard.  No friction rub. No gallop.   Pulmonary:     Effort: Pulmonary effort is normal. No respiratory distress.     Breath sounds: No wheezing or rales.  Chest:     Chest wall: No tenderness.  Abdominal:     General: Bowel sounds are normal.     Palpations: Abdomen is soft.  Musculoskeletal:        General: Swelling and tenderness present. Normal range of motion.     Cervical back: Normal range of motion and neck supple.  Lymphadenopathy:     Cervical: No cervical adenopathy.  Skin:    General: Skin is warm and dry.  Neurological:     Mental Status: He is alert and oriented to person, place, and time.     Cranial Nerves: No cranial nerve deficit.  Psychiatric:        Behavior: Behavior normal.        Thought Content: Thought content normal.        Judgment: Judgment normal.    Assessment/Plan: 1. Encounter for general adult medical examination with abnormal findings Pt is updated as follows for Preventive Health Maintenance (PHM) AWV  Colonoscopy  BMD  PSA   2. Essential hypertension, benign Improved, continue on Ziac as  before  3. Cigarette nicotine dependence without complication Pt has been having hard time with smoking cessation, Options are very expensive, will try to get authorization for Nicotine patch or gum  - nicotine (NICODERM CQ) 21 mg/24hr patch; Place 1 patch (21 mg total) onto the skin daily. For smoking cessation (family hx of Alpha1 antitrypsin def)  Dispense: 15 patch; Refill: 0  4. Generalized osteoarthrosis Pt has severe OA, Bilateral knee replacement, limited mobility   5. OSA (obstructive sleep apnea) Pt is not willing for CPAP at this time, however will look into this   6. BMI 35.0-35.9,adult Encouraged wt loss   7. Prediabetes Will monitor and enroll in wellness and wt loss program   8. Dysuria - UA/M w/rflx Culture, Routine  General Counseling: eden rho understanding of the findings of todays visit and agrees with plan of treatment. I have discussed any further diagnostic evaluation that may be needed or ordered today. We also reviewed his medications today. he has been encouraged to call the office with any questions or concerns that should arise related to todays visit.  Counseling: Obesity Counseling: Risk Assessment: An assessment of behavioral risk factors was made today and includes lack of exercise sedentary lifestyle, lack of portion control and poor dietary habits.  Risk Modification Advice: She was counseled on portion control guidelines. Restricting daily caloric intake to. . The detrimental long term effects of obesity on her health and ongoing poor compliance was also discussed with the patient.  Orders Placed This Encounter  Procedures  . UA/M w/rflx Culture, Routine    Meds ordered this encounter  Medications  . nicotine (NICODERM CQ) 21 mg/24hr patch    Sig: Place 1 patch (21 mg total) onto the skin daily. For smoking cessation (family hx of Alpha1 antitrypsin def)    Dispense:  15 patch    Refill:  0    Total time spent: 30 Minutes  Time  spent includes review of chart, medications, test results, and follow up plan with the patient.   Lavera Guise, MD  Internal Medicine

## 2020-07-09 ENCOUNTER — Telehealth: Payer: Self-pay

## 2020-07-09 DIAGNOSIS — E782 Mixed hyperlipidemia: Secondary | ICD-10-CM | POA: Diagnosis not present

## 2020-07-09 DIAGNOSIS — D509 Iron deficiency anemia, unspecified: Secondary | ICD-10-CM | POA: Diagnosis not present

## 2020-07-09 DIAGNOSIS — E611 Iron deficiency: Secondary | ICD-10-CM | POA: Diagnosis not present

## 2020-07-09 DIAGNOSIS — G2581 Restless legs syndrome: Secondary | ICD-10-CM | POA: Diagnosis not present

## 2020-07-09 DIAGNOSIS — M1990 Unspecified osteoarthritis, unspecified site: Secondary | ICD-10-CM | POA: Diagnosis not present

## 2020-07-09 DIAGNOSIS — Z125 Encounter for screening for malignant neoplasm of prostate: Secondary | ICD-10-CM | POA: Diagnosis not present

## 2020-07-09 DIAGNOSIS — I10 Essential (primary) hypertension: Secondary | ICD-10-CM | POA: Diagnosis not present

## 2020-07-09 LAB — UA/M W/RFLX CULTURE, ROUTINE
Bilirubin, UA: NEGATIVE
Glucose, UA: NEGATIVE
Ketones, UA: NEGATIVE
Leukocytes,UA: NEGATIVE
Nitrite, UA: NEGATIVE
Protein,UA: NEGATIVE
RBC, UA: NEGATIVE
Specific Gravity, UA: 1.023 (ref 1.005–1.030)
Urobilinogen, Ur: 0.2 mg/dL (ref 0.2–1.0)
pH, UA: 5 (ref 5.0–7.5)

## 2020-07-09 LAB — MICROSCOPIC EXAMINATION
Bacteria, UA: NONE SEEN
Casts: NONE SEEN /lpf
Epithelial Cells (non renal): NONE SEEN /hpf (ref 0–10)
WBC, UA: NONE SEEN /hpf (ref 0–5)

## 2020-07-09 NOTE — Telephone Encounter (Signed)
LMOM for pt to call back to give information on which he prefers to use for smoking cessation that insurance will cover.

## 2020-07-09 NOTE — Telephone Encounter (Signed)
-----   Message from Lavera Guise, MD sent at 07/08/2020  2:50 PM EST ----- Nicotine patch/// lozenges???

## 2020-07-10 LAB — LIPID PANEL WITH LDL/HDL RATIO
Cholesterol, Total: 237 mg/dL — ABNORMAL HIGH (ref 100–199)
HDL: 32 mg/dL — ABNORMAL LOW (ref >39)
LDL Chol Calc (NIH): 168 mg/dL — ABNORMAL HIGH (ref 0–99)
LDL/HDL Ratio: 5.3 ratio — ABNORMAL HIGH (ref 0.0–3.6)
Triglycerides: 198 mg/dL — ABNORMAL HIGH (ref 0–149)
VLDL Cholesterol Cal: 37 mg/dL (ref 5–40)

## 2020-07-10 LAB — T4, FREE: Free T4: 1.03 ng/dL (ref 0.82–1.77)

## 2020-07-10 LAB — CBC WITH DIFFERENTIAL/PLATELET
Basophils Absolute: 0.1 10*3/uL (ref 0.0–0.2)
Basos: 1 %
EOS (ABSOLUTE): 0.4 10*3/uL (ref 0.0–0.4)
Eos: 3 %
Hematocrit: 48.1 % (ref 37.5–51.0)
Hemoglobin: 16.5 g/dL (ref 13.0–17.7)
Immature Grans (Abs): 0 10*3/uL (ref 0.0–0.1)
Immature Granulocytes: 0 %
Lymphocytes Absolute: 2.6 10*3/uL (ref 0.7–3.1)
Lymphs: 24 %
MCH: 30 pg (ref 26.6–33.0)
MCHC: 34.3 g/dL (ref 31.5–35.7)
MCV: 88 fL (ref 79–97)
Monocytes Absolute: 0.4 10*3/uL (ref 0.1–0.9)
Monocytes: 3 %
Neutrophils Absolute: 7.5 10*3/uL — ABNORMAL HIGH (ref 1.4–7.0)
Neutrophils: 69 %
Platelets: 274 10*3/uL (ref 150–450)
RBC: 5.5 x10E6/uL (ref 4.14–5.80)
RDW: 12.9 % (ref 11.6–15.4)
WBC: 11 10*3/uL — ABNORMAL HIGH (ref 3.4–10.8)

## 2020-07-10 LAB — COMPREHENSIVE METABOLIC PANEL
ALT: 18 IU/L (ref 0–44)
AST: 18 IU/L (ref 0–40)
Albumin/Globulin Ratio: 1.5 (ref 1.2–2.2)
Albumin: 4.3 g/dL (ref 3.8–4.9)
Alkaline Phosphatase: 95 IU/L (ref 44–121)
BUN/Creatinine Ratio: 16 (ref 9–20)
BUN: 17 mg/dL (ref 6–24)
Bilirubin Total: 0.6 mg/dL (ref 0.0–1.2)
CO2: 22 mmol/L (ref 20–29)
Calcium: 9.3 mg/dL (ref 8.7–10.2)
Chloride: 101 mmol/L (ref 96–106)
Creatinine, Ser: 1.08 mg/dL (ref 0.76–1.27)
GFR calc Af Amer: 88 mL/min/{1.73_m2} (ref >59)
GFR calc non Af Amer: 76 mL/min/{1.73_m2} (ref >59)
Globulin, Total: 2.9 g/dL (ref 1.5–4.5)
Glucose: 131 mg/dL — ABNORMAL HIGH (ref 65–99)
Potassium: 4.2 mmol/L (ref 3.5–5.2)
Sodium: 137 mmol/L (ref 134–144)
Total Protein: 7.2 g/dL (ref 6.0–8.5)

## 2020-07-10 LAB — IRON,TIBC AND FERRITIN PANEL
Ferritin: 181 ng/mL (ref 30–400)
Iron Saturation: 23 % (ref 15–55)
Iron: 92 ug/dL (ref 38–169)
Total Iron Binding Capacity: 393 ug/dL (ref 250–450)
UIBC: 301 ug/dL (ref 111–343)

## 2020-07-10 LAB — TSH: TSH: 2.25 u[IU]/mL (ref 0.450–4.500)

## 2020-07-10 LAB — PSA: Prostate Specific Ag, Serum: 0.5 ng/mL (ref 0.0–4.0)

## 2020-07-12 ENCOUNTER — Telehealth: Payer: Self-pay

## 2020-07-12 NOTE — Telephone Encounter (Signed)
-----   Message from Lavera Guise, MD sent at 07/08/2020  2:50 PM EST ----- Nicotine patch/// lozenges???

## 2020-07-24 ENCOUNTER — Telehealth: Payer: Self-pay

## 2020-07-24 NOTE — Telephone Encounter (Signed)
Left message advising of rescheduling appt date in January, appt changed asked pt to call back if date doesn't work. Zachary Holmes

## 2020-07-29 ENCOUNTER — Ambulatory Visit: Payer: Medicare HMO | Admitting: Nurse Practitioner

## 2020-08-20 ENCOUNTER — Ambulatory Visit: Payer: Medicare HMO | Admitting: Internal Medicine

## 2020-08-22 ENCOUNTER — Other Ambulatory Visit: Payer: Self-pay

## 2020-08-22 ENCOUNTER — Ambulatory Visit (INDEPENDENT_AMBULATORY_CARE_PROVIDER_SITE_OTHER): Payer: Medicare HMO | Admitting: Internal Medicine

## 2020-08-22 ENCOUNTER — Encounter: Payer: Self-pay | Admitting: Internal Medicine

## 2020-08-22 VITALS — BP 145/92 | HR 86 | Temp 97.6°F | Resp 16 | Ht 73.0 in | Wt 274.6 lb

## 2020-08-22 DIAGNOSIS — I10 Essential (primary) hypertension: Secondary | ICD-10-CM

## 2020-08-22 DIAGNOSIS — F17210 Nicotine dependence, cigarettes, uncomplicated: Secondary | ICD-10-CM | POA: Diagnosis not present

## 2020-08-22 DIAGNOSIS — F411 Generalized anxiety disorder: Secondary | ICD-10-CM | POA: Diagnosis not present

## 2020-08-22 DIAGNOSIS — R7309 Other abnormal glucose: Secondary | ICD-10-CM | POA: Diagnosis not present

## 2020-08-22 DIAGNOSIS — G8928 Other chronic postprocedural pain: Secondary | ICD-10-CM

## 2020-08-22 DIAGNOSIS — E782 Mixed hyperlipidemia: Secondary | ICD-10-CM

## 2020-08-22 LAB — POCT GLYCOSYLATED HEMOGLOBIN (HGB A1C): Hemoglobin A1C: 5.8 % — AB (ref 4.0–5.6)

## 2020-08-22 MED ORDER — AMLODIPINE BESYLATE 5 MG PO TABS: 5.0000 mg | ORAL_TABLET | Freq: Every day | ORAL | 3 refills | Status: DC

## 2020-08-22 MED ORDER — BUPROPION HCL ER (XL) 150 MG PO TB24: ORAL_TABLET | ORAL | 1 refills | Status: DC

## 2020-08-22 MED ORDER — TRAMADOL HCL 50 MG PO TABS: ORAL_TABLET | ORAL | 1 refills | Status: DC

## 2020-08-22 NOTE — Progress Notes (Signed)
[facility] [place], [place] [address]  Internal MEDICINE  Office Visit Note  Patient Name: Zachary Holmes  [id number]  [id number]  Date of Service: 08/26/2020  Chief Complaint  Patient presents with  . Follow-up    Every since medication change has been having dry mouth  . Depression  . Gastroesophageal Reflux  . Hypertension    HPI Pt is here for routine follow up. He is c/o dry mouth due to BP medicine. BP is still elevated, chronic knee pain, Blood glucose is abnormal as well He was also started on Wellbutrin, tolerated well , looking into getting help with smoking cessation, about to start Nicotine patches. He is not willing to use his CPAP. Severe OA, bilatertal knee replacements, takes tramadol for pain control, limited activity Labs reviewed, abnormal lipid profile. Current Medication: Outpatient Encounter Medications as of 08/22/2020  Medication Sig  . amLODipine (NORVASC) 5 MG tablet Take 1 tablet (5 mg total) by mouth daily.  Marland Kitchen ALPRAZolam (XANAX) 0.5 MG tablet Take one tab a day prn for anxiety  . buPROPion (WELLBUTRIN XL) 150 MG 24 hr tablet TAKE ONE TAB PO QD IN AM  FOR ANXIETY  . cyclobenzaprine (FLEXERIL) 10 MG tablet Take one tab po qhs for muscle spasm  . nicotine (NICODERM CQ - DOSED IN MG/24 HOURS) 21 mg/24hr patch Place 1 patch (21 mg total) onto the skin daily.  . nicotine (NICODERM CQ) 21 mg/24hr patch Place 1 patch (21 mg total) onto the skin daily. For smoking cessation (family hx of Alpha1 antitrypsin def)  . omeprazole (PRILOSEC) 40 MG capsule TAKE 1 CAPSULE EVERY DAY  . traMADol (ULTRAM) 50 MG tablet Take one tab po bid prn for pain  . [DISCONTINUED] bisoprolol-hydrochlorothiazide (ZIAC) 2.5-6.25 MG tablet Take one tab po qam for BP  . [DISCONTINUED] buPROPion (WELLBUTRIN XL) 150 MG 24 hr tablet TAKE ONE TAB PO QD IN AM  FOR ANXIETY  . [DISCONTINUED] traMADol (ULTRAM) 50 MG tablet Take one tab po bid prn for pain   No  facility-administered encounter medications on file as of 08/22/2020.    Surgical History: Past Surgical History:  Procedure Laterality Date  . ANKLE FUSION Right 11/24/2007   Archie Endo 12/04/2010  . COLONOSCOPY WITH PROPOFOL N/A 05/13/2017   Procedure: COLONOSCOPY WITH PROPOFOL;  Surgeon: Lucilla Lame, MD;  Location: [place];  Service: Endoscopy;  Laterality: N/A;  requests early  . FRACTURE SURGERY    . JOINT REPLACEMENT    . KNEE ARTHROCENTESIS Left X 5   "got infected . . ."  . MULTIPLE TOOTH EXTRACTIONS    . SHOULDER ARTHROSCOPY Left 06/2007   Archie Endo 12/03/2010  . TOTAL KNEE ARTHROPLASTY Left 2008   Archie Endo 04/09/2010  . TOTAL SHOULDER ARTHROPLASTY Left 09/20/2014  . TOTAL SHOULDER ARTHROPLASTY Left 09/20/2014   Procedure: TOTAL SHOULDER ARTHROPLASTY;  Surgeon: Nita Sells, MD;  Location: [place];  Service: Orthopedics;  Laterality: Left;  Left total shoulder arthroplasty  . TOTAL SHOULDER ARTHROPLASTY Right 06/02/2018   Procedure: RIGHT TOTAL SHOULDER ARTHROPLASTY;  Surgeon: Tania Ade, MD;  Location: [place];  Service: Orthopedics;  Laterality: Right;    Medical History: Past Medical History:  Diagnosis Date  . Anxiety   . Arthritis    "legs" (09/20/2014)  . Depression   . GERD (gastroesophageal reflux disease)   . Hypertension   . OA (osteoarthritis) of shoulder    right  . Seizures ([place]) 1983   x1, S/P head injury  .  Serratia marcescens infection 2011   left knee  . Umbilical hernia   . Wears dentures    partial upper    Family History: Family History  Problem Relation Age of Onset  . COPD Mother   . Heart disease Father     Social History   Socioeconomic History  . Marital status: Single    Spouse name: Not on file  . Number of children: Not on file  . Years of education: Not on file  . Highest education level: Not on file  Occupational History  . Not on file  Tobacco Use  . Smoking status: Current Every Day Smoker    Packs/day:  0.50    Years: 20.00    Pack years: 10.00    Types: Cigarettes  . Smokeless tobacco: Never Used  . Tobacco comment: pcp PCP had given pt patch.  needs referal from PCP for insurance to pay  Vaping Use  . Vaping Use: Never used  Substance and Sexual Activity  . Alcohol use: Not Currently    Alcohol/week: 12.0 standard drinks    Types: 12 Cans of beer per week    Comment: SOBER SINCE 05/17/2017  . Drug use: Yes    Types: Marijuana    Comment: DAILY  . Sexual activity: Not Currently  Other Topics Concern  . Not on file  Social History Narrative  . Not on file   Social Determinants of Health   Financial Resource Strain: Not on file  Food Insecurity: Not on file  Transportation Needs: Not on file  Physical Activity: Not on file  Stress: Not on file  Social Connections: Not on file  Intimate Partner Violence: Not on file      Review of Systems  Constitutional: Negative for chills, fatigue and unexpected weight change.  HENT: Positive for postnasal drip. Negative for congestion, rhinorrhea, sneezing and sore throat.   Eyes: Negative for redness.  Respiratory: Negative for cough, chest tightness and shortness of breath.   Cardiovascular: Negative for chest pain and palpitations.  Gastrointestinal: Negative for abdominal pain, constipation, diarrhea, nausea and vomiting.  Genitourinary: Negative for dysuria and frequency.  Musculoskeletal: Negative for arthralgias, back pain, joint swelling and neck pain.  Skin: Negative for rash.  Neurological: Negative.  Negative for tremors and numbness.  Hematological: Negative for adenopathy. Does not bruise/bleed easily.  Psychiatric/Behavioral: Negative for behavioral problems (Depression), sleep disturbance and suicidal ideas. The patient is not nervous/anxious.     Vital Signs: BP (!) 145/92   Pulse 86   Temp 97.6 F (36.4 C)   Resp 16   Ht 6\' 1"  (1.854 m)   Wt 274 lb 9.6 oz (124.6 kg)   SpO2 95%   BMI 36.23 kg/m     Physical Exam Constitutional:      General: He is not in acute distress.    Appearance: He is well-developed and well-nourished. He is not diaphoretic.  HENT:     Head: Normocephalic and atraumatic.     Mouth/Throat:     Mouth: Oropharynx is clear and moist.     Pharynx: No oropharyngeal exudate.  Eyes:     Extraocular Movements: EOM normal.     Pupils: Pupils are equal, round, and reactive to light.  Neck:     Thyroid: No thyromegaly.     Vascular: No JVD.     Trachea: No tracheal deviation.  Cardiovascular:     Rate and Rhythm: Normal rate and regular rhythm.     Heart sounds:  Normal heart sounds. No murmur heard. No friction rub. No gallop.   Pulmonary:     Effort: Pulmonary effort is normal. No respiratory distress.     Breath sounds: No wheezing or rales.  Chest:     Chest wall: No tenderness.  Abdominal:     General: Bowel sounds are normal.     Palpations: Abdomen is soft.  Musculoskeletal:        General: No signs of injury.     Cervical back: Normal range of motion and neck supple.     Comments: Abnormal ROM bilateral knees   Lymphadenopathy:     Cervical: No cervical adenopathy.  Skin:    General: Skin is warm and dry.  Neurological:     Mental Status: He is alert and oriented to person, place, and time.     Cranial Nerves: No cranial nerve deficit.  Psychiatric:        Mood and Affect: Mood and affect normal.        Behavior: Behavior normal.        Thought Content: Thought content normal.        Judgment: Judgment normal.     Assessment/Plan: 1. Essential hypertension, benign Stop Ziac due to dryness in mouth, start Norvasc as prescribed today  - amLODipine (NORVASC) 5 MG tablet; Take 1 tablet (5 mg total) by mouth daily.  Dispense: 90 tablet; Refill: 3  2. Other chronic postprocedural pain Continue to take tramadol as needed  - traMADol (ULTRAM) 50 MG tablet; Take one tab po bid prn for pain  Dispense: 60 tablet; Refill: 1  3. GAD  (generalized anxiety disorder) Will continue, pt is improving  - buPROPion (WELLBUTRIN XL) 150 MG 24 hr tablet; TAKE ONE TAB PO QD IN AM  FOR ANXIETY  Dispense: 90 tablet; Refill: 1  4. Abnormal glucose Samples of Steglatro 15 mg ( take half tab aday, might switch to Rybelsus.  - POCT glycosylated hemoglobin (Hb A1C)  5. Cigarette nicotine dependence without complication - continue Wellbutrin and nicotine patch   6. Mixed hyperlipidemia He will need Statin therapy on next visit. Monitor TG, might need Dual agent    General Counseling: guerry covington understanding of the findings of todays visit and agrees with plan of treatment. I have discussed any further diagnostic evaluation that may be needed or ordered today. We also reviewed his medications today. he has been encouraged to call the office with any questions or concerns that should arise related to todays visit.    Orders Placed This Encounter  Procedures  . POCT glycosylated hemoglobin (Hb A1C)    Meds ordered this encounter  Medications  . amLODipine (NORVASC) 5 MG tablet    Sig: Take 1 tablet (5 mg total) by mouth daily.    Dispense:  90 tablet    Refill:  3  . traMADol (ULTRAM) 50 MG tablet    Sig: Take one tab po bid prn for pain    Dispense:  60 tablet    Refill:  1  . buPROPion (WELLBUTRIN XL) 150 MG 24 hr tablet    Sig: TAKE ONE TAB PO QD IN AM  FOR ANXIETY    Dispense:  90 tablet    Refill:  1    Total time spent: 35Minutes Time spent includes review of chart, medications, test results, and follow up plan with the patient.      Dr Lavera Guise Internal medicine

## 2020-10-01 ENCOUNTER — Other Ambulatory Visit: Payer: Self-pay | Admitting: Adult Health

## 2020-10-01 DIAGNOSIS — G4733 Obstructive sleep apnea (adult) (pediatric): Secondary | ICD-10-CM

## 2020-10-04 ENCOUNTER — Other Ambulatory Visit: Payer: Self-pay

## 2020-10-04 DIAGNOSIS — G4733 Obstructive sleep apnea (adult) (pediatric): Secondary | ICD-10-CM

## 2020-10-04 MED ORDER — CYCLOBENZAPRINE HCL 10 MG PO TABS: ORAL_TABLET | ORAL | 2 refills | Status: DC

## 2020-10-24 ENCOUNTER — Encounter: Payer: Self-pay | Admitting: Physician Assistant

## 2020-10-24 ENCOUNTER — Ambulatory Visit (INDEPENDENT_AMBULATORY_CARE_PROVIDER_SITE_OTHER): Payer: Medicare HMO | Admitting: Physician Assistant

## 2020-10-24 DIAGNOSIS — F411 Generalized anxiety disorder: Secondary | ICD-10-CM | POA: Diagnosis not present

## 2020-10-24 DIAGNOSIS — E782 Mixed hyperlipidemia: Secondary | ICD-10-CM

## 2020-10-24 DIAGNOSIS — G4733 Obstructive sleep apnea (adult) (pediatric): Secondary | ICD-10-CM

## 2020-10-24 DIAGNOSIS — G8928 Other chronic postprocedural pain: Secondary | ICD-10-CM

## 2020-10-24 DIAGNOSIS — Z6835 Body mass index (BMI) 35.0-35.9, adult: Secondary | ICD-10-CM

## 2020-10-24 DIAGNOSIS — Z79899 Other long term (current) drug therapy: Secondary | ICD-10-CM

## 2020-10-24 DIAGNOSIS — I10 Essential (primary) hypertension: Secondary | ICD-10-CM | POA: Diagnosis not present

## 2020-10-24 DIAGNOSIS — F17210 Nicotine dependence, cigarettes, uncomplicated: Secondary | ICD-10-CM

## 2020-10-24 LAB — POCT URINE DRUG SCREEN
Methylenedioxyamphetamine: NOT DETECTED
POC Amphetamine UR: NOT DETECTED
POC BENZODIAZEPINES UR: NOT DETECTED
POC Barbiturate UR: NOT DETECTED
POC Cocaine UR: NOT DETECTED
POC Ecstasy UR: NOT DETECTED
POC Marijuana UR: POSITIVE — AB
POC Methadone UR: NOT DETECTED
POC Methamphetamine UR: NOT DETECTED
POC Opiate Ur: NOT DETECTED
POC Oxycodone UR: NOT DETECTED
POC PHENCYCLIDINE UR: NOT DETECTED
POC TRICYCLICS UR: POSITIVE — AB

## 2020-10-24 MED ORDER — TRAMADOL HCL 50 MG PO TABS: ORAL_TABLET | ORAL | 1 refills | Status: DC

## 2020-10-24 MED ORDER — ATORVASTATIN CALCIUM 10 MG PO TABS: 10.0000 mg | ORAL_TABLET | Freq: Every day | ORAL | 3 refills | Status: DC

## 2020-10-24 MED ORDER — ALPRAZOLAM 0.5 MG PO TABS: ORAL_TABLET | ORAL | 1 refills | Status: DC

## 2020-10-24 MED ORDER — CYCLOBENZAPRINE HCL 10 MG PO TABS: ORAL_TABLET | ORAL | 2 refills | Status: DC

## 2020-10-24 NOTE — Progress Notes (Signed)
[facility] [place], [place] [address]  Internal MEDICINE  Office Visit Note  Patient Name: Zachary Holmes  [id number]  [id number]  Date of Service: 10/24/2020  Chief Complaint  Patient presents with  . Depression  . Hypertension  . Gastroesophageal Reflux  . Medication Refill    Xanax,tramadol    HPI Pt is here for routine f/u. - He was given samples of steglatro last visit and told to take half a tab. Didn't do well on it and started having dry mouth again so he stopped it and isnt interested in an alternative at this point. -He had lab work done recently and has elevated cholesterol and TG and discussed he will need to start statin therapy -BP on recheck 130/90 -Smoking: he was waiting on starting nicotine patches until he receives the next lower dose of patches to have ready when he needs to taper down. He is smoking half ppd. Educated to not wear patch while he smokes a cigarette and to take it off and replace afterward. Stopped wellbutrin because of dry mouth concerns. He will restart now though. -Exercise limited because of chronic pain from previous accident--will try water exercises, he has a pool at home. He is requesting refill of tramadol. -he states he only uses xanax prn and does not take it everyday. When he does take it he reports he usually has to take two tabs of the .5mg  dose. He is requesting a refill today.  Current Medication: Outpatient Encounter Medications as of 10/24/2020  Medication Sig  . amLODipine (NORVASC) 5 MG tablet Take 1 tablet (5 mg total) by mouth daily.  Marland Kitchen atorvastatin (LIPITOR) 10 MG tablet Take 1 tablet (10 mg total) by mouth daily.  Marland Kitchen buPROPion (WELLBUTRIN XL) 150 MG 24 hr tablet TAKE ONE TAB PO QD IN AM  FOR ANXIETY  . nicotine (NICODERM CQ - DOSED IN MG/24 HOURS) 21 mg/24hr patch Place 1 patch (21 mg total) onto the skin daily.  . nicotine (NICODERM CQ) 21 mg/24hr patch Place 1 patch (21 mg total) onto the skin  daily. For smoking cessation (family hx of Alpha1 antitrypsin def)  . omeprazole (PRILOSEC) 40 MG capsule TAKE 1 CAPSULE EVERY DAY  . [DISCONTINUED] ALPRAZolam (XANAX) 0.5 MG tablet Take one tab a day prn for anxiety  . [DISCONTINUED] cyclobenzaprine (FLEXERIL) 10 MG tablet Take one tab po qhs for muscle spasm  . [DISCONTINUED] traMADol (ULTRAM) 50 MG tablet Take one tab po bid prn for pain  . ALPRAZolam (XANAX) 0.5 MG tablet Take one tab a day prn for anxiety  . cyclobenzaprine (FLEXERIL) 10 MG tablet Take one tab po qhs for muscle spasm  . traMADol (ULTRAM) 50 MG tablet Take one tab po bid prn for pain   No facility-administered encounter medications on file as of 10/24/2020.    Surgical History: Past Surgical History:  Procedure Laterality Date  . ANKLE FUSION Right 11/24/2007   Archie Endo 12/04/2010  . COLONOSCOPY WITH PROPOFOL N/A 05/13/2017   Procedure: COLONOSCOPY WITH PROPOFOL;  Surgeon: Lucilla Lame, MD;  Location: [facility];  Service: Endoscopy;  Laterality: N/A;  requests early  . FRACTURE SURGERY    . JOINT REPLACEMENT    . KNEE ARTHROCENTESIS Left X 5   "got infected . . ."  . MULTIPLE TOOTH EXTRACTIONS    . SHOULDER ARTHROSCOPY Left 06/2007   Archie Endo 12/03/2010  . TOTAL KNEE ARTHROPLASTY Left 2008   Archie Endo 04/09/2010  . TOTAL SHOULDER ARTHROPLASTY Left 09/20/2014  .  TOTAL SHOULDER ARTHROPLASTY Left 09/20/2014   Procedure: TOTAL SHOULDER ARTHROPLASTY;  Surgeon: Nita Sells, MD;  Location: [facility];  Service: Orthopedics;  Laterality: Left;  Left total shoulder arthroplasty  . TOTAL SHOULDER ARTHROPLASTY Right 06/02/2018   Procedure: RIGHT TOTAL SHOULDER ARTHROPLASTY;  Surgeon: Tania Ade, MD;  Location: [place];  Service: Orthopedics;  Laterality: Right;    Medical History: Past Medical History:  Diagnosis Date  . Anxiety   . Arthritis    "legs" (09/20/2014)  . Depression   . GERD (gastroesophageal reflux disease)   . Hypertension   . OA  (osteoarthritis) of shoulder    right  . Seizures ([place]) 1983   x1, S/P head injury  . Serratia marcescens infection 2011   left knee  . Umbilical hernia   . Wears dentures    partial upper    Family History: Family History  Problem Relation Age of Onset  . COPD Mother   . Heart disease Father     Social History   Socioeconomic History  . Marital status: Single    Spouse name: Not on file  . Number of children: Not on file  . Years of education: Not on file  . Highest education level: Not on file  Occupational History  . Not on file  Tobacco Use  . Smoking status: Current Every Day Smoker    Packs/day: 0.50    Years: 20.00    Pack years: 10.00    Types: Cigarettes  . Smokeless tobacco: Never Used  . Tobacco comment: pcp PCP had given pt patch.  needs referal from PCP for insurance to pay  Vaping Use  . Vaping Use: Never used  Substance and Sexual Activity  . Alcohol use: Not Currently    Alcohol/week: 12.0 standard drinks    Types: 12 Cans of beer per week    Comment: SOBER SINCE 05/17/2017  . Drug use: Yes    Types: Marijuana    Comment: DAILY  . Sexual activity: Not Currently  Other Topics Concern  . Not on file  Social History Narrative  . Not on file   Social Determinants of Health   Financial Resource Strain: Not on file  Food Insecurity: Not on file  Transportation Needs: Not on file  Physical Activity: Not on file  Stress: Not on file  Social Connections: Not on file  Intimate Partner Violence: Not on file      Review of Systems  Constitutional: Negative for chills, fatigue and unexpected weight change.  HENT: Negative for congestion, postnasal drip, rhinorrhea, sneezing and sore throat.        Dry mouth  Eyes: Negative for redness.  Respiratory: Negative for cough, chest tightness and shortness of breath.   Cardiovascular: Negative for chest pain and palpitations.  Gastrointestinal: Negative for abdominal pain, constipation, diarrhea,  nausea and vomiting.  Genitourinary: Negative for dysuria and frequency.  Musculoskeletal: Positive for arthralgias, back pain and myalgias. Negative for joint swelling and neck pain.  Skin: Negative for rash.  Neurological: Negative.  Negative for tremors and numbness.  Hematological: Negative for adenopathy. Does not bruise/bleed easily.  Psychiatric/Behavioral: Negative for behavioral problems (Depression), sleep disturbance and suicidal ideas. The patient is nervous/anxious.     Vital Signs: BP (!) 150/90   Pulse 96   Temp 97.8 F (36.6 C)   Resp 16   Ht 6' (1.829 m)   Wt 271 lb (122.9 kg)   SpO2 97%   BMI 36.75 kg/m  Physical Exam Constitutional:      General: He is not in acute distress.    Appearance: He is well-developed. He is obese. He is not diaphoretic.  HENT:     Head: Normocephalic and atraumatic.     Mouth/Throat:     Pharynx: No oropharyngeal exudate.  Eyes:     Pupils: Pupils are equal, round, and reactive to light.  Neck:     Thyroid: No thyromegaly.     Vascular: No JVD.     Trachea: No tracheal deviation.  Cardiovascular:     Rate and Rhythm: Normal rate and regular rhythm.     Heart sounds: Normal heart sounds. No murmur heard. No friction rub. No gallop.   Pulmonary:     Effort: Pulmonary effort is normal. No respiratory distress.     Breath sounds: No wheezing or rales.  Chest:     Chest wall: No tenderness.  Abdominal:     General: Bowel sounds are normal.     Palpations: Abdomen is soft.  Musculoskeletal:        General: Normal range of motion.     Cervical back: Normal range of motion and neck supple.     Comments: Chronic pain throughout legs, especially knees and back with movement  Lymphadenopathy:     Cervical: No cervical adenopathy.  Skin:    General: Skin is warm and dry.  Neurological:     Mental Status: He is alert and oriented to person, place, and time.     Cranial Nerves: No cranial nerve deficit.  Psychiatric:         Behavior: Behavior normal.        Thought Content: Thought content normal.        Judgment: Judgment normal.         Assessment/Plan: 1. Essential hypertension, benign Elevated BP in office on intake but improved on recheck, continue Norvasc.  2. GAD (generalized anxiety disorder) Pt will restart Wellbutrin daily and use xanax only as needed. - ALPRAZolam (XANAX) 0.5 MG tablet; Take one tab a day prn for anxiety  Dispense: 30 tablet; Refill: 1  3. Mixed hyperlipidemia Will start on statin due to elevated cholesterol and TG - atorvastatin (LIPITOR) 10 MG tablet; Take 1 tablet (10 mg total) by mouth daily.  Dispense: 90 tablet; Refill: 3  4. Cigarette nicotine dependence without complication Will restart wellbutrin and start on nicotine patches while continuing to decrease number of cigs per day. Educated to not wear patch if he is going to smoke a cig due to elevated risk of stroke. Pt understood and will remove patch prior to smoking a cig.  5. Other chronic postprocedural pain Hx of traumatic injury and knee replacement, he may continue on tramadol for pain. - traMADol (ULTRAM) 50 MG tablet; Take one tab po bid prn for pain  Dispense: 60 tablet; Refill: 1  6. OSA (obstructive sleep apnea) Pt never started on CPAP and has been against starting, does have muscle spasms for which he will use flexeril. - cyclobenzaprine (FLEXERIL) 10 MG tablet; Take one tab po qhs for muscle spasm  Dispense: 30 tablet; Refill: 2  7. Encounter for long-term (current) use of high-risk medication - POCT Urine Drug Screen  8. BMI 35.0-35.9,adult Pt will work on diet and exercise, he will utilize pool when weather permits to help increase activity while limiting pain. Pt would not continue Steglatro for elevated sugar/wt loss and is against starting alternative such as rybelsus at this time.  General Counseling: karan ramnauth understanding of the findings of todays visit and agrees with plan of  treatment. I have discussed any further diagnostic evaluation that may be needed or ordered today. We also reviewed his medications today. he has been encouraged to call the office with any questions or concerns that should arise related to todays visit.  Reviewed risks and possible side effects associated with taking opiates, benzodiazepines and other CNS depressants. Combination of these could cause dizziness and drowsiness. Advised patient not to drive or operate machinery when taking these medications, as patient's and other's life can be at risk and will have consequences. Patient verbalized understanding in this matter. Dependence and abuse for these drugs will be monitored closely. A Controlled substance policy and procedure is on file which allows [facility] to order a urine drug screen test at any visit. Patient understands and agrees with the plan   Orders Placed This Encounter  Procedures  . POCT Urine Drug Screen    Meds ordered this encounter  Medications  . cyclobenzaprine (FLEXERIL) 10 MG tablet    Sig: Take one tab po qhs for muscle spasm    Dispense:  30 tablet    Refill:  2  . atorvastatin (LIPITOR) 10 MG tablet    Sig: Take 1 tablet (10 mg total) by mouth daily.    Dispense:  90 tablet    Refill:  3  . ALPRAZolam (XANAX) 0.5 MG tablet    Sig: Take one tab a day prn for anxiety    Dispense:  30 tablet    Refill:  1  . traMADol (ULTRAM) 50 MG tablet    Sig: Take one tab po bid prn for pain    Dispense:  60 tablet    Refill:  1    This patient was seen by Drema Dallas, PA-C in collaboration with Dr. Clayborn Bigness as a part of collaborative care agreement.   Total time spent:30 Minutes Time spent includes review of chart, medications, test results, and follow up plan with the patient.      Dr Lavera Guise Internal medicine

## 2020-12-17 ENCOUNTER — Telehealth: Payer: Self-pay | Admitting: Internal Medicine

## 2020-12-17 NOTE — Progress Notes (Signed)
  Chronic Care Management   Outreach Note  12/17/2020 Name: Zachary Holmes MRN: [id number] DOB: 1961/07/11  Referred by: Lavera Guise, MD Reason for referral : No chief complaint on file.   An unsuccessful telephone outreach was attempted today. The patient was referred to the pharmacist for assistance with care management and care coordination.   Follow Up Plan:   Carley Perdue [employer] Scheduler

## 2020-12-25 ENCOUNTER — Telehealth: Payer: Self-pay | Admitting: Internal Medicine

## 2020-12-25 NOTE — Chronic Care Management (AMB) (Signed)
  Chronic Care Management   Note  12/25/2020 Name: CREE KUNERT MRN: [id number] DOB: June 23, 1961  KYM FENTER is a 60 y.o. year old male who is a primary care patient of Lavera Guise, MD. I reached out to Vicenta Dunning by phone today in response to a referral sent by Mr. Coty Larsh [facility] PCP, Lavera Guise, MD.   Mr. Sauceda was given information about Chronic Care Management services today including:  1. CCM service includes personalized support from designated clinical staff supervised by his physician, including individualized plan of care and coordination with other care providers 2. 24/7 contact phone numbers for assistance for urgent and routine care needs. 3. Service will only be billed when office clinical staff spend 20 minutes or more in a month to coordinate care. 4. Only one practitioner may furnish and bill the service in a calendar month. 5. The patient may stop CCM services at any time (effective at the end of the month) by phone call to the office staff.   Patient agreed to services and verbal consent obtained.   Follow up plan:   Tatjana Secretary/administrator

## 2020-12-25 NOTE — Chronic Care Management (AMB) (Signed)
  Chronic Care Management   Outreach Note  12/25/2020 Name: MANJOT HINKS MRN: [id number] DOB: [birth year]  Referred by: Lavera Guise, MD Reason for referral : No chief complaint on file.   A second unsuccessful telephone outreach was attempted today. The patient was referred to pharmacist for assistance with care management and care coordination.  Follow Up Plan:   Tatjana Dellinger [employer] scheduler

## 2021-01-05 ENCOUNTER — Other Ambulatory Visit: Payer: Self-pay | Admitting: Internal Medicine

## 2021-01-05 DIAGNOSIS — G4733 Obstructive sleep apnea (adult) (pediatric): Secondary | ICD-10-CM

## 2021-01-12 ENCOUNTER — Other Ambulatory Visit: Payer: Self-pay | Admitting: Adult Health

## 2021-01-12 DIAGNOSIS — K219 Gastro-esophageal reflux disease without esophagitis: Secondary | ICD-10-CM

## 2021-01-13 ENCOUNTER — Other Ambulatory Visit: Payer: Self-pay | Admitting: Physician Assistant

## 2021-01-13 ENCOUNTER — Other Ambulatory Visit: Payer: Self-pay

## 2021-01-13 DIAGNOSIS — K219 Gastro-esophageal reflux disease without esophagitis: Secondary | ICD-10-CM

## 2021-01-13 DIAGNOSIS — F411 Generalized anxiety disorder: Secondary | ICD-10-CM

## 2021-01-13 MED ORDER — ALPRAZOLAM 0.5 MG PO TABS
ORAL_TABLET | ORAL | 1 refills | Status: DC
Start: 2021-01-13 — End: 2021-03-31

## 2021-01-13 MED ORDER — OMEPRAZOLE 40 MG PO CPDR: 1.0000 | DELAYED_RELEASE_CAPSULE | Freq: Every day | ORAL | 1 refills | Status: DC

## 2021-01-24 ENCOUNTER — Ambulatory Visit (INDEPENDENT_AMBULATORY_CARE_PROVIDER_SITE_OTHER): Payer: Medicare HMO | Admitting: Physician Assistant

## 2021-01-24 ENCOUNTER — Encounter: Payer: Self-pay | Admitting: Physician Assistant

## 2021-01-24 DIAGNOSIS — Z79899 Other long term (current) drug therapy: Secondary | ICD-10-CM

## 2021-01-24 DIAGNOSIS — I10 Essential (primary) hypertension: Secondary | ICD-10-CM | POA: Diagnosis not present

## 2021-01-24 DIAGNOSIS — E669 Obesity, unspecified: Secondary | ICD-10-CM

## 2021-01-24 DIAGNOSIS — G8928 Other chronic postprocedural pain: Secondary | ICD-10-CM

## 2021-01-24 DIAGNOSIS — E782 Mixed hyperlipidemia: Secondary | ICD-10-CM

## 2021-01-24 DIAGNOSIS — F411 Generalized anxiety disorder: Secondary | ICD-10-CM

## 2021-01-24 DIAGNOSIS — F17210 Nicotine dependence, cigarettes, uncomplicated: Secondary | ICD-10-CM | POA: Diagnosis not present

## 2021-01-24 LAB — POCT URINE DRUG SCREEN
Methylenedioxyamphetamine: NOT DETECTED
POC Amphetamine UR: NOT DETECTED
POC BENZODIAZEPINES UR: NOT DETECTED
POC Barbiturate UR: NOT DETECTED
POC Cocaine UR: NOT DETECTED
POC Ecstasy UR: NOT DETECTED
POC Marijuana UR: POSITIVE — AB
POC Methadone UR: NOT DETECTED
POC Methamphetamine UR: NOT DETECTED
POC Opiate Ur: NOT DETECTED
POC Oxycodone UR: NOT DETECTED
POC PHENCYCLIDINE UR: NOT DETECTED
POC TRICYCLICS UR: POSITIVE — AB

## 2021-01-24 MED ORDER — BUPROPION HCL ER (XL) 150 MG PO TB24: ORAL_TABLET | ORAL | 1 refills | Status: DC

## 2021-01-24 MED ORDER — TRAMADOL HCL 50 MG PO TABS: ORAL_TABLET | ORAL | 1 refills | Status: DC

## 2021-01-24 MED ORDER — AMLODIPINE BESYLATE 5 MG PO TABS: 5.0000 mg | ORAL_TABLET | Freq: Every day | ORAL | 3 refills | Status: DC

## 2021-01-24 NOTE — Progress Notes (Signed)
[facility] [address] [place], [place] [address]  Internal MEDICINE  Office Visit Note  Patient Name: Zachary Holmes  [id number]  [id number]  Date of Service: 01/24/2021  Chief Complaint  Patient presents with   Follow-up    Discuss meds and refills   Depression   Gastroesophageal Reflux   Anxiety   Hypertension   Quality Metric Gaps    Pneumovax, shingrix, covid booster    HPI  Pt is here for routine follow-up and has no complaints today. -Does not check blood pressure at home however blood pressure is improved in office today.  Patient is taking 5 mg of amlodipine daily.  He does admit that he is not taking his atorvastatin which was prescribed last visit.  Patient states he is not able to tolerate this and is not willing to continue on a statin. -Patient is continuing on Wellbutrin to try to help with smoking cessation however he states that he is not ready to fully quit at this time.  He is still smoking about half a pack a day which is an improvement.  He is also using this to help with his anxiety and is only taking Xanax as needed. -He continues to take tramadol as needed for chronic pain, he reports he is usually only taking a few times a week but will take more if needed - Patient continues to take omeprazole for acid reflux and states that this is well controlled with the medication - Patient is requesting refills today of everything except for Xanax which was refilled last week  Current Medication: Outpatient Encounter Medications as of 01/24/2021  Medication Sig   ALPRAZolam (XANAX) 0.5 MG tablet Take one tab a day prn for anxiety   cyclobenzaprine (FLEXERIL) 10 MG tablet TAKE 1 TABLET BY MOUTH EVERY NIGHT AT BEDTIME FOR MUSCLE SPASM   omeprazole (PRILOSEC) 40 MG capsule Take 1 capsule (40 mg total) by mouth daily.   [DISCONTINUED] buPROPion (WELLBUTRIN XL) 150 MG 24 hr tablet TAKE ONE TAB PO QD IN AM  FOR ANXIETY   [DISCONTINUED] traMADol (ULTRAM) 50  MG tablet Take one tab po bid prn for pain   amLODipine (NORVASC) 5 MG tablet Take 1 tablet (5 mg total) by mouth daily.   buPROPion (WELLBUTRIN XL) 150 MG 24 hr tablet TAKE ONE TAB PO QD IN AM  FOR ANXIETY   traMADol (ULTRAM) 50 MG tablet Take one tab po bid prn for pain   [DISCONTINUED] amLODipine (NORVASC) 5 MG tablet Take 1 tablet (5 mg total) by mouth daily. (Patient not taking: Reported on 01/24/2021)   [DISCONTINUED] atorvastatin (LIPITOR) 10 MG tablet Take 1 tablet (10 mg total) by mouth daily. (Patient not taking: Reported on 01/24/2021)   [DISCONTINUED] nicotine (NICODERM CQ - DOSED IN MG/24 HOURS) 21 mg/24hr patch Place 1 patch (21 mg total) onto the skin daily. (Patient not taking: Reported on 01/24/2021)   [DISCONTINUED] nicotine (NICODERM CQ) 21 mg/24hr patch Place 1 patch (21 mg total) onto the skin daily. For smoking cessation (family hx of Alpha1 antitrypsin def) (Patient not taking: Reported on 01/24/2021)   No facility-administered encounter medications on file as of 01/24/2021.    Surgical History: Past Surgical History:  Procedure Laterality Date   ANKLE FUSION Right 11/24/2007   Archie Endo 12/04/2010   COLONOSCOPY WITH PROPOFOL N/A 05/13/2017   Procedure: COLONOSCOPY WITH PROPOFOL;  Surgeon: Lucilla Lame, MD;  Location: [facility];  Service: Endoscopy;  Laterality: N/A;  requests early   FRACTURE  SURGERY     JOINT REPLACEMENT     KNEE ARTHROCENTESIS Left X 5   "got infected . . ."   MULTIPLE TOOTH EXTRACTIONS     SHOULDER ARTHROSCOPY Left 06/2007   /notes 12/03/2010   TOTAL KNEE ARTHROPLASTY Left 2008   /notes 04/09/2010   TOTAL SHOULDER ARTHROPLASTY Left 09/20/2014   TOTAL SHOULDER ARTHROPLASTY Left 09/20/2014   Procedure: TOTAL SHOULDER ARTHROPLASTY;  Surgeon: Nita Sells, MD;  Location: [facility];  Service: Orthopedics;  Laterality: Left;  Left total shoulder arthroplasty   TOTAL SHOULDER ARTHROPLASTY Right 06/02/2018   Procedure: RIGHT TOTAL SHOULDER  ARTHROPLASTY;  Surgeon: Tania Ade, MD;  Location: [facility];  Service: Orthopedics;  Laterality: Right;    Medical History: Past Medical History:  Diagnosis Date   Anxiety    Arthritis    "legs" (09/20/2014)   Depression    GERD (gastroesophageal reflux disease)    Hypertension    OA (osteoarthritis) of shoulder    right   Seizures (HCC) 1983   x1, S/P head injury   Serratia marcescens infection 2011   left knee   Umbilical hernia    Wears dentures    partial upper    Family History: Family History  Problem Relation Age of Onset   COPD Mother    Heart disease Father     Social History   Socioeconomic History   Marital status: Single    Spouse name: Not on file   Number of children: Not on file   Years of education: Not on file   Highest education level: Not on file  Occupational History   Not on file  Tobacco Use   Smoking status: Every Day    Packs/day: 0.50    Years: 20.00    Pack years: 10.00    Types: Cigarettes   Smokeless tobacco: Never   Tobacco comments:    pcp PCP had given pt patch.  needs referal from PCP for insurance to pay  Vaping Use   Vaping Use: Never used  Substance and Sexual Activity   Alcohol use: Not Currently    Alcohol/week: 12.0 standard drinks    Types: 12 Cans of beer per week    Comment: SOBER SINCE 05/17/2017   Drug use: Yes    Types: Marijuana    Comment: DAILY   Sexual activity: Not Currently  Other Topics Concern   Not on file  Social History Narrative   Not on file   Social Determinants of Health   Financial Resource Strain: Not on file  Food Insecurity: Not on file  Transportation Needs: Not on file  Physical Activity: Not on file  Stress: Not on file  Social Connections: Not on file  Intimate Partner Violence: Not on file      Review of Systems  Constitutional:  Negative for chills, fatigue and unexpected weight change.  HENT:  Negative for congestion, postnasal drip, rhinorrhea, sneezing and sore  throat.   Eyes:  Negative for redness.  Respiratory:  Negative for cough, chest tightness and shortness of breath.   Cardiovascular:  Negative for chest pain and palpitations.  Gastrointestinal:  Negative for abdominal pain, constipation, diarrhea, nausea and vomiting.  Genitourinary:  Negative for dysuria and frequency.  Musculoskeletal:  Positive for arthralgias, back pain and myalgias. Negative for joint swelling and neck pain.  Skin:  Negative for rash.  Neurological: Negative.  Negative for tremors and numbness.  Hematological:  Negative for adenopathy. Does not bruise/bleed easily.  Psychiatric/Behavioral:  Negative for behavioral problems (Depression), sleep disturbance and suicidal ideas. The patient is nervous/anxious.    Vital Signs: BP 126/90   Pulse 90   Temp 97.6 F (36.4 C)   Resp 16   Ht 6' (1.829 m)   Wt 265 lb 12.8 oz (120.6 kg)   SpO2 96%   BMI 36.05 kg/m    Physical Exam Vitals and nursing note reviewed.  Constitutional:      General: He is not in acute distress.    Appearance: He is well-developed. He is obese. He is not diaphoretic.  HENT:     Head: Normocephalic and atraumatic.     Mouth/Throat:     Pharynx: No oropharyngeal exudate.  Eyes:     Pupils: Pupils are equal, round, and reactive to light.  Neck:     Thyroid: No thyromegaly.     Vascular: No JVD.     Trachea: No tracheal deviation.  Cardiovascular:     Rate and Rhythm: Normal rate and regular rhythm.     Heart sounds: Normal heart sounds. No murmur heard.   No friction rub. No gallop.  Pulmonary:     Effort: Pulmonary effort is normal. No respiratory distress.     Breath sounds: No wheezing or rales.  Chest:     Chest wall: No tenderness.  Abdominal:     General: Bowel sounds are normal.     Palpations: Abdomen is soft.  Musculoskeletal:        General: Normal range of motion.     Cervical back: Normal range of motion and neck supple.     Comments: Chronic pain throughout legs,  especially knees and back with movement  Lymphadenopathy:     Cervical: No cervical adenopathy.  Skin:    General: Skin is warm and dry.  Neurological:     Mental Status: He is alert and oriented to person, place, and time.     Cranial Nerves: No cranial nerve deficit.  Psychiatric:        Behavior: Behavior normal.        Thought Content: Thought content normal.        Judgment: Judgment normal.       Assessment/Plan: 1. GAD (generalized anxiety disorder) Continue Wellbutrin daily, and Xanax only as needed - buPROPion (WELLBUTRIN XL) 150 MG 24 hr tablet; TAKE ONE TAB PO QD IN AM  FOR ANXIETY  Dispense: 90 tablet; Refill: 1  2. Essential hypertension, benign Stable, continue Norvasc - amLODipine (NORVASC) 5 MG tablet; Take 1 tablet (5 mg total) by mouth daily.  Dispense: 90 tablet; Refill: 3  3. Mixed hyperlipidemia Patient stopped taking atorvastatin prescribed at last visit, unwilling to retry any other statins.  Will need to work on diet and exercise in order to control cholesterol.  May need to discuss alternative if not improving  4. Other chronic postprocedural pain May continue take tramadol as needed - traMADol (ULTRAM) 50 MG tablet; Take one tab po bid prn for pain  Dispense: 60 tablet; Refill: 1  5. Cigarette nicotine dependence without complication Continue Wellbutrin to aid with smoking cessation as well as anxiety.  Patient is down to half a pack a day and is working on slowly scaling back however is not ready to quit completely  6. Obesity (BMI 35.0-39.9 without comorbidity) Continue Wellbutrin for anxiety and smoking cessation as well as added benefit of potential weight loss Obesity Counseling: Had a lengthy discussion regarding patients BMI and weight issues. Patient was instructed on  portion control as well as increased activity. Also discussed caloric restrictions with trying to maintain intake less than 2000 Kcal. Discussions were made in accordance with the  5As of weight management. Simple actions such as not eating late and if able to, taking a walk is suggested.  7. Encounter for long-term (current) use of high-risk medication - POCT Urine Drug Screen   General Counseling: Trev verbalizes understanding of the findings of todays visit and agrees with plan of treatment. I have discussed any further diagnostic evaluation that may be needed or ordered today. We also reviewed his medications today. he has been encouraged to call the office with any questions or concerns that should arise related to todays visit.    Orders Placed This Encounter  Procedures   POCT Urine Drug Screen    Meds ordered this encounter  Medications   traMADol (ULTRAM) 50 MG tablet    Sig: Take one tab po bid prn for pain    Dispense:  60 tablet    Refill:  1   buPROPion (WELLBUTRIN XL) 150 MG 24 hr tablet    Sig: TAKE ONE TAB PO QD IN AM  FOR ANXIETY    Dispense:  90 tablet    Refill:  1   amLODipine (NORVASC) 5 MG tablet    Sig: Take 1 tablet (5 mg total) by mouth daily.    Dispense:  90 tablet    Refill:  3    This patient was seen by Drema Dallas, PA-C in collaboration with Dr. Clayborn Bigness as a part of collaborative care agreement.   Total time spent:30 Minutes Time spent includes review of chart, medications, test results, and follow up plan with the patient.      Dr Lavera Guise Internal medicine

## 2021-02-12 ENCOUNTER — Ambulatory Visit: Payer: Medicare HMO

## 2021-02-22 DIAGNOSIS — Z20822 Contact with and (suspected) exposure to covid-19: Secondary | ICD-10-CM | POA: Diagnosis not present

## 2021-03-30 ENCOUNTER — Other Ambulatory Visit: Payer: Self-pay | Admitting: Physician Assistant

## 2021-03-30 DIAGNOSIS — F411 Generalized anxiety disorder: Secondary | ICD-10-CM

## 2021-03-30 NOTE — Telephone Encounter (Signed)
Please review and refill if appropriate LOV: 01/24/21 NOV: 04/25/21

## 2021-04-03 ENCOUNTER — Telehealth: Payer: Self-pay | Admitting: Pharmacist

## 2021-04-03 NOTE — Progress Notes (Signed)
Chronic Care Management Pharmacy Note  04/04/2021 Name:  BEXTON HAAK MRN:  [id number] DOB:  [birth year]  Summary: Initial visit with PharmD.  Meds reviewed and updated.  Patient reports not tolerating his statin with elevated LDL.  Recommendations/Changes made from today's visit: Consider Zetia to replace statin as ASCVD is high risk at 38.9 - could also consider something like Repatha  Plan: FU 6 months    Subjective: LESS WOOLSEY is an 60 y.o. year old male who is a primary patient of Humphrey Rolls, Timoteo Gaul, MD.  The CCM team was consulted for assistance with disease management and care coordination needs.    Engaged with patient face to face for initial visit in response to provider referral for pharmacy case management and/or care coordination services.   Consent to Services:  The patient was given the following information about Chronic Care Management services today, agreed to services, and gave verbal consent: 1. CCM service includes personalized support from designated clinical staff supervised by the primary care provider, including individualized plan of care and coordination with other care providers 2. 24/7 contact phone numbers for assistance for urgent and routine care needs. 3. Service will only be billed when office clinical staff spend 20 minutes or more in a month to coordinate care. 4. Only one practitioner may furnish and bill the service in a calendar month. 5.The patient may stop CCM services at any time (effective at the end of the month) by phone call to the office staff. 6. The patient will be responsible for cost sharing (co-pay) of up to 20% of the service fee (after annual deductible is met). Patient agreed to services and consent obtained.  Patient Care Team: Lavera Guise, MD as PCP - General (Internal Medicine) Edythe Clarity, [facility] as Pharmacist (Pharmacist) Recent office visits:  01/24/21 Pulmonology McDonough, Si Gaul, PA-C. For follow-up. No medication  changes.  10/24/20 Pulmonology McDonough, Si Gaul, PA-C. For follow-up. STARTED Atorvastatin 10 mg daily.     Recent consult visits:  None in the last six months.    Hospital visits:  None in previous 6 months   Objective:  Lab Results  Component Value Date   CREATININE 1.08 07/09/2020   BUN 17 07/09/2020   GFRNONAA 76 07/09/2020   GFRAA 88 07/09/2020   NA 137 07/09/2020   K 4.2 07/09/2020   CALCIUM 9.3 07/09/2020   CO2 22 07/09/2020   GLUCOSE 131 (H) 07/09/2020    Lab Results  Component Value Date/Time   HGBA1C 5.8 (A) 08/22/2020 09:29 AM   HGBA1C 5.8 (A) 02/29/2020 10:09 AM   HGBA1C 5.8 (H) 06/01/2019 11:38 AM    Last diabetic Eye exam: No results found for: HMDIABEYEEXA  Last diabetic Foot exam: No results found for: HMDIABFOOTEX   Lab Results  Component Value Date   CHOL 237 (H) 07/09/2020   HDL 32 (L) 07/09/2020   LDLCALC 168 (H) 07/09/2020   TRIG 198 (H) 07/09/2020    Hepatic Function Latest Ref Rng & Units 07/09/2020 06/01/2019 05/25/2018  Total Protein 6.0 - 8.5 g/dL 7.2 7.0 7.0  Albumin 3.8 - 4.9 g/dL 4.3 4.5 3.8  AST 0 - 40 IU/L _0 ALT 0 - 44 IU/L _1 Alk Phosphatase 44 - 121 IU/L 95 89 69  Total Bilirubin 0.0 - 1.2 mg/dL 0.6 0.3 0.7    Lab Results  Component Value Date/Time   TSH 2.250 07/09/2020 10:19 AM   TSH 3.010 06/01/2019 11:38 AM  FREET4 1.03 07/09/2020 10:19 AM   FREET4 0.89 06/01/2019 11:38 AM    CBC Latest Ref Rng & Units 07/09/2020 06/01/2019 06/03/2018  WBC 3.4 - 10.8 x10E3/uL 11.0(H) 7.7 20.8(H)  Hemoglobin 13.0 - 17.7 g/dL 16.5 15.6 13.9  Hematocrit 37.5 - 51.0 % 48.1 46.1 43.2  Platelets 150 - 450 x10E3/uL 274 271 232    No results found for: VD25OH  Clinical ASCVD: No  The 10-year ASCVD risk score Mikey Bussing DC Jr., et al., 2013) is: 38.9%   Values used to calculate the score:     Age: 36 years     Sex: Male     Is Non-Hispanic African American: No     Diabetic: Yes     Tobacco smoker: Yes     Systolic Blood  Pressure: 126 mmHg     Is BP treated: Yes     HDL Cholesterol: 32 mg/dL     Total Cholesterol: 237 mg/dL    Depression screen [facility] 2/9 08/22/2020 07/08/2020 06/03/2020  Decreased Interest 0 2 1  Down, Depressed, Hopeless 0 2 0  PHQ - 2 Score 0 4 1  Altered sleeping - - -  Tired, decreased energy - - -  Change in appetite - - -  Feeling bad or failure about yourself  - - -  Trouble concentrating - - -  Moving slowly or fidgety/restless - - -  Suicidal thoughts - - -  PHQ-9 Score - - -  Difficult doing work/chores - - -     Social History   Tobacco Use  Smoking Status Every Day   Packs/day: 0.50   Years: 20.00   Pack years: 10.00   Types: Cigarettes  Smokeless Tobacco Never  Tobacco Comments   pcp PCP had given pt patch.  needs referal from PCP for insurance to pay   BP Readings from Last 3 Encounters:  01/24/21 126/90  10/24/20 (!) 150/90  08/22/20 (!) 145/92   Pulse Readings from Last 3 Encounters:  01/24/21 90  10/24/20 96  08/22/20 86   Wt Readings from Last 3 Encounters:  01/24/21 265 lb 12.8 oz (120.6 kg)  10/24/20 271 lb (122.9 kg)  08/22/20 274 lb 9.6 oz (124.6 kg)   BMI Readings from Last 3 Encounters:  01/24/21 36.05 kg/m  10/24/20 36.75 kg/m  08/22/20 36.23 kg/m    Assessment/Interventions: Review of patient past medical history, allergies, medications, health status, including review of consultants reports, laboratory and other test data, was performed as part of comprehensive evaluation and provision of chronic care management services.   SDOH:  (Social Determinants of Health) assessments and interventions performed: Yes  Financial Resource Strain: Low Risk    Difficulty of Paying Living Expenses: Not very hard    SDOH Screenings   Alcohol Screen: Not on file  Depression (PHQ2-9): Low Risk    PHQ-2 Score: 0  Financial Resource Strain: Low Risk    Difficulty of Paying Living Expenses: Not very hard  Food Insecurity: Not on file  Housing:  Not on file  Physical Activity: Not on file  Social Connections: Not on file  Stress: Not on file  Tobacco Use: High Risk   Smoking Tobacco Use: Every Day   Smokeless Tobacco Use: Never  Transportation Needs: Not on file    [place]  No Known Allergies  Medications Reviewed Today     Reviewed by Edythe Clarity, [facility] (Pharmacist) on 04/04/21 at [place] List Status: <None>   Medication Order Taking?  Sig Documenting Provider Last Dose Status Informant  ALPRAZolam (XANAX) 0.5 MG tablet [id number] Yes TAKE 1 TABLET BY MOUTH EVERY DAY AS NEEDED FOR ANXIETY Lavera Guise, MD Taking Active   amLODipine (NORVASC) 5 MG tablet [id number] Yes Take 1 tablet (5 mg total) by mouth daily. [name], PA-C Taking Active   buPROPion (WELLBUTRIN XL) 150 MG 24 hr tablet [id number] Yes TAKE ONE TAB PO QD IN AM  FOR ANXIETY McDonough, Lauren K, PA-C Taking Active   cyclobenzaprine (FLEXERIL) 10 MG tablet [id number] Yes TAKE 1 TABLET BY MOUTH EVERY NIGHT AT BEDTIME FOR MUSCLE SPASM Lavera Guise, MD Taking Active   omeprazole (PRILOSEC) 40 MG capsule [id number] Yes Take 1 capsule (40 mg total) by mouth daily. Lavera Guise, MD Taking Active   traMADol Veatrice Bourbon) 50 MG tablet [id number] Yes Take one tab po bid prn for pain McDonough, Si Gaul, PA-C Taking Active   Med List Note Landis Martins, RN 02/27/15 0940): UDS done 02-27-15 New Patient            Patient Active Problem List   Diagnosis Date Noted   Other dysphagia 05/30/2019   Loud snoring 05/30/2019   Neoplasm of uncertain behavior of skin of lower extremity 05/30/2019   Encounter for long-term (current) use of medications 05/30/2019   S/P shoulder replacement, right 06/02/2018   Encounter for general adult medical examination with abnormal findings 05/27/2018   Dysuria 05/27/2018   Gastroesophageal reflux disease without esophagitis 02/16/2018   Chronic right shoulder pain 02/16/2018   Cramp and spasm 02/16/2018   Chronic  osteoarthritis 02/16/2018   GAD (generalized anxiety disorder) 02/16/2018   Hx of colonic polyps    Benign neoplasm of ascending colon    Benign neoplasm of transverse colon    Osteoarthritis of left shoulder 09/20/2014   Essential hypertension 04/16/2010   KNEE JOINT REPLACEMENT BY OTHER MEANS 04/16/2010   OTH INFS INVOLVING BONE DZ CLASS ELSW LOWER LEG 04/01/2010    Immunization History  Administered Date(s) Administered   Janssen (J&J) SARS-COV-2 Vaccination 11/12/2019    Conditions to be addressed/monitored:  HTN, GERD, Osteoarthritis, GAD, HLD  Care Plan : General Pharmacy (Adult)  Updates made by Edythe Clarity, RPH since 04/04/2021 12:00 AM     Problem: HTN, GERD, Osteoarthritis, GAD, HLD   Priority: High  Onset Date: 04/04/2021     Long-Range Goal: Patient-Specific Goal   Start Date: 04/04/2021  Expected End Date: 10/02/2021  This Visit's Progress: On track  Priority: High  Note:   Current Barriers:  Elevated Ldl cholesterol  Pharmacist Clinical Goal(s):  Patient will achieve control of LDL cholesterol as evidenced by labs through collaboration with PharmD and provider.   Interventions: 1:1 collaboration with Lavera Guise, MD regarding development and update of comprehensive plan of care as evidenced by provider attestation and co-signature Inter-disciplinary care team collaboration (see longitudinal plan of care) Comprehensive medication review performed; medication list updated in electronic medical record  Hypertension (BP goal <140/90) -Controlled -Current treatment: Amlodipine 30m daily -Medications previously tried: lisinopril, bisoprolol/HCTZ  -Current home readings: not checking at home -Current dietary habits: love to eat little debbies, [place] loves pasta, does not eat lots of bread -Current exercise habits: very active lifestyle walks his dog a few miles per day -Denies hypotensive/hypertensive symptoms -Educated on BP goals and  benefits of medications for prevention of heart attack, stroke and kidney damage; Importance of home blood pressure monitoring; Symptoms of hypotension and importance of maintaining  adequate hydration; -Counseled to monitor BP at home as able, document, and provide log at future appointments -Recommended to continue current medication  Hyperlipidemia: (LDL goal < 100) -Uncontrolled -Current treatment: None at this time -Medications previously tried: statins  -Current dietary patterns: see HTN -Current exercise habits: see HTN -Educated on Cholesterol goals;  Benefits of statin for ASCVD risk reduction; Importance of limiting foods high in cholesterol; Other alternatives for treatment of high cholesterol -Recommended to continue current medication Counseled on importance of lowering cholesterol to reduce risk of stroke and heart attack Consider addition of zetia in place of statins to lower cholesterol Future plans could be Repatha if covered by insurance and Zetia does not get patient to goal  Depression/Anxiety (Goal: Minimize symptoms) -Controlled -Current treatment: Wellbutrin XL 132m qam Alprazolam 0.571mdaily prn anxiety -Medications previously tried/failed: none noted -PHQ9:  PHQ9 SCORE ONLY 08/22/2020 07/08/2020 06/03/2020  PHQ-9 Total Score 0 4 1   -GAD7: No flowsheet data found.  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  Shattered almost every bone from feet up in his body.  This has led to numerous surgeries and chronic pain.  Reports pain is controlled enough to where he can bare it. -Recommended to continue current medication  Patient Goals/Self-Care Activities Patient will:  - take medications as prescribed check blood pressure as able, document, and provide at future appointments Work to limit content of sugars and carbs in diet  Follow Up Plan: The care management team will reach out to the patient again over the next 180 days.         Medication Assistance: None required.  Patient affirms current coverage meets needs.  Compliance/Adherence/Medication fill history: Care Gaps: Urine microalbumin needed  Star-Rating Drugs: none  Patient's preferred pharmacy is:  [facility] [place] [address] [id number]: [phone] [place]: [phone] - [place] [id number]: [phone] pill box? No - takes out of vials Pt endorses 100% compliance  We discussed: Current pharmacy is preferred with insurance plan and patient is satisfied with pharmacy services Patient decided to: Continue current medication management strategy  Care Plan and Follow Up Patient Decision:  Patient agrees to Care Plan and Follow-up.  Plan: The care management team will reach out to the patient again over the next 180 days.  ChrisBeverly MilchrmD Clinical Pharmacist [phone]

## 2021-04-03 NOTE — Progress Notes (Addendum)
  Chronic Care Management Pharmacy Assistant   Name: [name]  MRN: [id number] DOB: 05-05-1961  Zachary Holmes is an 60 y.o. year old male who presents for his initial CCM visit with the clinical pharmacist.  Reason for Encounter: Chart Prep    Conditions to be addressed/monitored: HTN, GERD, Anxiety  Primary concerns for visit include: HTN  Recent office visits:  01/24/21 Pulmonology McDonough, Si Gaul, PA-C. For follow-up. No medication changes.  10/24/20 Pulmonology McDonough, Si Gaul, PA-C. For follow-up. STARTED Atorvastatin 10 mg daily.    Recent consult visits:  None in the last six months.   Hospital visits:  None in previous 6 months  Medications: Outpatient Encounter Medications as of 04/03/2021  Medication Sig   ALPRAZolam (XANAX) 0.5 MG tablet TAKE 1 TABLET BY MOUTH EVERY DAY AS NEEDED FOR ANXIETY   amLODipine (NORVASC) 5 MG tablet Take 1 tablet (5 mg total) by mouth daily.   buPROPion (WELLBUTRIN XL) 150 MG 24 hr tablet TAKE ONE TAB PO QD IN AM  FOR ANXIETY   cyclobenzaprine (FLEXERIL) 10 MG tablet TAKE 1 TABLET BY MOUTH EVERY NIGHT AT BEDTIME FOR MUSCLE SPASM   omeprazole (PRILOSEC) 40 MG capsule Take 1 capsule (40 mg total) by mouth daily.   traMADol (ULTRAM) 50 MG tablet Take one tab po bid prn for pain   No facility-administered encounter medications on file as of 04/03/2021.    Have you seen any other providers since your last visit? **  Any changes in your medications or health?   Any side effects from any medications?   Do you have an symptoms or problems not managed by your medications?   Any concerns about your health right now?   Has your provider asked that you check blood pressure, blood sugar, or follow special diet at home?   Do you get any type of exercise on a regular basis?   Can you think of a goal you would like to reach for your health?   Do you have any problems getting your medications?   Is there anything that you would  like to discuss during the appointment?   Please bring medications and supplements to appointment.  Follow-Up:Pharmacist Review  Charlann Lange, [place] Pharmacist Assistant [phone]

## 2021-04-04 ENCOUNTER — Other Ambulatory Visit: Payer: Self-pay

## 2021-04-04 ENCOUNTER — Ambulatory Visit: Payer: Medicare HMO | Admitting: Pharmacist

## 2021-04-04 DIAGNOSIS — M1990 Unspecified osteoarthritis, unspecified site: Secondary | ICD-10-CM

## 2021-04-04 DIAGNOSIS — I10 Essential (primary) hypertension: Secondary | ICD-10-CM

## 2021-04-04 DIAGNOSIS — K219 Gastro-esophageal reflux disease without esophagitis: Secondary | ICD-10-CM

## 2021-04-04 DIAGNOSIS — E782 Mixed hyperlipidemia: Secondary | ICD-10-CM

## 2021-04-04 NOTE — Patient Instructions (Signed)
Visit Information   Goals Addressed   None    There are no care plans to display for this patient.   Zachary Holmes was given information about Chronic Care Management services today including:  CCM service includes personalized support from designated clinical staff supervised by his physician, including individualized plan of care and coordination with other care providers 24/7 contact phone numbers for assistance for urgent and routine care needs. Standard insurance, coinsurance, copays and deductibles apply for chronic care management only during months in which we provide at least 20 minutes of these services. Most insurances cover these services at 100%, however patients may be responsible for any copay, coinsurance and/or deductible if applicable. This service may help you avoid the need for more expensive face-to-face services. Only one practitioner may furnish and bill the service in a calendar month. The patient may stop CCM services at any time (effective at the end of the month) by phone call to the office staff.  Patient agreed to services and verbal consent obtained.   The patient verbalized understanding of instructions, educational materials, and care plan provided today and agreed to receive a mailed copy of patient instructions, educational materials, and care plan.  Telephone follow up appointment with pharmacy team member scheduled for: 6 months  Edythe Clarity, [place]

## 2021-04-14 ENCOUNTER — Telehealth: Payer: Self-pay | Admitting: Pharmacist

## 2021-04-14 NOTE — Progress Notes (Signed)
    Chronic Care Management Pharmacy Assistant   Name: JACADEN RAIMONDO  MRN: [id number] DOB: 10/12/1960  Reason for Encounter: CCM Care Plan  Medications: Outpatient Encounter Medications as of 04/14/2021  Medication Sig   ALPRAZolam (XANAX) 0.5 MG tablet TAKE 1 TABLET BY MOUTH EVERY DAY AS NEEDED FOR ANXIETY   amLODipine (NORVASC) 5 MG tablet Take 1 tablet (5 mg total) by mouth daily.   buPROPion (WELLBUTRIN XL) 150 MG 24 hr tablet TAKE ONE TAB PO QD IN AM  FOR ANXIETY   cyclobenzaprine (FLEXERIL) 10 MG tablet TAKE 1 TABLET BY MOUTH EVERY NIGHT AT BEDTIME FOR MUSCLE SPASM   omeprazole (PRILOSEC) 40 MG capsule Take 1 capsule (40 mg total) by mouth daily.   traMADol (ULTRAM) 50 MG tablet Take one tab po bid prn for pain   No facility-administered encounter medications on file as of 04/14/2021.   Reviewed the patients initial visit reinsured it was completed per the pharmacist Leata Mouse request. Printed the CCM care plan. Mailed the patient CCM care plan to their most recent address on file  [place], [place] Pharmacist Assistant [phone]

## 2021-04-25 ENCOUNTER — Encounter: Payer: Self-pay | Admitting: Physician Assistant

## 2021-04-25 ENCOUNTER — Other Ambulatory Visit: Payer: Self-pay

## 2021-04-25 ENCOUNTER — Ambulatory Visit (INDEPENDENT_AMBULATORY_CARE_PROVIDER_SITE_OTHER): Payer: Medicare HMO | Admitting: Physician Assistant

## 2021-04-25 DIAGNOSIS — I10 Essential (primary) hypertension: Secondary | ICD-10-CM | POA: Diagnosis not present

## 2021-04-25 DIAGNOSIS — F411 Generalized anxiety disorder: Secondary | ICD-10-CM | POA: Diagnosis not present

## 2021-04-25 DIAGNOSIS — R5383 Other fatigue: Secondary | ICD-10-CM | POA: Diagnosis not present

## 2021-04-25 DIAGNOSIS — E782 Mixed hyperlipidemia: Secondary | ICD-10-CM | POA: Diagnosis not present

## 2021-04-25 DIAGNOSIS — F17210 Nicotine dependence, cigarettes, uncomplicated: Secondary | ICD-10-CM | POA: Diagnosis not present

## 2021-04-25 DIAGNOSIS — G8928 Other chronic postprocedural pain: Secondary | ICD-10-CM

## 2021-04-25 DIAGNOSIS — Z23 Encounter for immunization: Secondary | ICD-10-CM

## 2021-04-25 MED ORDER — TETANUS-DIPHTH-ACELL PERTUSSIS 5-2.5-18.5 LF-MCG/0.5 IM SUSP: 0.5000 mL | Freq: Once | INTRAMUSCULAR | 0 refills | Status: AC

## 2021-04-25 MED ORDER — EZETIMIBE 10 MG PO TABS: 10.0000 mg | ORAL_TABLET | Freq: Every day | ORAL | 3 refills | Status: DC

## 2021-04-25 MED ORDER — ZOSTER VAC RECOMB ADJUVANTED 50 MCG/0.5ML IM SUSR: 0.5000 mL | Freq: Once | INTRAMUSCULAR | 0 refills | Status: AC

## 2021-04-25 NOTE — Progress Notes (Signed)
[facility] [place], [place] [address]  Internal MEDICINE  Office Visit Note  Patient Name: Zachary Holmes  [id number]  [id number]  Date of Service: 04/27/2021  Chief Complaint  Patient presents with   Anxiety   Hypertension    3 month follow up   disability parking form    HPI Pt is here for routine follow up -He is doing well without complaints today -He did bring [employer] disability parking placard form to be completed in office -Willing to try alternative cholesterol medication after visit with pharmacy.  Patient previously unable to tolerate statin and therefore will be tried on Zetia in addition to improving his diet and exercise - he is currently smoking about 1/2 ppd and continuing to wind down.  Patient successfully quit drinking alcohol several years ago and believes he can do the same with smoking but that it may take a little more time but has made progress. -Continues tramadol as needed for pain due to previous injuries, does not need a refill yet -Patient has lost 2 pounds since last visit -will have labs done prior to CPE next visit  Current Medication: Outpatient Encounter Medications as of 04/25/2021  Medication Sig   ALPRAZolam (XANAX) 0.5 MG tablet TAKE 1 TABLET BY MOUTH EVERY DAY AS NEEDED FOR ANXIETY   amLODipine (NORVASC) 5 MG tablet Take 1 tablet (5 mg total) by mouth daily.   buPROPion (WELLBUTRIN XL) 150 MG 24 hr tablet TAKE ONE TAB PO QD IN AM  FOR ANXIETY   cyclobenzaprine (FLEXERIL) 10 MG tablet TAKE 1 TABLET BY MOUTH EVERY NIGHT AT BEDTIME FOR MUSCLE SPASM   ezetimibe (ZETIA) 10 MG tablet Take 1 tablet (10 mg total) by mouth daily.   omeprazole (PRILOSEC) 40 MG capsule Take 1 capsule (40 mg total) by mouth daily.   [EXPIRED] Tdap (BOOSTRIX) 5-2.5-18.5 LF-MCG/0.5 injection Inject 0.5 mLs into the muscle once for 1 dose.   traMADol (ULTRAM) 50 MG tablet Take one tab po bid prn for pain   [EXPIRED] Zoster Vaccine Adjuvanted  [facility]) injection Inject 0.5 mLs into the muscle once for 1 dose.   No facility-administered encounter medications on file as of 04/25/2021.    Surgical History: Past Surgical History:  Procedure Laterality Date   ANKLE FUSION Right 11/24/2007   Archie Endo 12/04/2010   COLONOSCOPY WITH PROPOFOL N/A 05/13/2017   Procedure: COLONOSCOPY WITH PROPOFOL;  Surgeon: Lucilla Lame, MD;  Location: [place];  Service: Endoscopy;  Laterality: N/A;  requests early   FRACTURE SURGERY     JOINT REPLACEMENT     KNEE ARTHROCENTESIS Left X 5   "got infected . . ."   MULTIPLE TOOTH EXTRACTIONS     SHOULDER ARTHROSCOPY Left 06/2007   /notes 12/03/2010   TOTAL KNEE ARTHROPLASTY Left 2008   /notes 04/09/2010   TOTAL SHOULDER ARTHROPLASTY Left 09/20/2014   TOTAL SHOULDER ARTHROPLASTY Left 09/20/2014   Procedure: TOTAL SHOULDER ARTHROPLASTY;  Surgeon: Nita Sells, MD;  Location: [place];  Service: Orthopedics;  Laterality: Left;  Left total shoulder arthroplasty   TOTAL SHOULDER ARTHROPLASTY Right 06/02/2018   Procedure: RIGHT TOTAL SHOULDER ARTHROPLASTY;  Surgeon: Tania Ade, MD;  Location: [place];  Service: Orthopedics;  Laterality: Right;    Medical History: Past Medical History:  Diagnosis Date   Anxiety    Arthritis    "legs" (09/20/2014)   Depression    GERD (gastroesophageal reflux disease)    Hypertension    OA (osteoarthritis) of shoulder  right   Seizures ([place]) 1983   x1, S/P head injury   Serratia marcescens infection 2011   left knee   Umbilical hernia    Wears dentures    partial upper    Family History: Family History  Problem Relation Age of Onset   COPD Mother    Heart disease Father     Social History   Socioeconomic History   Marital status: Single    Spouse name: Not on file   Number of children: Not on file   Years of education: Not on file   Highest education level: Not on file  Occupational History   Not on file  Tobacco Use   Smoking  status: Every Day    Packs/day: 0.50    Years: 20.00    Pack years: 10.00    Types: Cigarettes   Smokeless tobacco: Never   Tobacco comments:    pcp PCP had given pt patch.  needs referal from PCP for insurance to pay  Vaping Use   Vaping Use: Never used  Substance and Sexual Activity   Alcohol use: Not Currently    Alcohol/week: 12.0 standard drinks    Types: 12 Cans of beer per week    Comment: SOBER SINCE 05/17/2017   Drug use: Yes    Types: Marijuana    Comment: DAILY   Sexual activity: Not Currently  Other Topics Concern   Not on file  Social History Narrative   Not on file   Social Determinants of Health   Financial Resource Strain: Low Risk    Difficulty of Paying Living Expenses: Not very hard  Food Insecurity: Not on file  Transportation Needs: Not on file  Physical Activity: Not on file  Stress: Not on file  Social Connections: Not on file  Intimate Partner Violence: Not on file      Review of Systems  Constitutional:  Negative for chills, fatigue and unexpected weight change.  HENT:  Negative for congestion, postnasal drip, rhinorrhea, sneezing and sore throat.   Eyes:  Negative for redness.  Respiratory:  Negative for cough, chest tightness and shortness of breath.   Cardiovascular:  Negative for chest pain and palpitations.  Gastrointestinal:  Negative for abdominal pain, constipation, diarrhea, nausea and vomiting.  Genitourinary:  Negative for dysuria and frequency.  Musculoskeletal:  Positive for arthralgias, back pain and myalgias. Negative for joint swelling and neck pain.  Skin:  Negative for rash.  Neurological: Negative.  Negative for tremors and numbness.  Hematological:  Negative for adenopathy. Does not bruise/bleed easily.  Psychiatric/Behavioral:  Negative for behavioral problems (Depression), sleep disturbance and suicidal ideas. The patient is nervous/anxious.    Vital Signs: BP 118/90   Pulse 84   Temp 98.2 F (36.8 C)   Resp 16    Ht 6' (1.829 m)   Wt 263 lb 12.8 oz (119.7 kg)   SpO2 95%   BMI 35.78 kg/m    Physical Exam Vitals and nursing note reviewed.  Constitutional:      General: He is not in acute distress.    Appearance: He is well-developed. He is obese. He is not diaphoretic.  HENT:     Head: Normocephalic and atraumatic.     Mouth/Throat:     Pharynx: No oropharyngeal exudate.  Eyes:     Pupils: Pupils are equal, round, and reactive to light.  Neck:     Thyroid: No thyromegaly.     Vascular: No JVD.     Trachea: No  tracheal deviation.  Cardiovascular:     Rate and Rhythm: Normal rate and regular rhythm.     Heart sounds: Normal heart sounds. No murmur heard.   No friction rub. No gallop.  Pulmonary:     Effort: Pulmonary effort is normal. No respiratory distress.     Breath sounds: No wheezing or rales.  Chest:     Chest wall: No tenderness.  Abdominal:     General: Bowel sounds are normal.     Palpations: Abdomen is soft.  Musculoskeletal:        General: Normal range of motion.     Cervical back: Normal range of motion and neck supple.     Comments: Chronic pain throughout legs, especially knees and back with movement  Lymphadenopathy:     Cervical: No cervical adenopathy.  Skin:    General: Skin is warm and dry.  Neurological:     Mental Status: He is alert and oriented to person, place, and time.     Cranial Nerves: No cranial nerve deficit.  Psychiatric:        Behavior: Behavior normal.        Thought Content: Thought content normal.        Judgment: Judgment normal.       Assessment/Plan: 1. Essential hypertension Stable, continue current medication  2. Mixed hyperlipidemia Patient previously did not tolerate statin therefore after pharmacy discussion we will trial Zetia instead - ezetimibe (ZETIA) 10 MG tablet; Take 1 tablet (10 mg total) by mouth daily.  Dispense: 90 tablet; Refill: 3 - Lipid Panel With LDL/HDL Ratio  3. Other chronic postprocedural pain May  continue tramadol as needed  4. Cigarette nicotine dependence without complication Patient is successfully cut down to half a pack per day and is working to cut down completely.  Is continuing on Wellbutrin to aid with anxiety as well as smoking cessation  5. GAD (generalized anxiety disorder) Continue Wellbutrin daily and Xanax as needed  6. Need for shingles vaccine - Zoster Vaccine Adjuvanted [facility]) injection; Inject 0.5 mLs into the muscle once for 1 dose.  Dispense: 0.5 mL; Refill: 0  7. Need for diphtheria-tetanus-pertussis (Tdap) vaccine - Tdap (BOOSTRIX) 5-2.5-18.5 LF-MCG/0.5 injection; Inject 0.5 mLs into the muscle once for 1 dose.  Dispense: 0.5 mL; Refill: 0  8. Other fatigue - CBC w/Diff/Platelet - Comprehensive metabolic panel - TSH + free T4   General Counseling: [place] verbalizes understanding of the findings of todays visit and agrees with plan of treatment. I have discussed any further diagnostic evaluation that may be needed or ordered today. We also reviewed his medications today. he has been encouraged to call the office with any questions or concerns that should arise related to todays visit.    Orders Placed This Encounter  Procedures   CBC w/Diff/Platelet   Comprehensive metabolic panel   TSH + free T4   Lipid Panel With LDL/HDL Ratio    Meds ordered this encounter  Medications   Zoster Vaccine Adjuvanted [facility]) injection    Sig: Inject 0.5 mLs into the muscle once for 1 dose.    Dispense:  0.5 mL    Refill:  0   Tdap (BOOSTRIX) 5-2.5-18.5 LF-MCG/0.5 injection    Sig: Inject 0.5 mLs into the muscle once for 1 dose.    Dispense:  0.5 mL    Refill:  0   ezetimibe (ZETIA) 10 MG tablet    Sig: Take 1 tablet (10 mg total) by mouth daily.  Dispense:  90 tablet    Refill:  3    This patient was seen by Drema Dallas, PA-C in collaboration with Dr. Clayborn Bigness as a part of collaborative care agreement.   Total time spent:30 Minutes Time  spent includes review of chart, medications, test results, and follow up plan with the patient.      Dr Lavera Guise Internal medicine

## 2021-05-02 DIAGNOSIS — K219 Gastro-esophageal reflux disease without esophagitis: Secondary | ICD-10-CM | POA: Diagnosis not present

## 2021-05-02 DIAGNOSIS — I10 Essential (primary) hypertension: Secondary | ICD-10-CM | POA: Diagnosis not present

## 2021-05-18 ENCOUNTER — Other Ambulatory Visit: Payer: Self-pay | Admitting: Physician Assistant

## 2021-05-18 DIAGNOSIS — G8928 Other chronic postprocedural pain: Secondary | ICD-10-CM

## 2021-06-13 ENCOUNTER — Telehealth: Payer: Self-pay | Admitting: Student-PharmD

## 2021-06-13 NOTE — Progress Notes (Addendum)
  Chronic Care Management Pharmacy Assistant   Name: Zachary Holmes  MRN: [id number] DOB: 05-01-1961  Reason for Encounter: General Disease State Call   Conditions to be addressed/monitored: HTN, GERD, Osteoarthritis, GAD, HLD  Recent office visits:  04/25/21 [name], PA-C. For follow-up. STARTED Ezetimibe 10 mg daily. GIVEN: BOOSTRIX and SHINGRIX.  Recent consult visits:  None since last CCM Call on 04/04/21  Hospital visits:  None since last CCM Call on 04/04/21  Medications: Outpatient Encounter Medications as of 06/13/2021  Medication Sig   ALPRAZolam (XANAX) 0.5 MG tablet TAKE 1 TABLET BY MOUTH EVERY DAY AS NEEDED FOR ANXIETY   amLODipine (NORVASC) 5 MG tablet Take 1 tablet (5 mg total) by mouth daily.   buPROPion (WELLBUTRIN XL) 150 MG 24 hr tablet TAKE ONE TAB PO QD IN AM  FOR ANXIETY   cyclobenzaprine (FLEXERIL) 10 MG tablet TAKE 1 TABLET BY MOUTH EVERY NIGHT AT BEDTIME FOR MUSCLE SPASM   ezetimibe (ZETIA) 10 MG tablet Take 1 tablet (10 mg total) by mouth daily.   omeprazole (PRILOSEC) 40 MG capsule Take 1 capsule (40 mg total) by mouth daily.   traMADol (ULTRAM) 50 MG tablet TAKE 1 TABLET BY MOUTH TWICE DAILY AS NEEDED FOR PAIN   No facility-administered encounter medications on file as of 06/13/2021.   GEN CALL: Patient stated he is very active and eats a healthy diet. He stated he has a lot pain from past medical concerns. He stated he believes that it may be the cause of his high blood pressure. He stated he takes his medications as prescribed. He stated he doesn't have any questions or concerns about his medications at this time. We discussed his follow-up appointments and reminded him to get his labs drawn.   Care Gaps:N/A.  Star Rating Drugs:N/A.  Follow-Up:Pharmacist Review  Charlann Lange, RMA Clinical Pharmacist Assistant [phone]  5 minutes spent in review, coordination, and documentation.  Reviewed by: Alena Bills,  PharmD Clinical Pharmacist [phone]

## 2021-06-24 DIAGNOSIS — E782 Mixed hyperlipidemia: Secondary | ICD-10-CM | POA: Diagnosis not present

## 2021-06-24 DIAGNOSIS — R5383 Other fatigue: Secondary | ICD-10-CM | POA: Diagnosis not present

## 2021-06-25 LAB — CBC WITH DIFFERENTIAL/PLATELET
Basophils Absolute: 0.1 10*3/uL (ref 0.0–0.2)
Basos: 1 %
EOS (ABSOLUTE): 0.3 10*3/uL (ref 0.0–0.4)
Eos: 4 %
Hematocrit: 47.5 % (ref 37.5–51.0)
Hemoglobin: 16 g/dL (ref 13.0–17.7)
Immature Grans (Abs): 0 10*3/uL (ref 0.0–0.1)
Immature Granulocytes: 0 %
Lymphocytes Absolute: 1.5 10*3/uL (ref 0.7–3.1)
Lymphs: 19 %
MCH: 29.8 pg (ref 26.6–33.0)
MCHC: 33.7 g/dL (ref 31.5–35.7)
MCV: 89 fL (ref 79–97)
Monocytes Absolute: 0.5 10*3/uL (ref 0.1–0.9)
Monocytes: 6 %
Neutrophils Absolute: 5.6 10*3/uL (ref 1.4–7.0)
Neutrophils: 70 %
Platelets: 292 10*3/uL (ref 150–450)
RBC: 5.37 x10E6/uL (ref 4.14–5.80)
RDW: 13.3 % (ref 11.6–15.4)
WBC: 8 10*3/uL (ref 3.4–10.8)

## 2021-06-25 LAB — LIPID PANEL WITH LDL/HDL RATIO
Cholesterol, Total: 194 mg/dL (ref 100–199)
HDL: 41 mg/dL (ref >39)
LDL Chol Calc (NIH): 134 mg/dL — ABNORMAL HIGH (ref 0–99)
LDL/HDL Ratio: 3.3 ratio (ref 0.0–3.6)
Triglycerides: 102 mg/dL (ref 0–149)
VLDL Cholesterol Cal: 19 mg/dL (ref 5–40)

## 2021-06-25 LAB — TSH+FREE T4
Free T4: 0.96 ng/dL (ref 0.82–1.77)
TSH: 2.12 u[IU]/mL (ref 0.450–4.500)

## 2021-06-25 LAB — COMPREHENSIVE METABOLIC PANEL
ALT: 17 IU/L (ref 0–44)
AST: 26 IU/L (ref 0–40)
Albumin/Globulin Ratio: 1.5 (ref 1.2–2.2)
Albumin: 4.3 g/dL (ref 3.8–4.9)
Alkaline Phosphatase: 107 IU/L (ref 44–121)
BUN/Creatinine Ratio: 12 (ref 9–20)
BUN: 13 mg/dL (ref 6–24)
Bilirubin Total: 0.5 mg/dL (ref 0.0–1.2)
CO2: 24 mmol/L (ref 20–29)
Calcium: 9.6 mg/dL (ref 8.7–10.2)
Chloride: 102 mmol/L (ref 96–106)
Creatinine, Ser: 1.1 mg/dL (ref 0.76–1.27)
Globulin, Total: 2.8 g/dL (ref 1.5–4.5)
Glucose: 129 mg/dL — ABNORMAL HIGH (ref 70–99)
Potassium: 4.4 mmol/L (ref 3.5–5.2)
Sodium: 140 mmol/L (ref 134–144)
Total Protein: 7.1 g/dL (ref 6.0–8.5)
eGFR: 78 mL/min/{1.73_m2} (ref >59)

## 2021-07-02 DIAGNOSIS — K219 Gastro-esophageal reflux disease without esophagitis: Secondary | ICD-10-CM

## 2021-07-02 DIAGNOSIS — I10 Essential (primary) hypertension: Secondary | ICD-10-CM

## 2021-07-07 ENCOUNTER — Other Ambulatory Visit: Payer: Self-pay | Admitting: Physician Assistant

## 2021-07-07 ENCOUNTER — Telehealth: Payer: Self-pay

## 2021-07-07 DIAGNOSIS — G8928 Other chronic postprocedural pain: Secondary | ICD-10-CM

## 2021-07-07 MED ORDER — TRAMADOL HCL 50 MG PO TABS: ORAL_TABLET | ORAL | 0 refills | Status: DC

## 2021-07-07 NOTE — Telephone Encounter (Signed)
Pt aware we send med

## 2021-07-10 ENCOUNTER — Ambulatory Visit: Payer: Medicare HMO | Admitting: Physician Assistant

## 2021-07-25 ENCOUNTER — Encounter: Payer: Self-pay | Admitting: Physician Assistant

## 2021-07-25 ENCOUNTER — Other Ambulatory Visit: Payer: Self-pay

## 2021-07-25 ENCOUNTER — Ambulatory Visit (INDEPENDENT_AMBULATORY_CARE_PROVIDER_SITE_OTHER): Payer: Medicare HMO | Admitting: Physician Assistant

## 2021-07-25 DIAGNOSIS — I10 Essential (primary) hypertension: Secondary | ICD-10-CM | POA: Diagnosis not present

## 2021-07-25 DIAGNOSIS — M62838 Other muscle spasm: Secondary | ICD-10-CM | POA: Diagnosis not present

## 2021-07-25 DIAGNOSIS — F411 Generalized anxiety disorder: Secondary | ICD-10-CM | POA: Diagnosis not present

## 2021-07-25 DIAGNOSIS — R7303 Prediabetes: Secondary | ICD-10-CM | POA: Diagnosis not present

## 2021-07-25 DIAGNOSIS — K219 Gastro-esophageal reflux disease without esophagitis: Secondary | ICD-10-CM

## 2021-07-25 DIAGNOSIS — Z1212 Encounter for screening for malignant neoplasm of rectum: Secondary | ICD-10-CM

## 2021-07-25 DIAGNOSIS — Z0001 Encounter for general adult medical examination with abnormal findings: Secondary | ICD-10-CM

## 2021-07-25 DIAGNOSIS — Z1211 Encounter for screening for malignant neoplasm of colon: Secondary | ICD-10-CM | POA: Diagnosis not present

## 2021-07-25 DIAGNOSIS — Z0100 Encounter for examination of eyes and vision without abnormal findings: Secondary | ICD-10-CM

## 2021-07-25 MED ORDER — CYCLOBENZAPRINE HCL 10 MG PO TABS: ORAL_TABLET | ORAL | 2 refills | Status: DC

## 2021-07-25 MED ORDER — AMLODIPINE BESYLATE 5 MG PO TABS: 5.0000 mg | ORAL_TABLET | Freq: Every day | ORAL | 3 refills | Status: DC

## 2021-07-25 MED ORDER — BUPROPION HCL ER (XL) 150 MG PO TB24: ORAL_TABLET | ORAL | 1 refills | Status: DC

## 2021-07-25 MED ORDER — OMEPRAZOLE 40 MG PO CPDR
40.0000 mg | DELAYED_RELEASE_CAPSULE | Freq: Every day | ORAL | 1 refills | Status: DC
Start: 2021-07-25 — End: 2021-07-25

## 2021-07-25 MED ORDER — OMEPRAZOLE 40 MG PO CPDR: 40.0000 mg | DELAYED_RELEASE_CAPSULE | Freq: Every day | ORAL | 1 refills | Status: DC

## 2021-07-25 NOTE — Progress Notes (Signed)
°[facility] °[address] °[place],  [address] ° °Internal MEDICINE  °Office Visit Note ° °Patient Name: Zachary Holmes ° 12/19/1960  °[id number] ° °Date of Service: 07/30/2021 ° °Chief Complaint  °Patient presents with  ° Medicare Wellness  ° Depression  ° Hypertension  ° Gastroesophageal Reflux  ° ° ° °HPI °Pt is here for routine health maintenance examination °-He is doing well and has no complaints today. °-He is in need of refills on his medications °-Pt received letter for Colonoscopy and upon looking at prior recommendation he is due to repeat and will be referred back to Dr. Wohl for this. °-Reviewed labs--cholesterol greatly improved though still elevated, and glucose high again. This has previously been discussed and pt tried on sample of steglatro but did not tolerate it. Advised that we need to check A1c in office for better monitoring and was found to be prediabetic. Will hold off on any meds right now since patient does not want any more meds added and discussed that we will check this routinely and can re-discuss meds in future if needed. °-Sleeping well ° °Current Medication: °Outpatient Encounter Medications as of 07/25/2021  °Medication Sig  ° ALPRAZolam (XANAX) 0.5 MG tablet TAKE 1 TABLET BY MOUTH EVERY DAY AS NEEDED FOR ANXIETY  ° ezetimibe (ZETIA) 10 MG tablet Take 1 tablet (10 mg total) by mouth daily.  ° traMADol (ULTRAM) 50 MG tablet TAKE 1 TABLET BY MOUTH TWICE DAILY AS NEEDED FOR PAIN  ° [DISCONTINUED] amLODipine (NORVASC) 5 MG tablet Take 1 tablet (5 mg total) by mouth daily.  ° [DISCONTINUED] buPROPion (WELLBUTRIN XL) 150 MG 24 hr tablet TAKE ONE TAB PO QD IN AM  FOR ANXIETY  ° [DISCONTINUED] cyclobenzaprine (FLEXERIL) 10 MG tablet TAKE 1 TABLET BY MOUTH EVERY NIGHT AT BEDTIME FOR MUSCLE SPASM  ° [DISCONTINUED] omeprazole (PRILOSEC) 40 MG capsule Take 1 capsule (40 mg total) by mouth daily.  ° amLODipine (NORVASC) 5 MG tablet Take 1 tablet (5 mg total) by mouth daily.   ° buPROPion (WELLBUTRIN XL) 150 MG 24 hr tablet TAKE ONE TAB PO QD IN AM  FOR ANXIETY  ° cyclobenzaprine (FLEXERIL) 10 MG tablet TAKE 1 TABLET BY MOUTH EVERY NIGHT AT BEDTIME FOR MUSCLE SPASM  ° omeprazole (PRILOSEC) 40 MG capsule Take 1 capsule (40 mg total) by mouth daily.  ° [DISCONTINUED] amLODipine (NORVASC) 5 MG tablet Take 1 tablet (5 mg total) by mouth daily.  ° [DISCONTINUED] amLODipine (NORVASC) 5 MG tablet Take 1 tablet (5 mg total) by mouth daily.  ° [DISCONTINUED] buPROPion (WELLBUTRIN XL) 150 MG 24 hr tablet TAKE ONE TAB PO QD IN AM  FOR ANXIETY  ° [DISCONTINUED] buPROPion (WELLBUTRIN XL) 150 MG 24 hr tablet TAKE ONE TAB PO QD IN AM  FOR ANXIETY  ° [DISCONTINUED] cyclobenzaprine (FLEXERIL) 10 MG tablet TAKE 1 TABLET BY MOUTH EVERY NIGHT AT BEDTIME FOR MUSCLE SPASM  ° [DISCONTINUED] cyclobenzaprine (FLEXERIL) 10 MG tablet TAKE 1 TABLET BY MOUTH EVERY NIGHT AT BEDTIME FOR MUSCLE SPASM  ° [DISCONTINUED] omeprazole (PRILOSEC) 40 MG capsule Take 1 capsule (40 mg total) by mouth daily.  ° [DISCONTINUED] omeprazole (PRILOSEC) 40 MG capsule Take 1 capsule (40 mg total) by mouth daily.  ° °No facility-administered encounter medications on file as of 07/25/2021.  ° ° °Surgical History: °Past Surgical History:  °Procedure Laterality Date  ° ANKLE FUSION Right 11/24/2007  ° /notes 12/04/2010  ° COLONOSCOPY WITH PROPOFOL N/A 05/13/2017  ° Procedure: COLONOSCOPY WITH PROPOFOL;  Surgeon:   Wohl, Darren, MD;  Location: [facility];  Service: Endoscopy;  Laterality: N/A;  requests early  ° FRACTURE SURGERY    ° JOINT REPLACEMENT    ° KNEE ARTHROCENTESIS Left X 5  ° "got infected . . ."  ° MULTIPLE TOOTH EXTRACTIONS    ° SHOULDER ARTHROSCOPY Left 06/2007  ° /notes 12/03/2010  ° TOTAL KNEE ARTHROPLASTY Left 2008  ° /notes 04/09/2010  ° TOTAL SHOULDER ARTHROPLASTY Left 09/20/2014  ° TOTAL SHOULDER ARTHROPLASTY Left 09/20/2014  ° Procedure: TOTAL SHOULDER ARTHROPLASTY;  Surgeon: Justin William Chandler, MD;  Location: [facility];   Service: Orthopedics;  Laterality: Left;  Left total shoulder arthroplasty  ° TOTAL SHOULDER ARTHROPLASTY Right 06/02/2018  ° Procedure: RIGHT TOTAL SHOULDER ARTHROPLASTY;  Surgeon: Chandler, Justin, MD;  Location: [facility] OR;  Service: Orthopedics;  Laterality: Right;  ° ° °Medical History: °Past Medical History:  °Diagnosis Date  ° Anxiety   ° Arthritis   ° "legs" (09/20/2014)  ° Depression   ° GERD (gastroesophageal reflux disease)   ° Hypertension   ° OA (osteoarthritis) of shoulder   ° right  ° Seizures (HCC) 1983  ° x1, S/P head injury  ° Serratia marcescens infection 2011  ° left knee  ° Umbilical hernia   ° Wears dentures   ° partial upper  ° ° °Family History: °Family History  °Problem Relation Age of Onset  ° COPD Mother   ° Heart disease Father   ° ° ° ° °Review of Systems  °Constitutional:  Negative for chills, fatigue and unexpected weight change.  °HENT:  Negative for congestion, postnasal drip, rhinorrhea, sneezing and sore throat.   °Eyes:  Negative for redness.  °Respiratory:  Negative for cough, chest tightness and shortness of breath.   °Cardiovascular:  Negative for chest pain and palpitations.  °Gastrointestinal:  Negative for abdominal pain, constipation, diarrhea, nausea and vomiting.  °Genitourinary:  Negative for dysuria and frequency.  °Musculoskeletal:  Positive for arthralgias, back pain and myalgias. Negative for joint swelling and neck pain.  °Skin:  Negative for rash.  °Neurological: Negative.  Negative for tremors and numbness.  °Hematological:  Negative for adenopathy. Does not bruise/bleed easily.  °Psychiatric/Behavioral:  Negative for behavioral problems (Depression), sleep disturbance and suicidal ideas. The patient is nervous/anxious.   ° ° °Vital Signs: °BP 136/83    Pulse 90    Temp 98.3 °F (36.8 °C)    Resp 16    Ht 6' (1.829 m)    Wt 270 lb 3.2 oz (122.6 kg)    SpO2 98%    BMI 36.65 kg/m²  ° ° °Physical Exam °Vitals and nursing note reviewed.  °Constitutional:   °   General: He  is not in acute distress. °   Appearance: He is well-developed. He is obese. He is not diaphoretic.  °HENT:  °   Head: Normocephalic and atraumatic.  °   Mouth/Throat:  °   Pharynx: No oropharyngeal exudate.  °Eyes:  °   Pupils: Pupils are equal, round, and reactive to light.  °Neck:  °   Thyroid: No thyromegaly.  °   Vascular: No JVD.  °   Trachea: No tracheal deviation.  °Cardiovascular:  °   Rate and Rhythm: Normal rate and regular rhythm.  °   Heart sounds: Normal heart sounds. No murmur heard. °  No friction rub. No gallop.  °Pulmonary:  °   Effort: Pulmonary effort is normal. No respiratory distress.  °   Breath sounds: No wheezing or   or rales.  Chest:     Chest wall: No tenderness.  Abdominal:     General: Bowel sounds are normal.     Palpations: Abdomen is soft.  Musculoskeletal:        General: Normal range of motion.     Cervical back: Normal range of motion and neck supple.     Comments: Chronic pain throughout legs, especially knees and back with movement  Lymphadenopathy:     Cervical: No cervical adenopathy.  Skin:    General: Skin is warm and dry.  Neurological:     Mental Status: He is alert and oriented to person, place, and time.     Cranial Nerves: No cranial nerve deficit.  Psychiatric:        Behavior: Behavior normal.        Thought Content: Thought content normal.        Judgment: Judgment normal.     LABS: Recent Results (from the past 2160 hour(s))  CBC w/Diff/Platelet     Status: None   Collection Time: 06/24/21  9:19 AM  Result Value Ref Range   WBC 8.0 3.4 - 10.8 x10E3/uL   RBC 5.37 4.14 - 5.80 x10E6/uL   Hemoglobin 16.0 13.0 - 17.7 g/dL   Hematocrit 47.5 37.5 - 51.0 %   MCV 89 79 - 97 fL   MCH 29.8 26.6 - 33.0 pg   MCHC 33.7 31.5 - 35.7 g/dL   RDW 13.3 11.6 - 15.4 %   Platelets 292 150 - 450 x10E3/uL   Neutrophils 70 Not Estab. %   Lymphs 19 Not Estab. %   Monocytes 6 Not Estab. %   Eos 4 Not Estab. %   Basos 1 Not Estab. %   Neutrophils Absolute  5.6 1.4 - 7.0 x10E3/uL   Lymphocytes Absolute 1.5 0.7 - 3.1 x10E3/uL   Monocytes Absolute 0.5 0.1 - 0.9 x10E3/uL   EOS (ABSOLUTE) 0.3 0.0 - 0.4 x10E3/uL   Basophils Absolute 0.1 0.0 - 0.2 x10E3/uL   Immature Granulocytes 0 Not Estab. %   Immature Grans (Abs) 0.0 0.0 - 0.1 x10E3/uL  Comprehensive metabolic panel     Status: Abnormal   Collection Time: 06/24/21  9:19 AM  Result Value Ref Range   Glucose 129 (H) 70 - 99 mg/dL   BUN 13 6 - 24 mg/dL   Creatinine, Ser 1.10 0.76 - 1.27 mg/dL   eGFR 78 >59 mL/min/1.73   BUN/Creatinine Ratio 12 9 - 20   Sodium 140 134 - 144 mmol/L   Potassium 4.4 3.5 - 5.2 mmol/L   Chloride 102 96 - 106 mmol/L   CO2 24 20 - 29 mmol/L   Calcium 9.6 8.7 - 10.2 mg/dL   Total Protein 7.1 6.0 - 8.5 g/dL   Albumin 4.3 3.8 - 4.9 g/dL   Globulin, Total 2.8 1.5 - 4.5 g/dL   Albumin/Globulin Ratio 1.5 1.2 - 2.2   Bilirubin Total 0.5 0.0 - 1.2 mg/dL   Alkaline Phosphatase 107 44 - 121 IU/L   AST 26 0 - 40 IU/L   ALT 17 0 - 44 IU/L  TSH + free T4     Status: None   Collection Time: 06/24/21  9:19 AM  Result Value Ref Range   TSH 2.120 0.450 - 4.500 uIU/mL   Free T4 0.96 0.82 - 1.77 ng/dL  Lipid Panel With LDL/HDL Ratio     Status: Abnormal   Collection Time: 06/24/21  9:19 AM  Result Value Ref Range  Cholesterol, Total 194 100 - 199 mg/dL   Triglycerides 102 0 - 149 mg/dL   HDL 41 >39 mg/dL   VLDL Cholesterol Cal 19 5 - 40 mg/dL   LDL Chol Calc (NIH) 134 (H) 0 - 99 mg/dL   LDL/HDL Ratio 3.3 0.0 - 3.6 ratio    Comment:                                     LDL/HDL Ratio                                             Men  Women                               1/2 Avg.Risk  1.0    1.5                                   Avg.Risk  3.6    3.2                                2X Avg.Risk  6.2    5.0                                3X Avg.Risk  8.0    6.1   POCT glycosylated hemoglobin (Hb A1C)     Status: Abnormal   Collection Time: 07/30/21 10:16 AM  Result Value Ref  Range   Hemoglobin A1C 5.9 (A) 4.0 - 5.6 %   HbA1c POC (<> result, manual entry)     HbA1c, POC (prediabetic range)     HbA1c, POC (controlled diabetic range)          Assessment/Plan: 1. Encounter for general adult medical examination with abnormal findings CPE performed, labs reviewed, due for colonoscopy  2. Essential hypertension, benign Stable, continue current medication - amLODipine (NORVASC) 5 MG tablet; Take 1 tablet (5 mg total) by mouth daily.  Dispense: 90 tablet; Refill: 3  3. Prediabetes - POCT glycosylated hemoglobin (Hb A1C) is 5.9 which is stable from previous checks. Will continue to monitor and patient advised to work on diet and exercise  4. GAD (generalized anxiety disorder) May continue wellbutrin with xanax as needed - buPROPion (WELLBUTRIN XL) 150 MG 24 hr tablet; TAKE ONE TAB PO QD IN AM  FOR ANXIETY  Dispense: 90 tablet; Refill: 1  5. Gastroesophageal reflux disease without esophagitis - omeprazole (PRILOSEC) 40 MG capsule; Take 1 capsule (40 mg total) by mouth daily.  Dispense: 90 capsule; Refill: 1  6. Muscle spasm - cyclobenzaprine (FLEXERIL) 10 MG tablet; TAKE 1 TABLET BY MOUTH EVERY NIGHT AT BEDTIME FOR MUSCLE SPASM  Dispense: 30 tablet; Refill: 2  7. Routine eye exam - Ambulatory referral to Ophthalmology  8. Screening for colorectal cancer - Ambulatory referral to Gastroenterology     General Counseling: Zachary Holmes understanding of the findings of todays visit and agrees with plan of treatment. I have discussed any further diagnostic evaluation that may be needed or ordered today. We also reviewed his  medications today. he has been encouraged to call the office with any questions or concerns that should arise related to todays visit.    Counseling:    Orders Placed This Encounter  Procedures   Ambulatory referral to Gastroenterology   Ambulatory referral to Ophthalmology   POCT glycosylated hemoglobin (Hb A1C)    Meds  ordered this encounter  Medications   DISCONTD: cyclobenzaprine (FLEXERIL) 10 MG tablet    Sig: TAKE 1 TABLET BY MOUTH EVERY NIGHT AT BEDTIME FOR MUSCLE SPASM    Dispense:  30 tablet    Refill:  2   DISCONTD: buPROPion (WELLBUTRIN XL) 150 MG 24 hr tablet    Sig: TAKE ONE TAB PO QD IN AM  FOR ANXIETY    Dispense:  90 tablet    Refill:  1   DISCONTD: amLODipine (NORVASC) 5 MG tablet    Sig: Take 1 tablet (5 mg total) by mouth daily.    Dispense:  90 tablet    Refill:  3   DISCONTD: omeprazole (PRILOSEC) 40 MG capsule    Sig: Take 1 capsule (40 mg total) by mouth daily.    Dispense:  90 capsule    Refill:  1   DISCONTD: amLODipine (NORVASC) 5 MG tablet    Sig: Take 1 tablet (5 mg total) by mouth daily.    Dispense:  90 tablet    Refill:  3   DISCONTD: buPROPion (WELLBUTRIN XL) 150 MG 24 hr tablet    Sig: TAKE ONE TAB PO QD IN AM  FOR ANXIETY    Dispense:  90 tablet    Refill:  1   DISCONTD: cyclobenzaprine (FLEXERIL) 10 MG tablet    Sig: TAKE 1 TABLET BY MOUTH EVERY NIGHT AT BEDTIME FOR MUSCLE SPASM    Dispense:  30 tablet    Refill:  2   DISCONTD: omeprazole (PRILOSEC) 40 MG capsule    Sig: Take 1 capsule (40 mg total) by mouth daily.    Dispense:  90 capsule    Refill:  1   amLODipine (NORVASC) 5 MG tablet    Sig: Take 1 tablet (5 mg total) by mouth daily.    Dispense:  90 tablet    Refill:  3   buPROPion (WELLBUTRIN XL) 150 MG 24 hr tablet    Sig: TAKE ONE TAB PO QD IN AM  FOR ANXIETY    Dispense:  90 tablet    Refill:  1   cyclobenzaprine (FLEXERIL) 10 MG tablet    Sig: TAKE 1 TABLET BY MOUTH EVERY NIGHT AT BEDTIME FOR MUSCLE SPASM    Dispense:  30 tablet    Refill:  2   omeprazole (PRILOSEC) 40 MG capsule    Sig: Take 1 capsule (40 mg total) by mouth daily.    Dispense:  90 capsule    Refill:  1    This patient was seen by Drema Dallas, PA-C in collaboration with Dr. Clayborn Bigness as a part of collaborative care agreement.  Total time spent:35 Minutes  Time  spent includes review of chart, medications, test results, and follow up plan with the patient.     Lavera Guise, MD  Internal Medicine

## 2021-07-28 MED ORDER — OMEPRAZOLE 40 MG PO CPDR: 40.0000 mg | DELAYED_RELEASE_CAPSULE | Freq: Every day | ORAL | 1 refills | Status: DC

## 2021-07-28 MED ORDER — AMLODIPINE BESYLATE 5 MG PO TABS: 5.0000 mg | ORAL_TABLET | Freq: Every day | ORAL | 3 refills | Status: DC

## 2021-07-28 MED ORDER — BUPROPION HCL ER (XL) 150 MG PO TB24: ORAL_TABLET | ORAL | 1 refills | Status: DC

## 2021-07-28 MED ORDER — CYCLOBENZAPRINE HCL 10 MG PO TABS: ORAL_TABLET | ORAL | 2 refills | Status: DC

## 2021-07-29 ENCOUNTER — Telehealth: Payer: Self-pay

## 2021-07-29 NOTE — Telephone Encounter (Signed)
Awaiting 07/25/21 notes for ophthalmology referral-Toni

## 2021-07-30 ENCOUNTER — Telehealth: Payer: Self-pay

## 2021-07-30 LAB — POCT GLYCOSYLATED HEMOGLOBIN (HGB A1C): Hemoglobin A1C: 5.9 % — AB (ref 4.0–5.6)

## 2021-07-30 NOTE — Telephone Encounter (Signed)
CALLED PATIENT NO ANSWER LEFT VOICEMAIL FOR A CALL BACK ? ?

## 2021-08-01 NOTE — Telephone Encounter (Signed)
Referral sent via [facility] to [facility]

## 2021-08-06 ENCOUNTER — Telehealth: Payer: Self-pay

## 2021-08-06 NOTE — Telephone Encounter (Signed)
CALLED PATIENT NO ANSWER LEFT VOICEMAIL FOR A CALL BACK ? ?

## 2021-08-21 ENCOUNTER — Other Ambulatory Visit: Payer: Self-pay | Admitting: Physician Assistant

## 2021-08-21 DIAGNOSIS — G8928 Other chronic postprocedural pain: Secondary | ICD-10-CM

## 2021-08-22 NOTE — Telephone Encounter (Signed)
No response from [facility]. Redirected to [facility] via [facility]-Toni

## 2021-08-25 ENCOUNTER — Telehealth: Payer: Self-pay

## 2021-08-25 ENCOUNTER — Other Ambulatory Visit: Payer: Self-pay | Admitting: Physician Assistant

## 2021-08-25 DIAGNOSIS — F411 Generalized anxiety disorder: Secondary | ICD-10-CM

## 2021-08-25 MED ORDER — ALPRAZOLAM 0.5 MG PO TABS: ORAL_TABLET | ORAL | 2 refills | Status: DC

## 2021-08-25 NOTE — Telephone Encounter (Signed)
Per [facility], patient declined-Toni

## 2021-08-25 NOTE — Telephone Encounter (Signed)
Error

## 2021-08-27 ENCOUNTER — Ambulatory Visit: Payer: Medicare HMO | Admitting: Student-PharmD

## 2021-08-27 ENCOUNTER — Other Ambulatory Visit: Payer: Self-pay

## 2021-08-27 DIAGNOSIS — I10 Essential (primary) hypertension: Secondary | ICD-10-CM

## 2021-08-27 DIAGNOSIS — E782 Mixed hyperlipidemia: Secondary | ICD-10-CM

## 2021-08-27 NOTE — Progress Notes (Signed)
Follow Up Pharmacist Visit   PIKE, SCANTLEBURY [id number] 80 years, Male  DOB: Jan 29, 1961  M: [phone]  Clinical Summary Situation: Patient presents via telephone for CCM F/U Visit. Background: Patient's primary complaint is dry mouth. Assessment:  -Patient's bupropion may be causing the dry mouth. -Patient's BP is generally well controlled in office. -Patient endorses adherence to medications. Recommendations: -Recommended a high-fiber diet with regular exercise. -Recommended patient try Biotene mouthwash/lozenges for dry mouth. -Continue current medication therapy  Patient Chart Prep  Completed by Charlann Lange on 08/25/2021  Chronic Conditions Patient's Chronic Conditions: Hypertension (HTN), Gastroesophageal Reflux Disease (GERD), Anxiety, Hyperlipidemia/Dyslipidemia (HLD)  Doctor and Hospital Visits Were there PCP Visits since last visit with the Pharmacist?: Yes Visit #1: 07/25/21 [name], PA-C. For Medicare wellness. No medication changes. Were there Specialist Visits since last visit with the Pharmacist?: No Was there a Hospital Visit in last 30 days?: No Were there other Hospital Visits since last visit with the Pharmacist?: No  Medication Information Have there been any medication changes from PCP or Specialist since last visit with the Pharmacist?: No Are there any Medication adherence gaps (beyond 5 days past due)?: No Medication adherence rates for the STAR rating drugs: None. List Patient's current Care Gaps: No current Care Gaps identified  Pre-Call Questions  Are you able to connect with Patient?: Yes Visit Type: Phone Call May we confirm what is the best phone # for the pharmacist to call you?: [phone]  Have you been in the hospital or emergency room recently?: No Do you have any questions or concerns that you want to make sure your pharmacist addresses with your during your appointment?: No What, if any, problems do you have  getting your medications from the pharmacy?: None Is there anything else that you would like me to pass along to your pharmacist or PCP?: No Patient reminded to bring medication bottles to visit (not a list of meds) OR have medication bottles pulled out prior to appointment time: Done  Disease Assessments  Subjective Information Current BP: 136/83 Current HR: 90 taken on: 07/25/2021 Weight: 270 BMI: 36.65 Last GFR: 78 taken on: 06/24/2021 Why did the patient present?: CCM F/U Visit Is Patient using [facility]?: No Name and location of Current pharmacy: [facility] Order Current Rx insurance plan: [employer] Are meds synced by current pharmacy?: No Are meds delivered by current pharmacy?: Yes - by mail order pharmacy Would patient benefit from direct intervention of clinical lead in dispensing process to optimize clinical outcomes?: Yes Are [facility] pharmacy services available where patient lives?: Yes Is patient disadvantaged to use [facility]?: Yes Does patient experience delays in picking up medications due to transportation concerns (getting to pharmacy)?: No Any additional demeanor/mood notes?: Patient is very pleasant to speak with and has medications on hand.  Hypertension (HTN) Assess this condition today?: Yes Is patient able to obtain BP reading today?: No Goal: <130/80 mmHG Hypertension Stage: Stage 1 (SBP: 130-139 or DBP: 80-89) Is Patient checking BP at home?: No How often does patient miss taking their blood pressure medications?: None Has patient experienced hypotension, dizziness, falls or bradycardia?: No Does Patient use RPM device?: No BP RPM device: Does patient qualify?: No We discussed: DASH diet:  following a diet emphasizing fruits and vegetables and low-fat dairy products along with whole grains, fish, poultry, and nuts. Reducing red meats and sugars., Smoking cessation, Targeting 150 minutes of aerobic activity per week, Reducing the amount of salt  intake to 1500mg /per day. Assessment:: Controlled  Drug: Amlodipine 5mg  1 tablet daily Assessment: Appropriate, Effective, Safe, Accessible Plan to: Continue medication therapy HC Follow up: 2 months Pharmacist Follow up: 5 months  Hyperlipidemia/Dyslipidemia (HLD) Last Lipid panel on: 06/24/2021 TC (Goal<200): 194 LDL: 134 HDL (Goal>40): 41 TG (Goal<150): 102 ASCVD 10-year risk?is:: Intermediate (7.5%-20%) ASCVD Risk Score: 17.7% Assess this condition today?: Yes LDL Goal: <100 Has patient tried and failed any HLD Medications?: Yes Medications failed: atorvastatin atorvastatin: muscle aches We discussed: How to reduce cholesterol through diet/weight management and physical activity., How a diet high in plant sterols (fruits/vegetables/nuts/whole grains/legumes) may reduce your cholesterol., Encouraged increasing fiber to a daily intake of 10-25g/day Assessment:: Uncontrolled Drug: Ezetimibe 10mg  1 tablet daily Assessment: Appropriate, Effective, Safe, Accessible Additional Info: Patient is not willing to try any different statin medication. Discussed with patient the importance of diet and exercise. Plan to (other): Continue current medication HC Follow up: 2 months Pharmacist Follow up: 5 months  Exercise, Diet and Non-Drug Coordination Needs Additional exercise counseling points. We discussed: targeting at least 151 minutes per week of moderate-intensity aerobic exercise. Additional diet counseling points. We discussed: key components of the DASH diet, aiming to consume at least 8 cups of water day Discussed Non-Drug Care Coordination Needs: Yes Does Patient have Medication financial barriers?: No Accountable [facility] Communities Health-Related Social Needs Screening Tool -  SDOH   What is your living situation today? (ref #1): I have a steady place to live Think about the place you live. Do you have problems with any of the following? (ref #2): None of the above Within the  past 12 months, you worried that your food would run out before you got money to buy more (ref #3): Never true Within the past 12 months, the food you bought just didn't last and you didn't have money to get more (ref #4): Never true In the past 12 months, has lack of reliable transportation kept you from medical appointments, meetings, work or from getting things needed for daily living? (ref #5): No In the past 12 months, has the electric, gas, oil, or water company threatened to shut off services in your home? (ref #6): No How often does anyone, including family and friends, physically hurt you? (ref #7): Never (1) How often does anyone, including family and friends, insult or talk down to you? (ref #8): Never (1) How often does anyone, including friends and family, threaten you with harm? (ref #9): Never (1) How often does anyone, including family and friends, scream or curse at you? (ref #10): Never (1)  Fat and Cholesterol Restricted Eating Plan Getting too much fat and cholesterol in your diet may cause health problems. Choosing the right foods helps keep your fat and cholesterol at normal levels. This can keep you from getting certain diseases. Your doctor may recommend an eating plan that includes: Total fat: ______% or less of total calories a day. This is ______g of fat a day. Saturated fat: ______% or less of total calories a day. This is ______g of saturated fat a day. Cholesterol: less than _________mg a day. Fiber: ______g a day. What are tips for following this plan? General tips Work with your doctor to lose weight if you need to. Avoid: Foods with added sugar. Fried foods. Foods with trans fat or partially hydrogenated oils. This includes some margarines and baked goods. If you drink alcohol: Limit how much you have to: 0-1 drink a day for women who are not pregnant. 0-2 drinks a day for men. Know  how much alcohol is in a drink. In the [place] one drink equals one 12 oz  bottle of beer (355 mL), one 5 oz glass of wine (148 mL), or one 1 oz glass of hard liquor (44 mL). Reading food labels Check food labels for: Trans fats. Partially hydrogenated oils. Saturated fat (g) in each serving. Cholesterol (mg) in each serving. Fiber (g) in each serving. Choose foods with healthy fats, such as: Monounsaturated fats and polyunsaturated fats. These include olive and canola oil, flaxseeds, walnuts, almonds, and seeds. Omega-3 fats. These are found in certain fish, flaxseed oil, and ground flaxseeds. Choose grain products that have whole grains. Look for the word "whole" as the first word in the ingredient list. Cooking Cook foods using low-fat methods. These include baking, boiling, grilling, and broiling. Eat more home-cooked foods. Eat at restaurants and buffets less often. Eat less fast food. Avoid cooking using saturated fats, such as butter, cream, palm oil, palm kernel oil, and coconut oil. Meal planning A plate with examples of foods in a healthy diet.  At meals, divide your plate into four equal parts: Fill one-half of your plate with vegetables, green salads, and fruit. Fill one-fourth of your plate with whole grains. Fill one-fourth of your plate with low-fat (lean) protein foods. Eat fish that is high in omega-3 fats at least two times a week. This includes mackerel, tuna, sardines, and salmon. Eat foods that are high in fiber, such as whole grains, beans, apples, pears, berries, broccoli, carrots, peas, and barley. What foods should I eat? Fruits All fresh, canned (in natural juice), or frozen fruits. Vegetables Fresh or frozen vegetables (raw, steamed, roasted, or grilled). Green salads. Grains Whole grains, such as whole wheat or whole grain breads, crackers, cereals, and pasta. Unsweetened oatmeal, bulgur, barley, quinoa, or brown rice. Corn or whole wheat flour tortillas. Meats and other protein foods Ground beef (85% or leaner), grass-fed beef,  or beef trimmed of fat. Skinless chicken or [place]. Ground chicken or [place]. Pork trimmed of fat. All fish and seafood. Egg whites. Dried beans, peas, or lentils. Unsalted nuts or seeds. Unsalted canned beans. Nut butters without added sugar or oil. Dairy Low-fat or nonfat dairy products, such as skim or 1% milk, 2% or reduced-fat cheeses, low-fat and fat-free ricotta or cottage cheese, or plain low-fat and nonfat yogurt. Fats and oils Tub margarine without trans fats. Light or reduced-fat mayonnaise and salad dressings. Avocado. Olive, canola, sesame, or safflower oils. The items listed above may not be a complete list of foods and beverages you can eat. Contact a dietitian for more information. What foods should I avoid? Fruits Canned fruit in heavy syrup. Fruit in cream or butter sauce. Fried fruit. Vegetables Vegetables cooked in cheese, cream, or butter sauce. Fried vegetables. Grains White bread. White pasta. White rice. Cornbread. Bagels, pastries, and croissants. Crackers and snack foods that contain trans fat and hydrogenated oils. Meats and other protein foods Fatty cuts of meat. Ribs, chicken wings, bacon, sausage, bologna, salami, chitterlings, fatback, hot dogs, bratwurst, and packaged lunch meats. Liver and organ meats. Whole eggs and egg yolks. Chicken and [place] with skin. Fried meat. Dairy Whole or 2% milk, cream, half-and-half, and cream cheese. Whole milk cheeses. Whole-fat or sweetened yogurt. Full-fat cheeses. Nondairy creamers and whipped toppings. Processed cheese, cheese spreads, and cheese curds. Fats and oils Butter, stick margarine, lard, shortening, ghee, or bacon fat. Coconut, palm kernel, and palm oils. Beverages Alcohol. Sugar-sweetened drinks such as sodas, lemonade, and fruit drinks.  Sweets and desserts Corn syrup, sugars, honey, and molasses. Candy. Jam and jelly. Syrup. Sweetened cereals. Cookies, pies, cakes, donuts, muffins, and ice cream. The items listed  above may not be a complete list of foods and beverages you should avoid. Contact a dietitian for more information. Summary Choosing the right foods helps keep your fat and cholesterol at normal levels. This can keep you from getting certain diseases. At meals, fill one-half of your plate with vegetables, green salads, and fruits. Eat high fiber foods, like whole grains, beans, apples, pears, berries, carrots, peas, and barley. Limit added sugar, saturated fats, alcohol, and fried foods. This information is not intended to replace advice given to you by your health care provider. Make sure you discuss any questions you have with your health care provider.     Alena Bills Clinical Pharmacist [phone]

## 2021-08-31 DIAGNOSIS — K219 Gastro-esophageal reflux disease without esophagitis: Secondary | ICD-10-CM | POA: Diagnosis not present

## 2021-08-31 DIAGNOSIS — I10 Essential (primary) hypertension: Secondary | ICD-10-CM | POA: Diagnosis not present

## 2021-09-16 ENCOUNTER — Telehealth: Payer: Self-pay | Admitting: Student-PharmD

## 2021-09-16 NOTE — Progress Notes (Signed)
°  Chronic Care Management Pharmacy Assistant   Name: Zachary Holmes  MRN: [id number] DOB: Apr 06, 1961  Reason for Encounter: Mail Care Plan  Medications: Outpatient Encounter Medications as of 09/16/2021  Medication Sig   traMADol (ULTRAM) 50 MG tablet TAKE 1 TABLET BY MOUTH TWICE DAILY AS NEEDED FOR PAIN   ALPRAZolam (XANAX) 0.5 MG tablet TAKE 1 TABLET BY MOUTH EVERY DAY AS NEEDED FOR ANXIETY   amLODipine (NORVASC) 5 MG tablet Take 1 tablet (5 mg total) by mouth daily.   buPROPion (WELLBUTRIN XL) 150 MG 24 hr tablet TAKE ONE TAB PO QD IN AM  FOR ANXIETY   cyclobenzaprine (FLEXERIL) 10 MG tablet TAKE 1 TABLET BY MOUTH EVERY NIGHT AT BEDTIME FOR MUSCLE SPASM   ezetimibe (ZETIA) 10 MG tablet Take 1 tablet (10 mg total) by mouth daily.   omeprazole (PRILOSEC) 40 MG capsule Take 1 capsule (40 mg total) by mouth daily.   No facility-administered encounter medications on file as of 09/16/2021.   Reviewed the patients visit reinsured it was completed per the pharmacist Alena Bills request. Printed the CCM care plan. Mailed the patient CCM care plan to their most recent address on file.  Charlann Lange, [place]  [phone]

## 2021-10-03 ENCOUNTER — Telehealth: Payer: Medicare HMO

## 2021-10-22 ENCOUNTER — Telehealth: Payer: Self-pay | Admitting: Student-PharmD

## 2021-10-22 NOTE — Progress Notes (Addendum)
Hypertension (HTN) Review Call ? Zachary Holmes, Zachary Holmes [id number] ?38 years, Male  DOB: 28-Oct-1960  M: [phone] ? ?Hypertension Review (HC) ?Completed by Charlann Lange on 10/16/2021 02:44 PM ? ?Chart Review ?Is the patient enrolled in RPM with BP Monitor?: No ? ?BP #1 reading (last): 136/83 ?on: 07/25/2021 ? ?BP #2 reading: 118/90 ?on: 04/25/2021 ? ?BP #3 reading: 126/90 ?on: 01/24/2021 ? ?Any of the last 3 BP > 140/90 mmHg?: No ? ?What recent interventions have been made by any provider to improve the patient's conditions in the last 3 months?: None. ? ?Any recent hospitalizations or ED visits since last visit with CPP?: No ? ?Adherence Review ?Adherence rates for STAR metric medications: None. ? ?Adherence rates for medications indicated for disease state being reviewed: None. ? ?Does the patient have >5 day gap between last estimated fill dates for any of the above medications?: No ? ?Disease State Questions ?Able to connect with the Patient?: No ? ?Misc. Response/Information:: [place] 10/16/21-10/21/21-10/22/21 ? ?Engagement Notes ?McLemore, Veronica on 10/16/2021 02:45 PM ?HC Chart Review: 15 min 10/16/21 - 5 min 10/22/21 ?HC Assessment call time spent: ?CPP Office Visit Documentation: ?CPP Coordination of Care: ?CPP Care Plan Review: ? ?Charlann Lange, RMA ?Healthcare Concierge  ?[phone] ? ? ?

## 2021-10-23 ENCOUNTER — Ambulatory Visit (INDEPENDENT_AMBULATORY_CARE_PROVIDER_SITE_OTHER): Payer: Medicare HMO | Admitting: Physician Assistant

## 2021-10-23 ENCOUNTER — Other Ambulatory Visit: Payer: Self-pay

## 2021-10-23 ENCOUNTER — Encounter: Payer: Self-pay | Admitting: Physician Assistant

## 2021-10-23 DIAGNOSIS — F411 Generalized anxiety disorder: Secondary | ICD-10-CM | POA: Diagnosis not present

## 2021-10-23 DIAGNOSIS — E782 Mixed hyperlipidemia: Secondary | ICD-10-CM | POA: Diagnosis not present

## 2021-10-23 DIAGNOSIS — I10 Essential (primary) hypertension: Secondary | ICD-10-CM

## 2021-10-23 DIAGNOSIS — R7303 Prediabetes: Secondary | ICD-10-CM

## 2021-10-23 DIAGNOSIS — G8928 Other chronic postprocedural pain: Secondary | ICD-10-CM | POA: Diagnosis not present

## 2021-10-23 LAB — POCT GLYCOSYLATED HEMOGLOBIN (HGB A1C): Hemoglobin A1C: 5.9 % — AB (ref 4.0–5.6)

## 2021-10-23 NOTE — Progress Notes (Signed)
[facility] ?[address] ?[place], [place] [address] ? ?Internal MEDICINE  ?Office Visit Note ? ?Patient Name: Zachary Holmes ? [id number]  ?[id number] ? ?Date of Service: 10/23/2021 ? ?Chief Complaint  ?Patient presents with  ? Follow-up  ? Depression  ? Gastroesophageal Reflux  ? Hypertension  ? Quality Metric Gaps  ?  Shingles Vaccine  ? ? ?HPI ?Pt is here for routine follow up and has no new complaints today ?-He is working on scheduling eye exam and colonoscopy.  States he has been playing phone tag with GI office and will give them a call back to get this scheduled. ?-Bp stable, does not check at home though ?-States he has been working on watching his diet ?-States he keeps forgetting to get his shingles shot done but will do so now ?-Continues to take Xanax as needed for anxiety as well as tramadol when needed for pain--patient will call when next refill is needed as he only takes these as needed ?-Continues to tolerate Zetia after intolerance to statin ? ?Current Medication: ?Outpatient Encounter Medications as of 10/23/2021  ?Medication Sig  ? ALPRAZolam (XANAX) 0.5 MG tablet TAKE 1 TABLET BY MOUTH EVERY DAY AS NEEDED FOR ANXIETY  ? amLODipine (NORVASC) 5 MG tablet Take 1 tablet (5 mg total) by mouth daily.  ? buPROPion (WELLBUTRIN XL) 150 MG 24 hr tablet TAKE ONE TAB PO QD IN AM  FOR ANXIETY  ? cyclobenzaprine (FLEXERIL) 10 MG tablet TAKE 1 TABLET BY MOUTH EVERY NIGHT AT BEDTIME FOR MUSCLE SPASM  ? ezetimibe (ZETIA) 10 MG tablet Take 1 tablet (10 mg total) by mouth daily.  ? omeprazole (PRILOSEC) 40 MG capsule Take 1 capsule (40 mg total) by mouth daily.  ? traMADol (ULTRAM) 50 MG tablet TAKE 1 TABLET BY MOUTH TWICE DAILY AS NEEDED FOR PAIN  ? ?No facility-administered encounter medications on file as of 10/23/2021.  ? ? ?Surgical History: ?Past Surgical History:  ?Procedure Laterality Date  ? ANKLE FUSION Right 11/24/2007  ? Archie Endo 12/04/2010  ? COLONOSCOPY WITH PROPOFOL N/A 05/13/2017  ?  Procedure: COLONOSCOPY WITH PROPOFOL;  Surgeon: Lucilla Lame, MD;  Location: [facility];  Service: Endoscopy;  Laterality: N/A;  requests early  ? FRACTURE SURGERY    ? JOINT REPLACEMENT    ? KNEE ARTHROCENTESIS Left X 5  ? "got infected . . ."  ? MULTIPLE TOOTH EXTRACTIONS    ? SHOULDER ARTHROSCOPY Left 06/2007  ? Archie Endo 12/03/2010  ? TOTAL KNEE ARTHROPLASTY Left 2008  ? Archie Endo 04/09/2010  ? TOTAL SHOULDER ARTHROPLASTY Left 09/20/2014  ? TOTAL SHOULDER ARTHROPLASTY Left 09/20/2014  ? Procedure: TOTAL SHOULDER ARTHROPLASTY;  Surgeon: Nita Sells, MD;  Location: [facility];  Service: Orthopedics;  Laterality: Left;  Left total shoulder arthroplasty  ? TOTAL SHOULDER ARTHROPLASTY Right 06/02/2018  ? Procedure: RIGHT TOTAL SHOULDER ARTHROPLASTY;  Surgeon: Tania Ade, MD;  Location: [facility];  Service: Orthopedics;  Laterality: Right;  ? ? ?Medical History: ?Past Medical History:  ?Diagnosis Date  ? Anxiety   ? Arthritis   ? "legs" (09/20/2014)  ? Depression   ? GERD (gastroesophageal reflux disease)   ? Hypertension   ? OA (osteoarthritis) of shoulder   ? right  ? Seizures ([place]) 1983  ? x1, S/P head injury  ? Serratia marcescens infection 2011  ? left knee  ? Umbilical hernia   ? Wears dentures   ? partial upper  ? ? ?Family History: ?Family History  ?Problem Relation Age of  Onset  ? COPD Mother   ? Heart disease Father   ? ? ?Social History  ? ?Socioeconomic History  ? Marital status: Single  ?  Spouse name: Not on file  ? Number of children: Not on file  ? Years of education: Not on file  ? Highest education level: Not on file  ?Occupational History  ? Not on file  ?Tobacco Use  ? Smoking status: Every Day  ?  Packs/day: 0.50  ?  Years: 20.00  ?  Pack years: 10.00  ?  Types: Cigarettes  ? Smokeless tobacco: Never  ? Tobacco comments:  ?  Half pack daily  ?Vaping Use  ? Vaping Use: Never used  ?Substance and Sexual Activity  ? Alcohol use: Not Currently  ?  Alcohol/week: 12.0 standard drinks  ?  Types: 12  Cans of beer per week  ?  Comment: SOBER SINCE 05/17/2017  ? Drug use: Yes  ?  Types: Marijuana  ?  Comment: DAILY  ? Sexual activity: Not Currently  ?Other Topics Concern  ? Not on file  ?Social History Narrative  ? Not on file  ? ?Social Determinants of Health  ? ?Financial Resource Strain: Low Risk   ? Difficulty of Paying Living Expenses: Not very hard  ?Food Insecurity: Not on file  ?Transportation Needs: Not on file  ?Physical Activity: Not on file  ?Stress: Not on file  ?Social Connections: Not on file  ?Intimate Partner Violence: Not on file  ? ? ? ? ?Review of Systems  ?Constitutional:  Negative for chills, fatigue and unexpected weight change.  ?HENT:  Negative for congestion, postnasal drip, rhinorrhea, sneezing and sore throat.   ?Eyes:  Negative for redness.  ?Respiratory:  Negative for cough, chest tightness and shortness of breath.   ?Cardiovascular:  Negative for chest pain and palpitations.  ?Gastrointestinal:  Negative for abdominal pain, constipation, diarrhea, nausea and vomiting.  ?Genitourinary:  Negative for dysuria and frequency.  ?Musculoskeletal:  Positive for arthralgias, back pain and myalgias. Negative for joint swelling and neck pain.  ?Skin:  Negative for rash.  ?Neurological: Negative.  Negative for tremors and numbness.  ?Hematological:  Negative for adenopathy. Does not bruise/bleed easily.  ?Psychiatric/Behavioral:  Negative for behavioral problems (Depression), sleep disturbance and suicidal ideas. The patient is nervous/anxious.   ? ?Vital Signs: ?BP 128/90   Pulse 92   Temp 98.3 ?F (36.8 ?C)   Resp 16   Ht 6' (1.829 m)   Wt 273 lb (123.8 kg)   SpO2 94%   BMI 37.03 kg/m?  ? ? ?Physical Exam ?Vitals and nursing note reviewed.  ?Constitutional:   ?   General: He is not in acute distress. ?   Appearance: He is well-developed. He is obese. He is not diaphoretic.  ?HENT:  ?   Head: Normocephalic and atraumatic.  ?   Mouth/Throat:  ?   Pharynx: No oropharyngeal exudate.  ?Eyes:   ?   Pupils: Pupils are equal, round, and reactive to light.  ?Neck:  ?   Thyroid: No thyromegaly.  ?   Vascular: No JVD.  ?   Trachea: No tracheal deviation.  ?Cardiovascular:  ?   Rate and Rhythm: Normal rate and regular rhythm.  ?   Heart sounds: Normal heart sounds. No murmur heard. ?  No friction rub. No gallop.  ?Pulmonary:  ?   Effort: Pulmonary effort is normal. No respiratory distress.  ?   Breath sounds: No wheezing or rales.  ?Chest:  ?  Chest wall: No tenderness.  ?Abdominal:  ?   General: Bowel sounds are normal.  ?   Palpations: Abdomen is soft.  ?Musculoskeletal:     ?   General: Normal range of motion.  ?   Cervical back: Normal range of motion and neck supple.  ?   Comments: Chronic pain throughout legs, especially knees and back with movement  ?Lymphadenopathy:  ?   Cervical: No cervical adenopathy.  ?Skin: ?   General: Skin is warm and dry.  ?Neurological:  ?   Mental Status: He is alert and oriented to person, place, and time.  ?   Cranial Nerves: No cranial nerve deficit.  ?Psychiatric:     ?   Behavior: Behavior normal.     ?   Thought Content: Thought content normal.     ?   Judgment: Judgment normal.  ? ? ? ? ? ?Assessment/Plan: ?1. Essential hypertension, benign ?Stable, continue amlodipine as before ? ?2. Prediabetes ?- POCT HgB A1C is 5.9 which is stable from last check.  Patient still not interested in starting any medications and will continue to work on controlling with diet and exercise ? ?3. GAD (generalized anxiety disorder) ?May continue Wellbutrin as well as Xanax as needed ? ?4. Mixed hyperlipidemia ?Continue Zetia ? ?5. Other chronic postprocedural pain ?May continue tramadol as needed ? ? ?General Counseling: carnel stegman understanding of the findings of todays visit and agrees with plan of treatment. I have discussed any further diagnostic evaluation that may be needed or ordered today. We also reviewed his medications today. he has been encouraged to call the office  with any questions or concerns that should arise related to todays visit. ? ? ? ?Orders Placed This Encounter  ?Procedures  ? POCT HgB A1C  ? ? ?No orders of the defined types were placed in this encounter. ? ? ?This p

## 2021-11-04 ENCOUNTER — Other Ambulatory Visit: Payer: Self-pay

## 2021-11-04 DIAGNOSIS — Z86010 Personal history of colonic polyps: Secondary | ICD-10-CM

## 2021-11-04 MED ORDER — PEG 3350-KCL-NA BICARB-NACL 420 G PO SOLR: 4000.0000 mL | Freq: Once | ORAL | 0 refills | Status: AC

## 2021-11-04 NOTE — Progress Notes (Signed)
Gastroenterology Pre-Procedure Review ? ?Request Date: 12/04/2021 ?Requesting Physician: Dr. Allen Norris ? ?PATIENT REVIEW QUESTIONS: The patient responded to the following health history questions as indicated:   ? ?1. Are you having any GI issues? no ?2. Do you have a personal history of Polyps? yes (last colonoscopy ) ?3. Do you have a family history of Colon Cancer or Polyps? no ?4. Diabetes Mellitus? no ?5. Joint replacements in the past 12 months?no ?6. Major health problems in the past 3 months?no ?7. Any artificial heart valves, MVP, or defibrillator?no ?   ?MEDICATIONS & ALLERGIES:    ?Patient reports the following regarding taking any anticoagulation/antiplatelet therapy:   ?Plavix, Coumadin, Eliquis, Xarelto, Lovenox, Pradaxa, Brilinta, or Effient? no ?Aspirin? no ? ?Patient confirms/reports the following medications:  ?Current Outpatient Medications  ?Medication Sig Dispense Refill  ? ALPRAZolam (XANAX) 0.5 MG tablet TAKE 1 TABLET BY MOUTH EVERY DAY AS NEEDED FOR ANXIETY 30 tablet 2  ? amLODipine (NORVASC) 5 MG tablet Take 1 tablet (5 mg total) by mouth daily. 90 tablet 3  ? atorvastatin (LIPITOR) 10 MG tablet Take 10 mg by mouth daily.    ? buPROPion (WELLBUTRIN XL) 150 MG 24 hr tablet TAKE ONE TAB PO QD IN AM  FOR ANXIETY 90 tablet 1  ? cyclobenzaprine (FLEXERIL) 10 MG tablet TAKE 1 TABLET BY MOUTH EVERY NIGHT AT BEDTIME FOR MUSCLE SPASM 30 tablet 2  ? ezetimibe (ZETIA) 10 MG tablet Take 1 tablet (10 mg total) by mouth daily. 90 tablet 3  ? omeprazole (PRILOSEC) 40 MG capsule Take 1 capsule (40 mg total) by mouth daily. 90 capsule 1  ? traMADol (ULTRAM) 50 MG tablet TAKE 1 TABLET BY MOUTH TWICE DAILY AS NEEDED FOR PAIN 60 tablet 1  ? ?No current facility-administered medications for this visit.  ? ? ?Patient confirms/reports the following allergies:  ?No Known Allergies ? ?No orders of the defined types were placed in this encounter. ? ? ?AUTHORIZATION INFORMATION ?Primary Insurance: ?1D#: ?Group  #: ? ?Secondary Insurance: ?1D#: ?Group #: ? ?SCHEDULE INFORMATION: ?Date: 12/04/2021 ?Time: ?Location:[facility] ? ?

## 2021-11-15 ENCOUNTER — Other Ambulatory Visit: Payer: Self-pay | Admitting: Physician Assistant

## 2021-11-15 DIAGNOSIS — M62838 Other muscle spasm: Secondary | ICD-10-CM

## 2021-11-27 ENCOUNTER — Other Ambulatory Visit: Payer: Self-pay

## 2021-11-27 ENCOUNTER — Encounter: Payer: Self-pay | Admitting: Gastroenterology

## 2021-12-04 ENCOUNTER — Other Ambulatory Visit: Payer: Self-pay

## 2021-12-04 ENCOUNTER — Ambulatory Visit: Payer: Medicare HMO | Admitting: Anesthesiology

## 2021-12-04 ENCOUNTER — Encounter: Payer: Self-pay | Admitting: Gastroenterology

## 2021-12-04 ENCOUNTER — Ambulatory Visit
Admission: RE | Admit: 2021-12-04 | Discharge: 2021-12-04 | Disposition: A | Discharge status: Home / Self Care | Payer: Medicare HMO | Attending: Gastroenterology | Admitting: Gastroenterology

## 2021-12-04 ENCOUNTER — Encounter
Admission: RE | Disposition: A | Discharge status: Home / Self Care | Payer: Self-pay | Source: Home / Self Care | Attending: Gastroenterology

## 2021-12-04 DIAGNOSIS — D124 Benign neoplasm of descending colon: Secondary | ICD-10-CM | POA: Diagnosis not present

## 2021-12-04 DIAGNOSIS — Z8249 Family history of ischemic heart disease and other diseases of the circulatory system: Secondary | ICD-10-CM | POA: Diagnosis not present

## 2021-12-04 DIAGNOSIS — Z1211 Encounter for screening for malignant neoplasm of colon: Secondary | ICD-10-CM | POA: Insufficient documentation

## 2021-12-04 DIAGNOSIS — I10 Essential (primary) hypertension: Secondary | ICD-10-CM | POA: Diagnosis not present

## 2021-12-04 DIAGNOSIS — Z86010 Personal history of colonic polyps: Secondary | ICD-10-CM | POA: Diagnosis present

## 2021-12-04 DIAGNOSIS — F17210 Nicotine dependence, cigarettes, uncomplicated: Secondary | ICD-10-CM | POA: Insufficient documentation

## 2021-12-04 DIAGNOSIS — K219 Gastro-esophageal reflux disease without esophagitis: Secondary | ICD-10-CM | POA: Insufficient documentation

## 2021-12-04 DIAGNOSIS — K640 First degree hemorrhoids: Secondary | ICD-10-CM | POA: Insufficient documentation

## 2021-12-04 DIAGNOSIS — K635 Polyp of colon: Secondary | ICD-10-CM | POA: Diagnosis not present

## 2021-12-04 HISTORY — PX: POLYPECTOMY: SHX5525

## 2021-12-04 HISTORY — PX: COLONOSCOPY WITH PROPOFOL: SHX5780

## 2021-12-04 SURGERY — COLONOSCOPY WITH PROPOFOL
Anesthesia: General | Site: Rectum

## 2021-12-04 MED ORDER — STERILE WATER FOR IRRIGATION IR SOLN
Status: DC | PRN
  Administered 2021-12-04: 100 mL

## 2021-12-04 MED ORDER — STERILE WATER FOR IRRIGATION IR SOLN
Status: DC | PRN
  Administered 2021-12-04: 60 mL

## 2021-12-04 MED ORDER — ACETAMINOPHEN 160 MG/5ML PO SOLN: 325.0000 mg | Freq: Once | ORAL | Status: DC

## 2021-12-04 MED ORDER — ACETAMINOPHEN 325 MG PO TABS: 325.0000 mg | ORAL_TABLET | Freq: Once | ORAL | Status: DC

## 2021-12-04 MED ORDER — SODIUM CHLORIDE 0.9 % IV SOLN: INTRAVENOUS | Status: DC

## 2021-12-04 MED ORDER — PROPOFOL 10 MG/ML IV BOLUS
INTRAVENOUS | Status: DC | PRN
  Administered 2021-12-04 (×4): 30 mg via INTRAVENOUS
  Administered 2021-12-04 (×2): 50 mg via INTRAVENOUS
  Administered 2021-12-04: 80 mg via INTRAVENOUS

## 2021-12-04 MED ORDER — LIDOCAINE HCL (CARDIAC) PF 100 MG/5ML IV SOSY
PREFILLED_SYRINGE | INTRAVENOUS | Status: DC | PRN
  Administered 2021-12-04: 60 mg via INTRAVENOUS

## 2021-12-04 MED ORDER — LACTATED RINGERS IV SOLN: INTRAVENOUS | Status: DC

## 2021-12-04 SURGICAL SUPPLY — 8 items
GOWN CVR UNV OPN BCK APRN NK (MISCELLANEOUS) ×4 IMPLANT
GOWN ISOL THUMB LOOP REG UNIV (MISCELLANEOUS) ×4
KIT PRC NS LF DISP ENDO (KITS) ×2 IMPLANT
KIT PROCEDURE OLYMPUS (KITS) ×2
MANIFOLD NEPTUNE II (INSTRUMENTS) ×3 IMPLANT
SNARE COLD EXACTO (MISCELLANEOUS) ×1 IMPLANT
TRAP ETRAP POLY (MISCELLANEOUS) ×1 IMPLANT
WATER STERILE IRR 250ML POUR (IV SOLUTION) ×3 IMPLANT

## 2021-12-04 NOTE — Anesthesia Preprocedure Evaluation (Signed)
Anesthesia Evaluation  ?Patient identified by MRN, date of birth, ID band ?Patient awake ? ? ? ?Reviewed: ?Allergy & Precautions, H&P , NPO status , Patient's Chart, lab work & pertinent test results ? ?Airway ?Mallampati: II ? ?TM Distance: >3 FB ?Neck ROM: full ? ? ? Dental ?no notable dental hx. ? ?  ?Pulmonary ?Current Smoker,  ?  ?Pulmonary exam normal ?breath sounds clear to auscultation ? ? ? ? ? ? Cardiovascular ?hypertension, Normal cardiovascular exam ?Rhythm:regular Rate:Normal ? ?HLD ?  ?Neuro/Psych ?PSYCHIATRIC DISORDERS   ? GI/Hepatic ?GERD  ,  ?Endo/Other  ? ? Renal/GU ?  ? ?  ?Musculoskeletal ? ? Abdominal ?  ?Peds ? Hematology ?  ?Anesthesia Other Findings ? ? Reproductive/Obstetrics ? ?  ? ? ? ? ? ? ? ? ? ? ? ? ? ?  ?  ? ? ? ? ? ? ? ? ?Anesthesia Physical ?Anesthesia Plan ? ?ASA: 2 ? ?Anesthesia Plan: General  ? ?Post-op Pain Management: Minimal or no pain anticipated  ? ?Induction: Intravenous ? ?PONV Risk Score and Plan: 2 and Treatment may vary due to age or medical condition, Propofol infusion and TIVA ? ?Airway Management Planned: Natural Airway ? ?Additional Equipment:  ? ?Intra-op Plan:  ? ?Post-operative Plan:  ? ?Informed Consent: I have reviewed the patients History and Physical, chart, labs and discussed the procedure including the risks, benefits and alternatives for the proposed anesthesia with the patient or authorized representative who has indicated his/her understanding and acceptance.  ? ? ? ?Dental Advisory Given ? ?Plan Discussed with: CRNA ? ?Anesthesia Plan Comments:   ? ? ? ? ? ? ?Anesthesia Quick Evaluation ? ?

## 2021-12-04 NOTE — Anesthesia Postprocedure Evaluation (Signed)
Anesthesia Post Note ? ?Patient: Zachary Holmes ? ?Procedure(s) Performed: COLONOSCOPY WITH BIOPSIES (Rectum) ?POLYPECTOMY (Rectum) ? ? ?  ?Patient location during evaluation: PACU ?Anesthesia Type: General ?Level of consciousness: awake and alert and oriented ?Pain management: satisfactory to patient ?Vital Signs Assessment: post-procedure vital signs reviewed and stable ?Respiratory status: spontaneous breathing, nonlabored ventilation and respiratory function stable ?Cardiovascular status: blood pressure returned to baseline and stable ?Postop Assessment: Adequate PO intake and No signs of nausea or vomiting ?Anesthetic complications: no ? ? ?No notable events documented. ? ?Caton, Popowski ? ? ? ? ? ?

## 2021-12-04 NOTE — Op Note (Signed)
[facility] ?Gastroenterology ?Patient Name: Zachary Holmes ?Procedure Date: 12/04/2021 8:44 AM ?MRN: [id number] ?Account #: [id number] ?Date of Birth: Feb 11, 1961 ?Admit Type: Outpatient ?Age: 61 ?Room: [facility] OR ROOM 01 ?Gender: Male ?Note Status: Finalized ?Instrument Name: [id number] ?Procedure:             Colonoscopy ?Indications:           High risk colon cancer surveillance: Personal history  ?                       of colonic polyps ?Providers:             Lucilla Lame MD, MD ?Medicines:             Propofol per Anesthesia ?Complications:         No immediate complications. ?Procedure:             Pre-Anesthesia Assessment: ?                       - Prior to the procedure, a History and Physical was  ?                       performed, and patient medications and allergies were  ?                       reviewed. The patient's tolerance of previous  ?                       anesthesia was also reviewed. The risks and benefits  ?                       of the procedure and the sedation options and risks  ?                       were discussed with the patient. All questions were  ?                       answered, and informed consent was obtained. Prior  ?                       Anticoagulants: The patient has taken no previous  ?                       anticoagulant or antiplatelet agents. ASA Grade  ?                       Assessment: II - A patient with mild systemic disease.  ?                       After reviewing the risks and benefits, the patient  ?                       was deemed in satisfactory condition to undergo the  ?                       procedure. ?                       After obtaining informed consent, the colonoscope was  ?  passed under direct vision. Throughout the procedure,  ?                       the patient's blood pressure, pulse, and oxygen  ?                       saturations were monitored continuously. The  ?                       Colonoscope was  introduced through the anus and  ?                       advanced to the the cecum, identified by appendiceal  ?                       orifice and ileocecal valve. The colonoscopy was  ?                       performed without difficulty. The patient tolerated  ?                       the procedure well. The quality of the bowel  ?                       preparation was excellent. ?Findings: ?     The perianal and digital rectal examinations were normal. ?     Two sessile polyps were found in the descending colon. The polyps were 2  ?     to 4 mm in size. These polyps were removed with a cold snare. Resection  ?     and retrieval were complete. ?     A 3 mm polyp was found in the sigmoid colon. The polyp was sessile. The  ?     polyp was removed with a cold snare. Resection and retrieval were  ?     complete. ?     Non-bleeding internal hemorrhoids were found during retroflexion. The  ?     hemorrhoids were Grade I (internal hemorrhoids that do not prolapse). ?Impression:            - Two 2 to 4 mm polyps in the descending colon,  ?                       removed with a cold snare. Resected and retrieved. ?                       - One 3 mm polyp in the sigmoid colon, removed with a  ?                       cold snare. Resected and retrieved. ?                       - Non-bleeding internal hemorrhoids. ?Recommendation:        - Discharge patient to home. ?                       - Resume previous diet. ?                       - Continue present medications. ?                       -  Await pathology results. ?                       - Repeat colonoscopy in 5 years for surveillance. ?Procedure Code(s):     --- Professional --- ?                       [phone], Colonoscopy, flexible; with removal of  ?                       tumor(s), polyp(s), or other lesion(s) by snare  ?                       technique ?Diagnosis Code(s):     --- Professional --- ?                       Z86.010, Personal history of colonic polyps ?                        K63.5, Polyp of colon ?CPT copyright 2019 [employer] Medical Association. All rights reserved. ?The codes documented in this report are preliminary and upon coder review may  ?be revised to meet current compliance requirements. ?Lucilla Lame MD, MD ?12/04/2021 9:11:01 AM ?This report has been signed electronically. ?Number of Addenda: 0 ?Note Initiated On: 12/04/2021 8:44 AM ?Scope Withdrawal Time: 0 hours 9 minutes 51 seconds  ?Total Procedure Duration: 0 hours 12 minutes 51 seconds  ?Estimated Blood Loss:  Estimated blood loss: none. ?     [facility] ?

## 2021-12-04 NOTE — Transfer of Care (Signed)
Immediate Anesthesia Transfer of Care Note ? ?Patient: Zachary Holmes ? ?Procedure(s) Performed: COLONOSCOPY WITH BIOPSIES (Rectum) ?POLYPECTOMY (Rectum) ? ?Patient Location: PACU ? ?Anesthesia Type: General ? ?Level of Consciousness: awake, alert  and patient cooperative ? ?Airway and Oxygen Therapy: Patient Spontanous Breathing and Patient connected to supplemental oxygen ? ?Post-op Assessment: Post-op Vital signs reviewed, Patient's Cardiovascular Status Stable, Respiratory Function Stable, Patent Airway and No signs of Nausea or vomiting ? ?Post-op Vital Signs: Reviewed and stable ? ?Complications: No notable events documented. ? ?

## 2021-12-04 NOTE — H&P (Signed)
? ?Lucilla Lame, MD [facility] ?[address] ?[place], [place] [address] ?Phone:[phone] ?Fax : [phone] ? ?Primary Care Physician:  [name], PA-C ?Primary Gastroenterologist:  Dr. Allen Norris ? ?Pre-Procedure History & Physical: ?HPI:  Zachary Holmes is a 61 y.o. male is here for an colonoscopy. ?  ?Past Medical History:  ?Diagnosis Date  ? Anxiety   ? Arthritis   ? "legs" (09/20/2014)  ? Depression   ? GERD (gastroesophageal reflux disease)   ? Hypertension   ? controlled on meds  ? OA (osteoarthritis) of shoulder   ? right  ? Seizures ([place]) 1983  ? x1, S/P head injury  ? Serratia marcescens infection 2011  ? left knee  ? Umbilical hernia   ? Wears dentures   ? partial upper  ? ? ?Past Surgical History:  ?Procedure Laterality Date  ? ANKLE FUSION Right 11/24/2007  ? Archie Endo 12/04/2010  ? COLONOSCOPY WITH PROPOFOL N/A 05/13/2017  ? Procedure: COLONOSCOPY WITH PROPOFOL;  Surgeon: Lucilla Lame, MD;  Location: [facility];  Service: Endoscopy;  Laterality: N/A;  requests early  ? FRACTURE SURGERY    ? JOINT REPLACEMENT    ? ankle-right, knee-left, both shoulders  ? KNEE ARTHROCENTESIS Left X 5  ? "got infected . . ."  ? MULTIPLE TOOTH EXTRACTIONS    ? SHOULDER ARTHROSCOPY Left 06/2007  ? Archie Endo 12/03/2010  ? TOTAL KNEE ARTHROPLASTY Left 2008  ? Archie Endo 04/09/2010  ? TOTAL SHOULDER ARTHROPLASTY Left 09/20/2014  ? TOTAL SHOULDER ARTHROPLASTY Left 09/20/2014  ? Procedure: TOTAL SHOULDER ARTHROPLASTY;  Surgeon: Nita Sells, MD;  Location: [facility];  Service: Orthopedics;  Laterality: Left;  Left total shoulder arthroplasty  ? TOTAL SHOULDER ARTHROPLASTY Right 06/02/2018  ? Procedure: RIGHT TOTAL SHOULDER ARTHROPLASTY;  Surgeon: Tania Ade, MD;  Location: [facility];  Service: Orthopedics;  Laterality: Right;  ? ? ?Prior to Admission medications   ?Medication Sig Start Date End Date Taking? Authorizing Provider  ?amLODipine (NORVASC) 5 MG tablet Take 1 tablet (5 mg total) by mouth daily. 07/28/21   Yes McDonough, Lauren K, PA-C  ?atorvastatin (LIPITOR) 10 MG tablet Take 10 mg by mouth daily. 10/15/21  Yes [provider]  ?buPROPion (WELLBUTRIN XL) 150 MG 24 hr tablet TAKE ONE TAB PO QD IN AM  FOR ANXIETY 07/28/21  Yes McDonough, Lauren K, PA-C  ?cyclobenzaprine (FLEXERIL) 10 MG tablet TAKE 1 TABLET BY MOUTH EVERY NIGHT AT BEDTIME FOR MUSCLE SPASM 11/16/21  Yes McDonough, Lauren K, PA-C  ?ezetimibe (ZETIA) 10 MG tablet Take 1 tablet (10 mg total) by mouth daily. 04/25/21  Yes McDonough, Lauren K, PA-C  ?omeprazole (PRILOSEC) 40 MG capsule Take 1 capsule (40 mg total) by mouth daily. 07/28/21  Yes McDonough, Lauren K, PA-C  ?traMADol (ULTRAM) 50 MG tablet TAKE 1 TABLET BY MOUTH TWICE DAILY AS NEEDED FOR PAIN 08/25/21  Yes McDonough, Si Gaul, PA-C  ?ALPRAZolam (XANAX) 0.5 MG tablet TAKE 1 TABLET BY MOUTH EVERY DAY AS NEEDED FOR ANXIETY 08/25/21   McDonough, Si Gaul, PA-C  ? ? ?Allergies as of 11/04/2021  ? (No Known Allergies)  ? ? ?Family History  ?Problem Relation Age of Onset  ? COPD Mother   ? Heart disease Father   ? ? ?Social History  ? ?Socioeconomic History  ? Marital status: Single  ?  Spouse name: Not on file  ? Number of children: Not on file  ? Years of education: Not on file  ? Highest education level: Not on file  ?  Occupational History  ? Not on file  ?Tobacco Use  ? Smoking status: Every Day  ?  Packs/day: 0.50  ?  Years: 20.00  ?  Pack years: 10.00  ?  Types: Cigarettes  ? Smokeless tobacco: Never  ? Tobacco comments:  ?  Half pack daily  ?Vaping Use  ? Vaping Use: Never used  ?Substance and Sexual Activity  ? Alcohol use: Not Currently  ?  Alcohol/week: 12.0 standard drinks  ?  Types: 12 Cans of beer per week  ?  Comment: SOBER SINCE 05/17/2017  ? Drug use: Yes  ?  Types: Marijuana  ?  Comment: DAILY  ? Sexual activity: Not Currently  ?Other Topics Concern  ? Not on file  ?Social History Narrative  ? Not on file  ? ?Social Determinants of Health  ? ?Financial Resource Strain: Low Risk   ?  Difficulty of Paying Living Expenses: Not very hard  ?Food Insecurity: Not on file  ?Transportation Needs: Not on file  ?Physical Activity: Not on file  ?Stress: Not on file  ?Social Connections: Not on file  ?Intimate Partner Violence: Not on file  ? ? ?Review of Systems: ?See HPI, otherwise negative ROS ? ?Physical Exam: ?BP (!) 134/92   Pulse 71   Temp (!) 97.5 ?F (36.4 ?C) (Temporal)   Resp 18   Ht 6' (1.829 m)   Wt 117.9 kg   SpO2 96%   BMI 35.26 kg/m?  ?General:   Alert,  pleasant and cooperative in NAD ?Head:  Normocephalic and atraumatic. ?Neck:  Supple; no masses or thyromegaly. ?Lungs:  Clear throughout to auscultation.    ?Heart:  Regular rate and rhythm. ?Abdomen:  Soft, nontender and nondistended. Normal bowel sounds, without guarding, and without rebound.   ?Neurologic:  Alert and  oriented x4;  grossly normal neurologically. ? ?Impression/Plan: ?Zachary Holmes is here for an colonoscopy to be performed for a history of adenomatous polyps on 2018 ? ? ?Risks, benefits, limitations, and alternatives regarding  colonoscopy have been reviewed with the patient.  Questions have been answered.  All parties agreeable. ? ? ?Lucilla Lame, MD  12/04/2021, 8:17 AM ?

## 2021-12-05 ENCOUNTER — Encounter: Payer: Self-pay | Admitting: Gastroenterology

## 2021-12-05 LAB — SURGICAL PATHOLOGY

## 2021-12-28 ENCOUNTER — Other Ambulatory Visit: Payer: Self-pay | Admitting: Internal Medicine

## 2021-12-28 DIAGNOSIS — F411 Generalized anxiety disorder: Secondary | ICD-10-CM

## 2022-01-13 ENCOUNTER — Other Ambulatory Visit: Payer: Self-pay | Admitting: Physician Assistant

## 2022-01-22 ENCOUNTER — Encounter: Payer: Self-pay | Admitting: Physician Assistant

## 2022-01-22 ENCOUNTER — Ambulatory Visit (INDEPENDENT_AMBULATORY_CARE_PROVIDER_SITE_OTHER): Payer: Medicare HMO | Admitting: Physician Assistant

## 2022-01-22 ENCOUNTER — Other Ambulatory Visit: Payer: Self-pay | Admitting: Internal Medicine

## 2022-01-22 DIAGNOSIS — I10 Essential (primary) hypertension: Secondary | ICD-10-CM | POA: Diagnosis not present

## 2022-01-22 DIAGNOSIS — G8928 Other chronic postprocedural pain: Secondary | ICD-10-CM | POA: Diagnosis not present

## 2022-01-22 DIAGNOSIS — R7303 Prediabetes: Secondary | ICD-10-CM

## 2022-01-22 LAB — POCT GLYCOSYLATED HEMOGLOBIN (HGB A1C): Hemoglobin A1C: 5.9 % — AB (ref 4.0–5.6)

## 2022-01-22 MED ORDER — TRAMADOL HCL 50 MG PO TABS: 50.0000 mg | ORAL_TABLET | Freq: Two times a day (BID) | ORAL | 1 refills | Status: DC | PRN

## 2022-01-22 NOTE — Progress Notes (Signed)
[facility] [place], [place] [address]  Internal MEDICINE  Office Visit Note  Patient Name: Zachary Holmes  [id number]  [id number]  Date of Service: 02/03/2022  Chief Complaint  Patient presents with   Follow-up   Depression   Gastroesophageal Reflux   Hypertension   Quality Metric Gaps    Tetanus and Shingles Vaccine    HPI Pt is here for routine follow up -Down 7lbs since last visit -Had colonoscopy and it went well.  Did have several polyps and is supposed to go back in 5years for repeat. -smoking 1/2 ppd and is taking wellbutrin to try to help this -Still taking zetia, wellbutrin and omeprazole, but admits he has not been taking his BP med when he ran out and didn't pick up refill -Recheck BP not improved with 142/96, but did not take BP meds and will restart now  Current Medication: Outpatient Encounter Medications as of 01/22/2022  Medication Sig   atorvastatin (LIPITOR) 10 MG tablet TAKE 1 TABLET(10 MG) BY MOUTH DAILY   buPROPion (WELLBUTRIN XL) 150 MG 24 hr tablet TAKE 1 TABLET BY MOUTH EVERY DAY IN THE MORNING FOR ANXIETY   cyclobenzaprine (FLEXERIL) 10 MG tablet TAKE 1 TABLET BY MOUTH EVERY NIGHT AT BEDTIME FOR MUSCLE SPASM   ezetimibe (ZETIA) 10 MG tablet Take 1 tablet (10 mg total) by mouth daily.   omeprazole (PRILOSEC) 40 MG capsule Take 1 capsule (40 mg total) by mouth daily.   [DISCONTINUED] ALPRAZolam (XANAX) 0.5 MG tablet TAKE 1 TABLET BY MOUTH EVERY DAY AS NEEDED FOR ANXIETY   [DISCONTINUED] amLODipine (NORVASC) 5 MG tablet Take 1 tablet (5 mg total) by mouth daily.   [DISCONTINUED] traMADol (ULTRAM) 50 MG tablet TAKE 1 TABLET BY MOUTH TWICE DAILY AS NEEDED FOR PAIN   traMADol (ULTRAM) 50 MG tablet Take 1 tablet (50 mg total) by mouth 2 (two) times daily as needed. for pain   No facility-administered encounter medications on file as of 01/22/2022.    Surgical History: Past Surgical History:  Procedure Laterality Date   ANKLE  FUSION Right 11/24/2007   Archie Endo 12/04/2010   COLONOSCOPY WITH PROPOFOL N/A 05/13/2017   Procedure: COLONOSCOPY WITH PROPOFOL;  Surgeon: Lucilla Lame, MD;  Location: [facility];  Service: Endoscopy;  Laterality: N/A;  requests early   COLONOSCOPY WITH PROPOFOL N/A 12/04/2021   Procedure: COLONOSCOPY WITH BIOPSIES;  Surgeon: Lucilla Lame, MD;  Location: [facility];  Service: Endoscopy;  Laterality: N/A;   FRACTURE SURGERY     JOINT REPLACEMENT     ankle-right, knee-left, both shoulders   KNEE ARTHROCENTESIS Left X 5   "got infected . . ."   MULTIPLE TOOTH EXTRACTIONS     POLYPECTOMY N/A 12/04/2021   Procedure: POLYPECTOMY;  Surgeon: Lucilla Lame, MD;  Location: [facility];  Service: Endoscopy;  Laterality: N/A;   SHOULDER ARTHROSCOPY Left 06/2007   /notes 12/03/2010   TOTAL KNEE ARTHROPLASTY Left 2008   /notes 04/09/2010   TOTAL SHOULDER ARTHROPLASTY Left 09/20/2014   TOTAL SHOULDER ARTHROPLASTY Left 09/20/2014   Procedure: TOTAL SHOULDER ARTHROPLASTY;  Surgeon: Nita Sells, MD;  Location: [facility];  Service: Orthopedics;  Laterality: Left;  Left total shoulder arthroplasty   TOTAL SHOULDER ARTHROPLASTY Right 06/02/2018   Procedure: RIGHT TOTAL SHOULDER ARTHROPLASTY;  Surgeon: Tania Ade, MD;  Location: [facility];  Service: Orthopedics;  Laterality: Right;    Medical History: Past Medical History:  Diagnosis Date   Anxiety    Arthritis    "  legs" (09/20/2014)   Depression    GERD (gastroesophageal reflux disease)    Hypertension    controlled on meds   OA (osteoarthritis) of shoulder    right   Seizures ([facility]) 1983   x1, S/P head injury   Serratia marcescens infection 2011   left knee   Umbilical hernia    Wears dentures    partial upper    Family History: Family History  Problem Relation Age of Onset   COPD Mother    Heart disease Father     Social History   Socioeconomic History   Marital status: Single    Spouse name: Not on file    Number of children: Not on file   Years of education: Not on file   Highest education level: Not on file  Occupational History   Not on file  Tobacco Use   Smoking status: Every Day    Packs/day: 0.50    Years: 20.00    Total pack years: 10.00    Types: Cigarettes   Smokeless tobacco: Never   Tobacco comments:    Half pack daily  Vaping Use   Vaping Use: Never used  Substance and Sexual Activity   Alcohol use: Not Currently    Alcohol/week: 12.0 standard drinks of alcohol    Types: 12 Cans of beer per week    Comment: SOBER SINCE 05/17/2017   Drug use: Yes    Types: Marijuana    Comment: DAILY   Sexual activity: Not Currently  Other Topics Concern   Not on file  Social History Narrative   Not on file   Social Determinants of Health   Financial Resource Strain: Low Risk  (04/04/2021)   Overall Financial Resource Strain (CARDIA)    Difficulty of Paying Living Expenses: Not very hard  Food Insecurity: Not on file  Transportation Needs: Not on file  Physical Activity: Not on file  Stress: Not on file  Social Connections: Not on file  Intimate Partner Violence: Not on file      Review of Systems  Constitutional:  Negative for chills, fatigue and unexpected weight change.  HENT:  Negative for congestion, postnasal drip, rhinorrhea, sneezing and sore throat.   Eyes:  Negative for redness.  Respiratory:  Negative for cough, chest tightness and shortness of breath.   Cardiovascular:  Negative for chest pain and palpitations.  Gastrointestinal:  Negative for abdominal pain, constipation, diarrhea, nausea and vomiting.  Genitourinary:  Negative for dysuria and frequency.  Musculoskeletal:  Positive for arthralgias, back pain and myalgias. Negative for joint swelling and neck pain.  Skin:  Negative for rash.  Neurological: Negative.  Negative for tremors and numbness.  Hematological:  Negative for adenopathy. Does not bruise/bleed easily.  Psychiatric/Behavioral:  Negative  for behavioral problems (Depression), sleep disturbance and suicidal ideas. The patient is nervous/anxious.     Vital Signs: BP (!) 131/95   Pulse (!) 104   Temp (!) 97 F (36.1 C)   Resp 16   Ht 6' (1.829 m)   Wt 266 lb 12.8 oz (121 kg)   SpO2 97%   BMI 36.18 kg/m    Physical Exam Vitals and nursing note reviewed.  Constitutional:      General: He is not in acute distress.    Appearance: He is well-developed. He is obese. He is not diaphoretic.  HENT:     Head: Normocephalic and atraumatic.     Mouth/Throat:     Pharynx: No oropharyngeal exudate.  Eyes:  Pupils: Pupils are equal, round, and reactive to light.  Neck:     Thyroid: No thyromegaly.     Vascular: No JVD.     Trachea: No tracheal deviation.  Cardiovascular:     Rate and Rhythm: Normal rate and regular rhythm.     Heart sounds: Normal heart sounds. No murmur heard.    No friction rub. No gallop.  Pulmonary:     Effort: Pulmonary effort is normal. No respiratory distress.     Breath sounds: No wheezing or rales.  Chest:     Chest wall: No tenderness.  Abdominal:     General: Bowel sounds are normal.     Palpations: Abdomen is soft.  Musculoskeletal:        General: Normal range of motion.     Cervical back: Normal range of motion and neck supple.     Comments: Chronic pain throughout legs, especially knees and back with movement  Lymphadenopathy:     Cervical: No cervical adenopathy.  Skin:    General: Skin is warm and dry.  Neurological:     Mental Status: He is alert and oriented to person, place, and time.     Cranial Nerves: No cranial nerve deficit.  Psychiatric:        Behavior: Behavior normal.        Thought Content: Thought content normal.        Judgment: Judgment normal.        Assessment/Plan: 1. Prediabetes - POCT HgB A1C is 5.9 and stable, continue to work on diet and exercise  2. Essential hypertension, benign Elevated in office due to patient not taking his medication  and will restart now  3. Other chronic postprocedural pain May continue tramadol as needed - traMADol (ULTRAM) 50 MG tablet; Take 1 tablet (50 mg total) by mouth 2 (two) times daily as needed. for pain  Dispense: 60 tablet; Refill: 1   General Counseling: ishmael berkovich understanding of the findings of todays visit and agrees with plan of treatment. I have discussed any further diagnostic evaluation that may be needed or ordered today. We also reviewed his medications today. he has been encouraged to call the office with any questions or concerns that should arise related to todays visit.    Orders Placed This Encounter  Procedures   POCT HgB A1C    Meds ordered this encounter  Medications   traMADol (ULTRAM) 50 MG tablet    Sig: Take 1 tablet (50 mg total) by mouth 2 (two) times daily as needed. for pain    Dispense:  60 tablet    Refill:  1    This patient was seen by Drema Dallas, PA-C in collaboration with Dr. Clayborn Bigness as a part of collaborative care agreement.   Total time spent:30 Minutes Time spent includes review of chart, medications, test results, and follow up plan with the patient.      Dr Lavera Guise Internal medicine

## 2022-01-27 ENCOUNTER — Other Ambulatory Visit: Payer: Self-pay | Admitting: Physician Assistant

## 2022-01-27 DIAGNOSIS — F411 Generalized anxiety disorder: Secondary | ICD-10-CM

## 2022-01-28 ENCOUNTER — Telehealth: Payer: Medicare HMO

## 2022-02-05 ENCOUNTER — Other Ambulatory Visit: Payer: Self-pay | Admitting: Physician Assistant

## 2022-02-05 DIAGNOSIS — K219 Gastro-esophageal reflux disease without esophagitis: Secondary | ICD-10-CM

## 2022-02-20 ENCOUNTER — Other Ambulatory Visit: Payer: Self-pay | Admitting: Physician Assistant

## 2022-02-20 DIAGNOSIS — M62838 Other muscle spasm: Secondary | ICD-10-CM

## 2022-04-04 ENCOUNTER — Other Ambulatory Visit: Payer: Self-pay | Admitting: Physician Assistant

## 2022-04-23 ENCOUNTER — Ambulatory Visit (INDEPENDENT_AMBULATORY_CARE_PROVIDER_SITE_OTHER): Payer: Medicare HMO | Admitting: Physician Assistant

## 2022-04-23 ENCOUNTER — Encounter: Payer: Self-pay | Admitting: Physician Assistant

## 2022-04-23 DIAGNOSIS — I10 Essential (primary) hypertension: Secondary | ICD-10-CM | POA: Diagnosis not present

## 2022-04-23 DIAGNOSIS — Z125 Encounter for screening for malignant neoplasm of prostate: Secondary | ICD-10-CM

## 2022-04-23 DIAGNOSIS — K219 Gastro-esophageal reflux disease without esophagitis: Secondary | ICD-10-CM

## 2022-04-23 DIAGNOSIS — R7303 Prediabetes: Secondary | ICD-10-CM

## 2022-04-23 DIAGNOSIS — G8928 Other chronic postprocedural pain: Secondary | ICD-10-CM

## 2022-04-23 DIAGNOSIS — E782 Mixed hyperlipidemia: Secondary | ICD-10-CM | POA: Diagnosis not present

## 2022-04-23 DIAGNOSIS — M62838 Other muscle spasm: Secondary | ICD-10-CM | POA: Diagnosis not present

## 2022-04-23 DIAGNOSIS — R5383 Other fatigue: Secondary | ICD-10-CM | POA: Diagnosis not present

## 2022-04-23 DIAGNOSIS — F411 Generalized anxiety disorder: Secondary | ICD-10-CM

## 2022-04-23 LAB — POCT GLYCOSYLATED HEMOGLOBIN (HGB A1C): Hemoglobin A1C: 5.9 % — AB (ref 4.0–5.6)

## 2022-04-23 MED ORDER — BUPROPION HCL ER (XL) 150 MG PO TB24: ORAL_TABLET | ORAL | 1 refills | Status: DC

## 2022-04-23 MED ORDER — EZETIMIBE 10 MG PO TABS: 10.0000 mg | ORAL_TABLET | Freq: Every day | ORAL | 3 refills | Status: DC

## 2022-04-23 MED ORDER — ALPRAZOLAM 0.5 MG PO TABS: ORAL_TABLET | ORAL | 1 refills | Status: DC

## 2022-04-23 MED ORDER — CYCLOBENZAPRINE HCL 10 MG PO TABS: ORAL_TABLET | ORAL | 2 refills | Status: DC

## 2022-04-23 MED ORDER — TRAMADOL HCL 50 MG PO TABS: 50.0000 mg | ORAL_TABLET | Freq: Two times a day (BID) | ORAL | 1 refills | Status: DC | PRN

## 2022-04-23 MED ORDER — OMEPRAZOLE 40 MG PO CPDR: DELAYED_RELEASE_CAPSULE | ORAL | 1 refills | Status: DC

## 2022-04-23 NOTE — Progress Notes (Signed)
[facility] [place], [place] [address]  Internal MEDICINE  Office Visit Note  Patient Name: Zachary Holmes  [id number]  [id number]  Date of Service: 04/28/2022  Chief Complaint  Patient presents with   Follow-up    Follow up A1c   Depression   Hypertension    HPI Pt is here for routine follow up and has no new complaints today -Taking BP meds again and Bp stable  -Does need refills today -tolerating zetia after previous statin intolerance -Has needed a little more tramadol recently. He has been avoiding further surgeries, but will see how he is feeling, if significant worsening may need re-evaluation. He usually only takes on an as needed basis. -Will be traveling up [place] to spend Thanksgiving with his sister -will order fasting labs prior to CPE  Current Medication: Outpatient Encounter Medications as of 04/23/2022  Medication Sig   amLODipine (NORVASC) 5 MG tablet TAKE 1 TABLET(5 MG) BY MOUTH DAILY   [DISCONTINUED] ALPRAZolam (XANAX) 0.5 MG tablet TAKE 1 TABLET BY MOUTH EVERY DAY AS NEEDED FOR ANXIETY   [DISCONTINUED] atorvastatin (LIPITOR) 10 MG tablet TAKE 1 TABLET(10 MG) BY MOUTH DAILY   [DISCONTINUED] buPROPion (WELLBUTRIN XL) 150 MG 24 hr tablet TAKE 1 TABLET BY MOUTH EVERY DAY IN THE MORNING FOR ANXIETY   [DISCONTINUED] cyclobenzaprine (FLEXERIL) 10 MG tablet TAKE 1 TABLET BY MOUTH EVERY NIGHT AT BEDTIME FOR MUSCLE SPASM   [DISCONTINUED] ezetimibe (ZETIA) 10 MG tablet Take 1 tablet (10 mg total) by mouth daily.   [DISCONTINUED] omeprazole (PRILOSEC) 40 MG capsule TAKE 1 CAPSULE(40 MG) BY MOUTH DAILY   [DISCONTINUED] traMADol (ULTRAM) 50 MG tablet Take 1 tablet (50 mg total) by mouth 2 (two) times daily as needed. for pain   ALPRAZolam (XANAX) 0.5 MG tablet Take 1 tablet by mouth daily as needed for anxiety   buPROPion (WELLBUTRIN XL) 150 MG 24 hr tablet TAKE 1 TABLET BY MOUTH EVERY DAY IN THE MORNING FOR ANXIETY   cyclobenzaprine (FLEXERIL)  10 MG tablet Take 1 tablet by mouth at bedtime as needed   ezetimibe (ZETIA) 10 MG tablet Take 1 tablet (10 mg total) by mouth daily.   omeprazole (PRILOSEC) 40 MG capsule TAKE 1 CAPSULE(40 MG) BY MOUTH DAILY   traMADol (ULTRAM) 50 MG tablet Take 1 tablet (50 mg total) by mouth 2 (two) times daily as needed. for pain   No facility-administered encounter medications on file as of 04/23/2022.    Surgical History: Past Surgical History:  Procedure Laterality Date   ANKLE FUSION Right 11/24/2007   Archie Endo 12/04/2010   COLONOSCOPY WITH PROPOFOL N/A 05/13/2017   Procedure: COLONOSCOPY WITH PROPOFOL;  Surgeon: Lucilla Lame, MD;  Location: [facility];  Service: Endoscopy;  Laterality: N/A;  requests early   COLONOSCOPY WITH PROPOFOL N/A 12/04/2021   Procedure: COLONOSCOPY WITH BIOPSIES;  Surgeon: Lucilla Lame, MD;  Location: [facility];  Service: Endoscopy;  Laterality: N/A;   FRACTURE SURGERY     JOINT REPLACEMENT     ankle-right, knee-left, both shoulders   KNEE ARTHROCENTESIS Left X 5   "got infected . . ."   MULTIPLE TOOTH EXTRACTIONS     POLYPECTOMY N/A 12/04/2021   Procedure: POLYPECTOMY;  Surgeon: Lucilla Lame, MD;  Location: [facility];  Service: Endoscopy;  Laterality: N/A;   SHOULDER ARTHROSCOPY Left 06/2007   /notes 12/03/2010   TOTAL KNEE ARTHROPLASTY Left 2008   /notes 04/09/2010   TOTAL SHOULDER ARTHROPLASTY Left 09/20/2014   TOTAL SHOULDER  ARTHROPLASTY Left 09/20/2014   Procedure: TOTAL SHOULDER ARTHROPLASTY;  Surgeon: Nita Sells, MD;  Location: [facility];  Service: Orthopedics;  Laterality: Left;  Left total shoulder arthroplasty   TOTAL SHOULDER ARTHROPLASTY Right 06/02/2018   Procedure: RIGHT TOTAL SHOULDER ARTHROPLASTY;  Surgeon: Tania Ade, MD;  Location: [facility];  Service: Orthopedics;  Laterality: Right;    Medical History: Past Medical History:  Diagnosis Date   Anxiety    Arthritis    "legs" (09/20/2014)   Depression    GERD  (gastroesophageal reflux disease)    Hypertension    controlled on meds   OA (osteoarthritis) of shoulder    right   Seizures (HCC) 1983   x1, S/P head injury   Serratia marcescens infection 2011   left knee   Umbilical hernia    Wears dentures    partial upper    Family History: Family History  Problem Relation Age of Onset   COPD Mother    Heart disease Father     Social History   Socioeconomic History   Marital status: Single    Spouse name: Not on file   Number of children: Not on file   Years of education: Not on file   Highest education level: Not on file  Occupational History   Not on file  Tobacco Use   Smoking status: Every Day    Packs/day: 0.50    Years: 20.00    Total pack years: 10.00    Types: Cigarettes   Smokeless tobacco: Never   Tobacco comments:    Half pack daily  Vaping Use   Vaping Use: Never used  Substance and Sexual Activity   Alcohol use: Not Currently    Alcohol/week: 12.0 standard drinks of alcohol    Types: 12 Cans of beer per week    Comment: SOBER SINCE 05/17/2017   Drug use: Yes    Types: Marijuana    Comment: DAILY   Sexual activity: Not Currently  Other Topics Concern   Not on file  Social History Narrative   Not on file   Social Determinants of Health   Financial Resource Strain: Low Risk  (04/04/2021)   Overall Financial Resource Strain (CARDIA)    Difficulty of Paying Living Expenses: Not very hard  Food Insecurity: Not on file  Transportation Needs: Not on file  Physical Activity: Not on file  Stress: Not on file  Social Connections: Not on file  Intimate Partner Violence: Not on file      Review of Systems  Constitutional:  Negative for chills, fatigue and unexpected weight change.  HENT:  Negative for congestion, postnasal drip, rhinorrhea, sneezing and sore throat.   Eyes:  Negative for redness.  Respiratory:  Negative for cough, chest tightness and shortness of breath.   Cardiovascular:  Negative for  chest pain and palpitations.  Gastrointestinal:  Negative for abdominal pain, constipation, diarrhea, nausea and vomiting.  Genitourinary:  Negative for dysuria and frequency.  Musculoskeletal:  Positive for arthralgias, back pain and myalgias. Negative for joint swelling and neck pain.  Skin:  Negative for rash.  Neurological: Negative.  Negative for tremors and numbness.  Hematological:  Negative for adenopathy. Does not bruise/bleed easily.  Psychiatric/Behavioral:  Negative for behavioral problems (Depression), sleep disturbance and suicidal ideas. The patient is nervous/anxious.     Vital Signs: BP 130/82   Pulse 82   Temp 97.8 F (36.6 C)   Resp 16   Ht 6' (1.829 m)   Wt 274  lb (124.3 kg)   SpO2 98%   BMI 37.16 kg/m    Physical Exam Vitals and nursing note reviewed.  Constitutional:      General: He is not in acute distress.    Appearance: He is well-developed. He is obese. He is not diaphoretic.  HENT:     Head: Normocephalic and atraumatic.     Mouth/Throat:     Pharynx: No oropharyngeal exudate.  Eyes:     Pupils: Pupils are equal, round, and reactive to light.  Neck:     Thyroid: No thyromegaly.     Vascular: No JVD.     Trachea: No tracheal deviation.  Cardiovascular:     Rate and Rhythm: Normal rate and regular rhythm.     Heart sounds: Normal heart sounds. No murmur heard.    No friction rub. No gallop.  Pulmonary:     Effort: Pulmonary effort is normal. No respiratory distress.     Breath sounds: No wheezing or rales.  Chest:     Chest wall: No tenderness.  Abdominal:     General: Bowel sounds are normal.     Palpations: Abdomen is soft.  Musculoskeletal:        General: Normal range of motion.     Cervical back: Normal range of motion and neck supple.     Comments: Chronic pain throughout legs, especially knees and back with movement  Lymphadenopathy:     Cervical: No cervical adenopathy.  Skin:    General: Skin is warm and dry.  Neurological:      Mental Status: He is alert and oriented to person, place, and time.     Cranial Nerves: No cranial nerve deficit.  Psychiatric:        Behavior: Behavior normal.        Thought Content: Thought content normal.        Judgment: Judgment normal.        Assessment/Plan: 1. Essential hypertension, benign Stable, continue current medications  2. Prediabetes - POCT glycosylated hemoglobin (Hb A1C) is stable at 5.9. Continue to work on diet and exericse  3. GAD (generalized anxiety disorder) May continue wellbutrin as before and take xanax only as needed - buPROPion (WELLBUTRIN XL) 150 MG 24 hr tablet; TAKE 1 TABLET BY MOUTH EVERY DAY IN THE MORNING FOR ANXIETY  Dispense: 90 tablet; Refill: 1 - ALPRAZolam (XANAX) 0.5 MG tablet; Take 1 tablet by mouth daily as needed for anxiety  Dispense: 30 tablet; Refill: 1  4. Muscle spasm - cyclobenzaprine (FLEXERIL) 10 MG tablet; Take 1 tablet by mouth at bedtime as needed  Dispense: 30 tablet; Refill: 2  5. Mixed hyperlipidemia Continue zetia, intolerant to statins. Will update labs - ezetimibe (ZETIA) 10 MG tablet; Take 1 tablet (10 mg total) by mouth daily.  Dispense: 90 tablet; Refill: 3 - Lipid Panel With LDL/HDL Ratio  6. Gastroesophageal reflux disease without esophagitis - omeprazole (PRILOSEC) 40 MG capsule; TAKE 1 CAPSULE(40 MG) BY MOUTH DAILY  Dispense: 90 capsule; Refill: 1  7. Other chronic postprocedural pain May continue tramadol as needed - traMADol (ULTRAM) 50 MG tablet; Take 1 tablet (50 mg total) by mouth 2 (two) times daily as needed. for pain  Dispense: 60 tablet; Refill: 1  Controlled Substance Database was reviewed by me for overdose risk score (ORS)  8. Screening for prostate cancer - PSA Total (Reflex To Free)  9. Other fatigue - CBC w/Diff/Platelet - Comprehensive metabolic panel - TSH + free T4 - Lipid Panel  With LDL/HDL Ratio   General Counseling: montrey buist understanding of the findings of  todays visit and agrees with plan of treatment. I have discussed any further diagnostic evaluation that may be needed or ordered today. We also reviewed his medications today. he has been encouraged to call the office with any questions or concerns that should arise related to todays visit.    Orders Placed This Encounter  Procedures   CBC w/Diff/Platelet   Comprehensive metabolic panel   TSH + free T4   Lipid Panel With LDL/HDL Ratio   PSA Total (Reflex To Free)   POCT glycosylated hemoglobin (Hb A1C)    Meds ordered this encounter  Medications   buPROPion (WELLBUTRIN XL) 150 MG 24 hr tablet    Sig: TAKE 1 TABLET BY MOUTH EVERY DAY IN THE MORNING FOR ANXIETY    Dispense:  90 tablet    Refill:  1   cyclobenzaprine (FLEXERIL) 10 MG tablet    Sig: Take 1 tablet by mouth at bedtime as needed    Dispense:  30 tablet    Refill:  2   ezetimibe (ZETIA) 10 MG tablet    Sig: Take 1 tablet (10 mg total) by mouth daily.    Dispense:  90 tablet    Refill:  3   omeprazole (PRILOSEC) 40 MG capsule    Sig: TAKE 1 CAPSULE(40 MG) BY MOUTH DAILY    Dispense:  90 capsule    Refill:  1   traMADol (ULTRAM) 50 MG tablet    Sig: Take 1 tablet (50 mg total) by mouth 2 (two) times daily as needed. for pain    Dispense:  60 tablet    Refill:  1   ALPRAZolam (XANAX) 0.5 MG tablet    Sig: Take 1 tablet by mouth daily as needed for anxiety    Dispense:  30 tablet    Refill:  1    This patient was seen by Drema Dallas, PA-C in collaboration with Dr. Clayborn Bigness as a part of collaborative care agreement.   Total time spent:30 Minutes Time spent includes review of chart, medications, test results, and follow up plan with the patient.      Dr Lavera Guise Internal medicine

## 2022-07-13 ENCOUNTER — Other Ambulatory Visit: Payer: Self-pay | Admitting: Internal Medicine

## 2022-07-13 NOTE — Telephone Encounter (Signed)
Can you please check before not in med list

## 2022-07-17 ENCOUNTER — Ambulatory Visit: Payer: Medicare HMO | Admitting: Physician Assistant

## 2022-07-21 DIAGNOSIS — R5383 Other fatigue: Secondary | ICD-10-CM | POA: Diagnosis not present

## 2022-07-21 DIAGNOSIS — Z125 Encounter for screening for malignant neoplasm of prostate: Secondary | ICD-10-CM | POA: Diagnosis not present

## 2022-07-21 DIAGNOSIS — E782 Mixed hyperlipidemia: Secondary | ICD-10-CM | POA: Diagnosis not present

## 2022-07-22 LAB — COMPREHENSIVE METABOLIC PANEL
ALT: 32 IU/L (ref 0–44)
AST: 49 IU/L — ABNORMAL HIGH (ref 0–40)
Albumin/Globulin Ratio: 1.5 (ref 1.2–2.2)
Albumin: 4.1 g/dL (ref 3.8–4.9)
Alkaline Phosphatase: 121 IU/L (ref 44–121)
BUN/Creatinine Ratio: 15 (ref 9–20)
BUN: 20 mg/dL (ref 6–24)
Bilirubin Total: 0.3 mg/dL (ref 0.0–1.2)
CO2: 21 mmol/L (ref 20–29)
Calcium: 8.8 mg/dL (ref 8.7–10.2)
Chloride: 103 mmol/L (ref 96–106)
Creatinine, Ser: 1.36 mg/dL — ABNORMAL HIGH (ref 0.76–1.27)
Globulin, Total: 2.7 g/dL (ref 1.5–4.5)
Glucose: 113 mg/dL — ABNORMAL HIGH (ref 70–99)
Potassium: 4.2 mmol/L (ref 3.5–5.2)
Sodium: 139 mmol/L (ref 134–144)
Total Protein: 6.8 g/dL (ref 6.0–8.5)
eGFR: 60 mL/min/{1.73_m2} (ref >59)

## 2022-07-22 LAB — TSH+FREE T4
Free T4: 0.96 ng/dL (ref 0.82–1.77)
TSH: 3.29 u[IU]/mL (ref 0.450–4.500)

## 2022-07-22 LAB — CBC WITH DIFFERENTIAL/PLATELET
Basophils Absolute: 0 10*3/uL (ref 0.0–0.2)
Basos: 1 %
EOS (ABSOLUTE): 0.2 10*3/uL (ref 0.0–0.4)
Eos: 4 %
Hematocrit: 47.6 % (ref 37.5–51.0)
Hemoglobin: 16.4 g/dL (ref 13.0–17.7)
Immature Grans (Abs): 0 10*3/uL (ref 0.0–0.1)
Immature Granulocytes: 1 %
Lymphocytes Absolute: 1.6 10*3/uL (ref 0.7–3.1)
Lymphs: 37 %
MCH: 29.5 pg (ref 26.6–33.0)
MCHC: 34.5 g/dL (ref 31.5–35.7)
MCV: 86 fL (ref 79–97)
Monocytes Absolute: 0.4 10*3/uL (ref 0.1–0.9)
Monocytes: 10 %
Neutrophils Absolute: 2.1 10*3/uL (ref 1.4–7.0)
Neutrophils: 47 %
Platelets: 209 10*3/uL (ref 150–450)
RBC: 5.56 x10E6/uL (ref 4.14–5.80)
RDW: 12.7 % (ref 11.6–15.4)
WBC: 4.4 10*3/uL (ref 3.4–10.8)

## 2022-07-22 LAB — LIPID PANEL WITH LDL/HDL RATIO
Cholesterol, Total: 90 mg/dL — ABNORMAL LOW (ref 100–199)
HDL: 27 mg/dL — ABNORMAL LOW (ref >39)
LDL Chol Calc (NIH): 42 mg/dL (ref 0–99)
LDL/HDL Ratio: 1.6 ratio (ref 0.0–3.6)
Triglycerides: 111 mg/dL (ref 0–149)
VLDL Cholesterol Cal: 21 mg/dL (ref 5–40)

## 2022-07-22 LAB — PSA TOTAL (REFLEX TO FREE): Prostate Specific Ag, Serum: 0.4 ng/mL (ref 0.0–4.0)

## 2022-07-31 ENCOUNTER — Other Ambulatory Visit: Payer: Self-pay | Admitting: Physician Assistant

## 2022-07-31 DIAGNOSIS — M62838 Other muscle spasm: Secondary | ICD-10-CM

## 2022-08-07 ENCOUNTER — Ambulatory Visit (INDEPENDENT_AMBULATORY_CARE_PROVIDER_SITE_OTHER): Payer: Medicare HMO | Admitting: Physician Assistant

## 2022-08-07 ENCOUNTER — Encounter: Payer: Self-pay | Admitting: Physician Assistant

## 2022-08-07 VITALS — BP 128/90 | HR 93 | Temp 97.6°F | Resp 16 | Ht 72.0 in | Wt 275.0 lb

## 2022-08-07 DIAGNOSIS — I10 Essential (primary) hypertension: Secondary | ICD-10-CM | POA: Diagnosis not present

## 2022-08-07 DIAGNOSIS — R300 Dysuria: Secondary | ICD-10-CM

## 2022-08-07 DIAGNOSIS — F411 Generalized anxiety disorder: Secondary | ICD-10-CM

## 2022-08-07 DIAGNOSIS — Z0001 Encounter for general adult medical examination with abnormal findings: Secondary | ICD-10-CM | POA: Diagnosis not present

## 2022-08-07 DIAGNOSIS — G8928 Other chronic postprocedural pain: Secondary | ICD-10-CM

## 2022-08-07 DIAGNOSIS — N289 Disorder of kidney and ureter, unspecified: Secondary | ICD-10-CM | POA: Diagnosis not present

## 2022-08-07 MED ORDER — TRAMADOL HCL 50 MG PO TABS: 50.0000 mg | ORAL_TABLET | Freq: Two times a day (BID) | ORAL | 1 refills | Status: DC | PRN

## 2022-08-07 MED ORDER — ALPRAZOLAM 0.5 MG PO TABS: ORAL_TABLET | ORAL | 1 refills | Status: DC

## 2022-08-07 NOTE — Progress Notes (Signed)
[facility] [place], [place] [address]  Internal MEDICINE  Office Visit Note  Patient Name: Zachary Holmes  [id number]  [id number]  Date of Service: 08/12/2022  Chief Complaint  Patient presents with   Medicare Wellness   Depression   Gastroesophageal Reflux   Hypertension   Quality Metric Gaps    TDAP     HPI Pt is here for routine health maintenance examination -left knee been more bothersome and reports it hyperextended and then gave out on him a few weeks ago. He has been taking NSAIDs with tramadol and not drinking much water to avoid getting gup to restroom as much which may be why creatinine spiked on labs. Seeing ortho next week. This knee has already had multiple operations previously -in addition to increased creatinine his AST is also very slightly elevated.  Will recheck both of these in 3 to 4 weeks. -Bp had been doing better and likely up due to pain and didn't improve on recheck.  Otherwise his labs look good and cholesterol is greatly improved. -Request refills of tramadol and Xanax today which she takes as needed -due for tdap  Current Medication: Outpatient Encounter Medications as of 08/07/2022  Medication Sig   amLODipine (NORVASC) 5 MG tablet TAKE 1 TABLET(5 MG) BY MOUTH DAILY   buPROPion (WELLBUTRIN XL) 150 MG 24 hr tablet TAKE 1 TABLET BY MOUTH EVERY DAY IN THE MORNING FOR ANXIETY   cyclobenzaprine (FLEXERIL) 10 MG tablet TAKE 1 TABLET BY MOUTH AT BEDTIME AS NEEDED   ezetimibe (ZETIA) 10 MG tablet Take 1 tablet (10 mg total) by mouth daily.   omeprazole (PRILOSEC) 40 MG capsule TAKE 1 CAPSULE(40 MG) BY MOUTH DAILY   [DISCONTINUED] ALPRAZolam (XANAX) 0.5 MG tablet Take 1 tablet by mouth daily as needed for anxiety   [DISCONTINUED] traMADol (ULTRAM) 50 MG tablet Take 1 tablet (50 mg total) by mouth 2 (two) times daily as needed. for pain   ALPRAZolam (XANAX) 0.5 MG tablet Take 1 tablet by mouth daily as needed for anxiety    traMADol (ULTRAM) 50 MG tablet Take 1 tablet (50 mg total) by mouth 2 (two) times daily as needed. for pain   No facility-administered encounter medications on file as of 08/07/2022.    Surgical History: Past Surgical History:  Procedure Laterality Date   ANKLE FUSION Right 11/24/2007   Archie Endo 12/04/2010   COLONOSCOPY WITH PROPOFOL N/A 05/13/2017   Procedure: COLONOSCOPY WITH PROPOFOL;  Surgeon: Lucilla Lame, MD;  Location: [place];  Service: Endoscopy;  Laterality: N/A;  requests early   COLONOSCOPY WITH PROPOFOL N/A 12/04/2021   Procedure: COLONOSCOPY WITH BIOPSIES;  Surgeon: Lucilla Lame, MD;  Location: [place];  Service: Endoscopy;  Laterality: N/A;   FRACTURE SURGERY     JOINT REPLACEMENT     ankle-right, knee-left, both shoulders   KNEE ARTHROCENTESIS Left X 5   "got infected . . ."   MULTIPLE TOOTH EXTRACTIONS     POLYPECTOMY N/A 12/04/2021   Procedure: POLYPECTOMY;  Surgeon: Lucilla Lame, MD;  Location: [place];  Service: Endoscopy;  Laterality: N/A;   SHOULDER ARTHROSCOPY Left 06/2007   /notes 12/03/2010   TOTAL KNEE ARTHROPLASTY Left 2008   /notes 04/09/2010   TOTAL SHOULDER ARTHROPLASTY Left 09/20/2014   TOTAL SHOULDER ARTHROPLASTY Left 09/20/2014   Procedure: TOTAL SHOULDER ARTHROPLASTY;  Surgeon: Nita Sells, MD;  Location: [place];  Service: Orthopedics;  Laterality: Left;  Left total shoulder arthroplasty   TOTAL SHOULDER  ARTHROPLASTY Right 06/02/2018   Procedure: RIGHT TOTAL SHOULDER ARTHROPLASTY;  Surgeon: Tania Ade, MD;  Location: [place];  Service: Orthopedics;  Laterality: Right;    Medical History: Past Medical History:  Diagnosis Date   Anxiety    Arthritis    "legs" (09/20/2014)   Depression    GERD (gastroesophageal reflux disease)    Hypertension    controlled on meds   OA (osteoarthritis) of shoulder    right   Seizures (HCC) 1983   x1, S/P head injury   Serratia marcescens infection 2011   left knee    Umbilical hernia    Wears dentures    partial upper    Family History: Family History  Problem Relation Age of Onset   COPD Mother    Heart disease Father       Review of Systems  Constitutional:  Negative for chills, fatigue and unexpected weight change.  HENT:  Negative for congestion, postnasal drip, rhinorrhea, sneezing and sore throat.   Eyes:  Negative for redness.  Respiratory:  Negative for cough, chest tightness and shortness of breath.   Cardiovascular:  Negative for chest pain and palpitations.  Gastrointestinal:  Negative for abdominal pain, constipation, diarrhea, nausea and vomiting.  Genitourinary:  Negative for dysuria and frequency.  Musculoskeletal:  Positive for arthralgias and back pain. Negative for joint swelling, myalgias and neck pain.  Skin:  Negative for rash.  Neurological: Negative.  Negative for tremors and numbness.  Hematological:  Negative for adenopathy. Does not bruise/bleed easily.  Psychiatric/Behavioral:  Negative for behavioral problems (Depression), sleep disturbance and suicidal ideas. The patient is nervous/anxious.      Vital Signs: BP (!) 128/90   Pulse 93   Temp 97.6 F (36.4 C)   Resp 16   Ht 6' (1.829 m)   Wt 275 lb (124.7 kg)   SpO2 94%   BMI 37.30 kg/m    Physical Exam Vitals and nursing note reviewed.  Constitutional:      General: He is not in acute distress.    Appearance: He is well-developed. He is obese. He is not diaphoretic.  HENT:     Head: Normocephalic and atraumatic.     Mouth/Throat:     Pharynx: No oropharyngeal exudate.  Eyes:     Pupils: Pupils are equal, round, and reactive to light.  Neck:     Thyroid: No thyromegaly.     Vascular: No JVD.     Trachea: No tracheal deviation.  Cardiovascular:     Rate and Rhythm: Normal rate and regular rhythm.     Heart sounds: Normal heart sounds. No murmur heard.    No friction rub. No gallop.  Pulmonary:     Effort: Pulmonary effort is normal. No  respiratory distress.     Breath sounds: No wheezing or rales.  Chest:     Chest wall: No tenderness.  Abdominal:     General: Bowel sounds are normal.     Palpations: Abdomen is soft.  Musculoskeletal:        General: Normal range of motion.     Cervical back: Normal range of motion and neck supple.     Right lower leg: No edema.     Left lower leg: No edema.     Comments: Chronic pain throughout legs, especially knees and back with movement. Acute worsening of left knee pain.  Lymphadenopathy:     Cervical: No cervical adenopathy.  Skin:    General: Skin is warm and dry.  Neurological:     Mental Status: He is alert and oriented to person, place, and time.     Cranial Nerves: No cranial nerve deficit.  Psychiatric:        Behavior: Behavior normal.        Thought Content: Thought content normal.        Judgment: Judgment normal.      LABS: Recent Results (from the past 2160 hour(s))  CBC w/Diff/Platelet     Status: None   Collection Time: 07/21/22 10:26 AM  Result Value Ref Range   WBC 4.4 3.4 - 10.8 x10E3/uL   RBC 5.56 4.14 - 5.80 x10E6/uL   Hemoglobin 16.4 13.0 - 17.7 g/dL   Hematocrit 47.6 37.5 - 51.0 %   MCV 86 79 - 97 fL   MCH 29.5 26.6 - 33.0 pg   MCHC 34.5 31.5 - 35.7 g/dL   RDW 12.7 11.6 - 15.4 %   Platelets 209 150 - 450 x10E3/uL   Neutrophils 47 Not Estab. %   Lymphs 37 Not Estab. %   Monocytes 10 Not Estab. %   Eos 4 Not Estab. %   Basos 1 Not Estab. %   Neutrophils Absolute 2.1 1.4 - 7.0 x10E3/uL   Lymphocytes Absolute 1.6 0.7 - 3.1 x10E3/uL   Monocytes Absolute 0.4 0.1 - 0.9 x10E3/uL   EOS (ABSOLUTE) 0.2 0.0 - 0.4 x10E3/uL   Basophils Absolute 0.0 0.0 - 0.2 x10E3/uL   Immature Granulocytes 1 Not Estab. %   Immature Grans (Abs) 0.0 0.0 - 0.1 x10E3/uL  Comprehensive metabolic panel     Status: Abnormal   Collection Time: 07/21/22 10:26 AM  Result Value Ref Range   Glucose 113 (H) 70 - 99 mg/dL   BUN 20 6 - 24 mg/dL   Creatinine, Ser 1.36 (H)  0.76 - 1.27 mg/dL   eGFR 60 >59 mL/min/1.73   BUN/Creatinine Ratio 15 9 - 20   Sodium 139 134 - 144 mmol/L   Potassium 4.2 3.5 - 5.2 mmol/L   Chloride 103 96 - 106 mmol/L   CO2 21 20 - 29 mmol/L   Calcium 8.8 8.7 - 10.2 mg/dL   Total Protein 6.8 6.0 - 8.5 g/dL   Albumin 4.1 3.8 - 4.9 g/dL   Globulin, Total 2.7 1.5 - 4.5 g/dL   Albumin/Globulin Ratio 1.5 1.2 - 2.2   Bilirubin Total 0.3 0.0 - 1.2 mg/dL   Alkaline Phosphatase 121 44 - 121 IU/L   AST 49 (H) 0 - 40 IU/L   ALT 32 0 - 44 IU/L  TSH + free T4     Status: None   Collection Time: 07/21/22 10:26 AM  Result Value Ref Range   TSH 3.290 0.450 - 4.500 uIU/mL   Free T4 0.96 0.82 - 1.77 ng/dL  Lipid Panel With LDL/HDL Ratio     Status: Abnormal   Collection Time: 07/21/22 10:26 AM  Result Value Ref Range   Cholesterol, Total 90 (L) 100 - 199 mg/dL   Triglycerides 111 0 - 149 mg/dL   HDL 27 (L) >39 mg/dL   VLDL Cholesterol Cal 21 5 - 40 mg/dL   LDL Chol Calc (NIH) 42 0 - 99 mg/dL   LDL/HDL Ratio 1.6 0.0 - 3.6 ratio    Comment:                                     LDL/HDL Ratio  Men  Women                               1/2 Avg.Risk  1.0    1.5                                   Avg.Risk  3.6    3.2                                2X Avg.Risk  6.2    5.0                                3X Avg.Risk  8.0    6.1   PSA Total (Reflex To Free)     Status: None   Collection Time: 07/21/22 10:26 AM  Result Value Ref Range   Prostate Specific Ag, Serum 0.4 0.0 - 4.0 ng/mL    Comment: Roche ECLIA methodology. According to the American Urological Association, Serum PSA should decrease and remain at undetectable levels after radical prostatectomy. The AUA defines biochemical recurrence as an initial PSA value 0.2 ng/mL or greater followed by a subsequent confirmatory PSA value 0.2 ng/mL or greater. Values obtained with different assay methods or kits cannot be used interchangeably. Results cannot  be interpreted as absolute evidence of the presence or absence of malignant disease.    Reflex Criteria Comment     Comment: The percent free PSA is performed on a reflex basis only when the total PSA is between 4.0 and 10.0 ng/mL.   UA/M w/rflx Culture, Routine     Status: Abnormal   Collection Time: 08/07/22  1:48 PM   Specimen: Urine   Urine  Result Value Ref Range   Specific Gravity, UA 1.022 1.005 - 1.030   pH, UA 5.5 5.0 - 7.5   Color, UA Yellow Yellow   Appearance Ur Turbid (A) Clear   Leukocytes,UA Negative Negative   Protein,UA Negative Negative/Trace   Glucose, UA Negative Negative   Ketones, UA Negative Negative   RBC, UA Negative Negative   Bilirubin, UA Negative Negative   Urobilinogen, Ur 0.2 0.2 - 1.0 mg/dL   Nitrite, UA Negative Negative   Microscopic Examination Comment     Comment: Microscopic follows if indicated.   Microscopic Examination See below:     Comment: Microscopic was indicated and was performed.   Urinalysis Reflex Comment     Comment: This specimen will not reflex to a Urine Culture.  Microscopic Examination     Status: None   Collection Time: 08/07/22  1:48 PM   Urine  Result Value Ref Range   WBC, UA 0-5 0 - 5 /hpf   RBC, Urine 0-2 0 - 2 /hpf   Epithelial Cells (non renal) 0-10 0 - 10 /hpf   Casts None seen None seen /lpf   Bacteria, UA None seen None seen/Few        Assessment/Plan: 1. Encounter for general adult medical examination with abnormal findings CPE performed, routine labs reviewed, UTD on colonoscopy  2. Essential hypertension, benign Borderline in office likely due to pain, continue current medication  3. GAD (generalized anxiety disorder) May continue xanax prn - ALPRAZolam (XANAX) 0.5 MG tablet; Take 1 tablet by mouth daily  as needed for anxiety  Dispense: 30 tablet; Refill: 1 [place] Controlled Substance Database was reviewed by me for overdose risk score (ORS) Reviewed risks and possible side effects associated with  taking opiates, benzodiazepines and other CNS depressants. Combination of these could cause dizziness and drowsiness. Advised patient not to drive or operate machinery when taking these medications, as patient's and other's life can be at risk and will have consequences. Patient verbalized understanding in this matter. Dependence and abuse for these drugs will be monitored closely. A Controlled substance policy and procedure is on file which allows [facility] to order a urine drug screen test at any visit. Patient understands and agrees with the plan  4. Other chronic postprocedural pain Has appt with ortho for acute exacerbation of chronic left knee pain. May continue tramadol prn - traMADol (ULTRAM) 50 MG tablet; Take 1 tablet (50 mg total) by mouth 2 (two) times daily as needed. for pain  Dispense: 60 tablet; Refill: 1  5. Abnormal renal function Will recheck labs to monitor - Comprehensive metabolic panel  6. Dysuria - UA/M w/rflx Culture, Routine   General Counseling: eragon hammond understanding of the findings of todays visit and agrees with plan of treatment. I have discussed any further diagnostic evaluation that may be needed or ordered today. We also reviewed his medications today. he has been encouraged to call the office with any questions or concerns that should arise related to todays visit.    Counseling:    Orders Placed This Encounter  Procedures   Microscopic Examination   UA/M w/rflx Culture, Routine   Comprehensive metabolic panel    Meds ordered this encounter  Medications   ALPRAZolam (XANAX) 0.5 MG tablet    Sig: Take 1 tablet by mouth daily as needed for anxiety    Dispense:  30 tablet    Refill:  1   traMADol (ULTRAM) 50 MG tablet    Sig: Take 1 tablet (50 mg total) by mouth 2 (two) times daily as needed. for pain    Dispense:  60 tablet    Refill:  1    This patient was seen by Drema Dallas, PA-C in collaboration with Dr. Clay[place] Bigness as a part of collaborative care agreement.  Total time spent:35 Minutes  Time spent includes review of chart, medications, test results, and follow up plan with the patient.     Lavera Guise, MD  Internal Medicine

## 2022-08-08 LAB — UA/M W/RFLX CULTURE, ROUTINE
Bilirubin, UA: NEGATIVE
Glucose, UA: NEGATIVE
Ketones, UA: NEGATIVE
Leukocytes,UA: NEGATIVE
Nitrite, UA: NEGATIVE
Protein,UA: NEGATIVE
RBC, UA: NEGATIVE
Specific Gravity, UA: 1.022 (ref 1.005–1.030)
Urobilinogen, Ur: 0.2 mg/dL (ref 0.2–1.0)
pH, UA: 5.5 (ref 5.0–7.5)

## 2022-08-08 LAB — MICROSCOPIC EXAMINATION
Bacteria, UA: NONE SEEN
Casts: NONE SEEN /lpf

## 2022-08-28 ENCOUNTER — Other Ambulatory Visit: Payer: Self-pay

## 2022-08-28 DIAGNOSIS — I10 Essential (primary) hypertension: Secondary | ICD-10-CM

## 2022-08-28 DIAGNOSIS — F411 Generalized anxiety disorder: Secondary | ICD-10-CM

## 2022-08-28 DIAGNOSIS — M62838 Other muscle spasm: Secondary | ICD-10-CM

## 2022-08-28 DIAGNOSIS — K219 Gastro-esophageal reflux disease without esophagitis: Secondary | ICD-10-CM

## 2022-08-28 MED ORDER — AMLODIPINE BESYLATE 5 MG PO TABS: ORAL_TABLET | ORAL | 3 refills | Status: DC

## 2022-08-28 MED ORDER — CYCLOBENZAPRINE HCL 10 MG PO TABS: 10.0000 mg | ORAL_TABLET | Freq: Every evening | ORAL | 2 refills | Status: DC | PRN

## 2022-08-28 MED ORDER — BUPROPION HCL ER (XL) 150 MG PO TB24: ORAL_TABLET | ORAL | 1 refills | Status: DC

## 2022-08-28 MED ORDER — OMEPRAZOLE 40 MG PO CPDR: DELAYED_RELEASE_CAPSULE | ORAL | 1 refills | Status: DC

## 2022-09-04 ENCOUNTER — Other Ambulatory Visit: Payer: Self-pay | Admitting: Physician Assistant

## 2022-09-04 DIAGNOSIS — K219 Gastro-esophageal reflux disease without esophagitis: Secondary | ICD-10-CM

## 2022-09-18 ENCOUNTER — Ambulatory Visit: Payer: Medicare HMO | Admitting: Physician Assistant

## 2022-10-01 DIAGNOSIS — N289 Disorder of kidney and ureter, unspecified: Secondary | ICD-10-CM | POA: Diagnosis not present

## 2022-10-02 LAB — COMPREHENSIVE METABOLIC PANEL
ALT: 19 IU/L (ref 0–44)
AST: 19 IU/L (ref 0–40)
Albumin/Globulin Ratio: 1.7 (ref 1.2–2.2)
Albumin: 4.3 g/dL (ref 3.8–4.9)
Alkaline Phosphatase: 103 IU/L (ref 44–121)
BUN/Creatinine Ratio: 13 (ref 9–20)
BUN: 13 mg/dL (ref 6–24)
Bilirubin Total: 0.3 mg/dL (ref 0.0–1.2)
CO2: 24 mmol/L (ref 20–29)
Calcium: 9.1 mg/dL (ref 8.7–10.2)
Chloride: 101 mmol/L (ref 96–106)
Creatinine, Ser: 0.99 mg/dL (ref 0.76–1.27)
Globulin, Total: 2.6 g/dL (ref 1.5–4.5)
Glucose: 121 mg/dL — ABNORMAL HIGH (ref 70–99)
Potassium: 4.4 mmol/L (ref 3.5–5.2)
Sodium: 140 mmol/L (ref 134–144)
Total Protein: 6.9 g/dL (ref 6.0–8.5)
eGFR: 88 mL/min/{1.73_m2} (ref >59)

## 2022-10-05 ENCOUNTER — Other Ambulatory Visit: Payer: Self-pay | Admitting: Physician Assistant

## 2022-10-05 DIAGNOSIS — F411 Generalized anxiety disorder: Secondary | ICD-10-CM

## 2022-10-05 NOTE — Telephone Encounter (Signed)
Last1/24 next 10/09/2022

## 2022-10-09 ENCOUNTER — Encounter: Payer: Self-pay | Admitting: Physician Assistant

## 2022-10-09 ENCOUNTER — Ambulatory Visit (INDEPENDENT_AMBULATORY_CARE_PROVIDER_SITE_OTHER): Payer: Medicare HMO | Admitting: Physician Assistant

## 2022-10-09 VITALS — BP 137/91 | HR 84 | Temp 98.4°F | Resp 16 | Ht 72.0 in | Wt 276.4 lb

## 2022-10-09 DIAGNOSIS — F17210 Nicotine dependence, cigarettes, uncomplicated: Secondary | ICD-10-CM

## 2022-10-09 DIAGNOSIS — G8928 Other chronic postprocedural pain: Secondary | ICD-10-CM

## 2022-10-09 DIAGNOSIS — F411 Generalized anxiety disorder: Secondary | ICD-10-CM

## 2022-10-09 DIAGNOSIS — I10 Essential (primary) hypertension: Secondary | ICD-10-CM

## 2022-10-09 MED ORDER — NICOTINE 14 MG/24HR TD PT24: 14.0000 mg | MEDICATED_PATCH | Freq: Every day | TRANSDERMAL | 0 refills | Status: DC

## 2022-10-09 NOTE — Progress Notes (Signed)
[facility] [place], [place] [address]  Internal MEDICINE  Office Visit Note  Patient Name: Zachary Holmes  [id number]  [id number]  Date of Service: 10/09/2022  Chief Complaint  Patient presents with   Follow-up    Patient would like to try patches to stop smoking   Depression   Gastroesophageal Reflux   Hypertension   Quality Metric Gaps    TDAP    HPI  Pt is here for routine follow up -Taking Bp meds and cholesterol med daily -Using pill container now which is helping -1packs every 2.5days -Nicotine patches to pharmacy -Continues to take xanax, tramadolm and flexeril only as needed -Creatinine is back to normal now. 136/92 -Walking 6 milse per day  Current Medication: Outpatient Encounter Medications as of 10/09/2022  Medication Sig   ALPRAZolam (XANAX) 0.5 MG tablet TAKE 1 TABLET BY MOUTH DAILY AS NEEDED FOR ANXIETY   amLODipine (NORVASC) 5 MG tablet TAKE 1 TABLET(5 MG) BY MOUTH DAILY   buPROPion (WELLBUTRIN XL) 150 MG 24 hr tablet TAKE 1 TABLET BY MOUTH EVERY DAY IN THE MORNING FOR ANXIETY   cyclobenzaprine (FLEXERIL) 10 MG tablet Take 1 tablet (10 mg total) by mouth at bedtime as needed.   ezetimibe (ZETIA) 10 MG tablet Take 1 tablet (10 mg total) by mouth daily.   omeprazole (PRILOSEC) 40 MG capsule TAKE 1 CAPSULE(40 MG) BY MOUTH DAILY   traMADol (ULTRAM) 50 MG tablet Take 1 tablet (50 mg total) by mouth 2 (two) times daily as needed. for pain   No facility-administered encounter medications on file as of 10/09/2022.    Surgical History: Past Surgical History:  Procedure Laterality Date   ANKLE FUSION Right 11/24/2007   Archie Endo 12/04/2010   COLONOSCOPY WITH PROPOFOL N/A 05/13/2017   Procedure: COLONOSCOPY WITH PROPOFOL;  Surgeon: Lucilla Lame, MD;  Location: [place];  Service: Endoscopy;  Laterality: N/A;  requests early   COLONOSCOPY WITH PROPOFOL N/A 12/04/2021   Procedure: COLONOSCOPY WITH BIOPSIES;  Surgeon: Lucilla Lame,  MD;  Location: [place];  Service: Endoscopy;  Laterality: N/A;   FRACTURE SURGERY     JOINT REPLACEMENT     ankle-right, knee-left, both shoulders   KNEE ARTHROCENTESIS Left X 5   "got infected . . ."   MULTIPLE TOOTH EXTRACTIONS     POLYPECTOMY N/A 12/04/2021   Procedure: POLYPECTOMY;  Surgeon: Lucilla Lame, MD;  Location: [place];  Service: Endoscopy;  Laterality: N/A;   SHOULDER ARTHROSCOPY Left 06/2007   /notes 12/03/2010   TOTAL KNEE ARTHROPLASTY Left 2008   /notes 04/09/2010   TOTAL SHOULDER ARTHROPLASTY Left 09/20/2014   TOTAL SHOULDER ARTHROPLASTY Left 09/20/2014   Procedure: TOTAL SHOULDER ARTHROPLASTY;  Surgeon: Nita Sells, MD;  Location: [place];  Service: Orthopedics;  Laterality: Left;  Left total shoulder arthroplasty   TOTAL SHOULDER ARTHROPLASTY Right 06/02/2018   Procedure: RIGHT TOTAL SHOULDER ARTHROPLASTY;  Surgeon: Tania Ade, MD;  Location: [place];  Service: Orthopedics;  Laterality: Right;    Medical History: Past Medical History:  Diagnosis Date   Anxiety    Arthritis    "legs" (09/20/2014)   Depression    GERD (gastroesophageal reflux disease)    Hypertension    controlled on meds   OA (osteoarthritis) of shoulder    right   Seizures ([place]) 1983   x1, S/P head injury   Serratia marcescens infection 2011   left knee   Umbilical hernia    Wears dentures  partial upper    Family History: Family History  Problem Relation Age of Onset   COPD Mother    Heart disease Father     Social History   Socioeconomic History   Marital status: Single    Spouse name: Not on file   Number of children: Not on file   Years of education: Not on file   Highest education level: Not on file  Occupational History   Not on file  Tobacco Use   Smoking status: Every Day    Packs/day: 0.50    Years: 20.00    Total pack years: 10.00    Types: Cigarettes   Smokeless tobacco: Never   Tobacco comments:    1 pack every 2 days   Vaping Use   Vaping Use: Never used  Substance and Sexual Activity   Alcohol use: Not Currently    Alcohol/week: 12.0 standard drinks of alcohol    Types: 12 Cans of beer per week    Comment: SOBER SINCE 05/17/2017   Drug use: Yes    Types: Marijuana    Comment: DAILY   Sexual activity: Not Currently  Other Topics Concern   Not on file  Social History Narrative   Not on file   Social Determinants of Health   Financial Resource Strain: Low Risk  (04/04/2021)   Overall Financial Resource Strain (CARDIA)    Difficulty of Paying Living Expenses: Not very hard  Food Insecurity: Not on file  Transportation Needs: Not on file  Physical Activity: Not on file  Stress: Not on file  Social Connections: Not on file  Intimate Partner Violence: Not on file      Review of Systems  Vital Signs: BP (!) 137/91   Pulse 84   Temp 98.4 F (36.9 C)   Resp 16   Ht 6' (1.829 m)   Wt 276 lb 6.4 oz (125.4 kg)   SpO2 94%   BMI 37.49 kg/m    Physical Exam     Assessment/Plan:   General Counseling: Geovany verbalizes understanding of the findings of todays visit and agrees with plan of treatment. I have discussed any further diagnostic evaluation that may be needed or ordered today. We also reviewed his medications today. he has been encouraged to call the office with any questions or concerns that should arise related to todays visit.    No orders of the defined types were placed in this encounter.   No orders of the defined types were placed in this encounter.   This patient was seen by Drema Dallas, PA-C in collaboration with Dr. Clayborn Bigness as a part of collaborative care agreement.   Total time spent:*** Minutes Time spent includes review of chart, medications, test results, and follow up plan with the patient.      Dr Lavera Guise Internal medicine

## 2022-12-04 ENCOUNTER — Other Ambulatory Visit: Payer: Self-pay | Admitting: Physician Assistant

## 2022-12-04 DIAGNOSIS — F411 Generalized anxiety disorder: Secondary | ICD-10-CM

## 2022-12-04 NOTE — Telephone Encounter (Signed)
Last1/24 and 6/24

## 2022-12-16 ENCOUNTER — Other Ambulatory Visit: Payer: Self-pay | Admitting: Physician Assistant

## 2022-12-16 DIAGNOSIS — F411 Generalized anxiety disorder: Secondary | ICD-10-CM

## 2022-12-17 ENCOUNTER — Telehealth: Payer: Self-pay

## 2022-12-17 NOTE — Telephone Encounter (Signed)
Lmom to call us back regarding med refills  

## 2022-12-21 ENCOUNTER — Other Ambulatory Visit: Payer: Self-pay | Admitting: Physician Assistant

## 2022-12-21 DIAGNOSIS — M62838 Other muscle spasm: Secondary | ICD-10-CM

## 2023-01-08 ENCOUNTER — Ambulatory Visit (INDEPENDENT_AMBULATORY_CARE_PROVIDER_SITE_OTHER): Payer: Medicare HMO | Admitting: Physician Assistant

## 2023-01-08 ENCOUNTER — Encounter: Payer: Self-pay | Admitting: Physician Assistant

## 2023-01-08 VITALS — BP 128/82 | HR 87 | Temp 98.4°F | Resp 16 | Ht 72.0 in | Wt 270.0 lb

## 2023-01-08 DIAGNOSIS — G8928 Other chronic postprocedural pain: Secondary | ICD-10-CM | POA: Diagnosis not present

## 2023-01-08 DIAGNOSIS — F17210 Nicotine dependence, cigarettes, uncomplicated: Secondary | ICD-10-CM | POA: Diagnosis not present

## 2023-01-08 DIAGNOSIS — E782 Mixed hyperlipidemia: Secondary | ICD-10-CM

## 2023-01-08 DIAGNOSIS — M791 Myalgia, unspecified site: Secondary | ICD-10-CM | POA: Diagnosis not present

## 2023-01-08 DIAGNOSIS — F411 Generalized anxiety disorder: Secondary | ICD-10-CM

## 2023-01-08 DIAGNOSIS — T466X5A Adverse effect of antihyperlipidemic and antiarteriosclerotic drugs, initial encounter: Secondary | ICD-10-CM

## 2023-01-08 DIAGNOSIS — I10 Essential (primary) hypertension: Secondary | ICD-10-CM | POA: Diagnosis not present

## 2023-01-08 MED ORDER — TRAMADOL HCL 50 MG PO TABS
50.0000 mg | ORAL_TABLET | Freq: Two times a day (BID) | ORAL | 1 refills | Status: DC | PRN
Start: 2023-01-08 — End: 2023-05-11

## 2023-01-08 MED ORDER — BUPROPION HCL ER (XL) 150 MG PO TB24: ORAL_TABLET | ORAL | 1 refills | Status: DC
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
## 2023-02-04 ENCOUNTER — Other Ambulatory Visit: Payer: Self-pay | Admitting: Physician Assistant

## 2023-02-04 DIAGNOSIS — I10 Essential (primary) hypertension: Secondary | ICD-10-CM

## 2023-03-04 ENCOUNTER — Other Ambulatory Visit: Payer: Self-pay

## 2023-03-04 DIAGNOSIS — K219 Gastro-esophageal reflux disease without esophagitis: Secondary | ICD-10-CM

## 2023-03-04 DIAGNOSIS — F411 Generalized anxiety disorder: Secondary | ICD-10-CM

## 2023-03-04 MED ORDER — BUPROPION HCL ER (XL) 150 MG PO TB24
ORAL_TABLET | ORAL | 1 refills | Status: DC
Start: 2023-03-04 — End: 2023-08-13

## 2023-03-04 MED ORDER — OMEPRAZOLE 40 MG PO CPDR
DELAYED_RELEASE_CAPSULE | ORAL | 1 refills | Status: DC
Start: 2023-03-04 — End: 2023-08-13

## 2023-03-19 ENCOUNTER — Other Ambulatory Visit: Payer: Self-pay | Admitting: Physician Assistant

## 2023-03-19 DIAGNOSIS — M62838 Other muscle spasm: Secondary | ICD-10-CM

## 2023-03-25 ENCOUNTER — Telehealth: Payer: Self-pay

## 2023-03-26 ENCOUNTER — Other Ambulatory Visit: Payer: Self-pay | Admitting: Physician Assistant

## 2023-03-26 DIAGNOSIS — F411 Generalized anxiety disorder: Secondary | ICD-10-CM

## 2023-03-26 MED ORDER — ALPRAZOLAM 0.5 MG PO TABS: ORAL_TABLET | ORAL | 2 refills | Status: DC

## 2023-03-26 NOTE — Telephone Encounter (Signed)
Pt advised we sent med  

## 2023-04-15 ENCOUNTER — Encounter: Payer: Self-pay | Admitting: Physician Assistant

## 2023-04-15 ENCOUNTER — Ambulatory Visit (INDEPENDENT_AMBULATORY_CARE_PROVIDER_SITE_OTHER): Payer: Medicare HMO | Admitting: Physician Assistant

## 2023-04-15 VITALS — BP 125/80 | HR 77 | Temp 98.3°F | Resp 16 | Ht 72.0 in | Wt 269.0 lb

## 2023-04-15 DIAGNOSIS — F17210 Nicotine dependence, cigarettes, uncomplicated: Secondary | ICD-10-CM | POA: Diagnosis not present

## 2023-04-15 DIAGNOSIS — E782 Mixed hyperlipidemia: Secondary | ICD-10-CM | POA: Diagnosis not present

## 2023-04-15 DIAGNOSIS — R7303 Prediabetes: Secondary | ICD-10-CM

## 2023-04-15 DIAGNOSIS — T466X5A Adverse effect of antihyperlipidemic and antiarteriosclerotic drugs, initial encounter: Secondary | ICD-10-CM

## 2023-04-15 DIAGNOSIS — F411 Generalized anxiety disorder: Secondary | ICD-10-CM | POA: Diagnosis not present

## 2023-04-15 DIAGNOSIS — Z125 Encounter for screening for malignant neoplasm of prostate: Secondary | ICD-10-CM

## 2023-04-15 DIAGNOSIS — M791 Myalgia, unspecified site: Secondary | ICD-10-CM

## 2023-04-15 DIAGNOSIS — I10 Essential (primary) hypertension: Secondary | ICD-10-CM

## 2023-04-15 DIAGNOSIS — R946 Abnormal results of thyroid function studies: Secondary | ICD-10-CM

## 2023-04-15 DIAGNOSIS — R5383 Other fatigue: Secondary | ICD-10-CM

## 2023-04-15 NOTE — Progress Notes (Signed)
[facility] [address] [place], [place] [address]  Internal MEDICINE  Office Visit Note  Patient Name: Zachary Holmes  [id number]  [id number]  Date of Service: 04/15/2023  Chief Complaint  Patient presents with   Follow-up   Depression   Gastroesophageal Reflux   Hypertension    HPI Pt is here for routine follow up -BP stable -Still smoking about 1/2 ppd, working to continue to cut back. States it is boring now and is hopeful to quit like he did with alcohol 7 years ago.  -Will order labs now for CPE -taking meds as prescribed -has a little congestion a few days ago with sneezing, but reports this seems to have cleared up now  Current Medication: Outpatient Encounter Medications as of 04/15/2023  Medication Sig   ALPRAZolam (XANAX) 0.5 MG tablet Take 1 tablet by mouth daily as needed for anxiety   amLODipine (NORVASC) 5 MG tablet TAKE 1 TABLET(5 MG) BY MOUTH DAILY   buPROPion (WELLBUTRIN XL) 150 MG 24 hr tablet TAKE 1 TABLET BY MOUTH EVERY DAY IN THE MORNING FOR ANXIETY   cyclobenzaprine (FLEXERIL) 10 MG tablet TAKE 1 TABLET(10 MG) BY MOUTH AT BEDTIME AS NEEDED   ezetimibe (ZETIA) 10 MG tablet Take 1 tablet (10 mg total) by mouth daily.   nicotine (NICODERM CQ - DOSED IN MG/24 HOURS) 14 mg/24hr patch Place 1 patch (14 mg total) onto the skin daily. For 3-4 weeks then decrease to 7mg  patches.   omeprazole (PRILOSEC) 40 MG capsule TAKE 1 CAPSULE(40 MG) BY MOUTH DAILY   Tdap (ADACEL) 12-02-13.5 LF-MCG/0.5 injection Inject 0.5 mLs into the muscle once.   traMADol (ULTRAM) 50 MG tablet Take 1 tablet (50 mg total) by mouth 2 (two) times daily as needed. for pain   No facility-administered encounter medications on file as of 04/15/2023.    Surgical History: Past Surgical History:  Procedure Laterality Date   ANKLE FUSION Right 11/24/2007   Hattie Perch 12/04/2010   COLONOSCOPY WITH PROPOFOL N/A 05/13/2017   Procedure: COLONOSCOPY WITH PROPOFOL;  Surgeon: Midge Minium, MD;   Location: [facility];  Service: Endoscopy;  Laterality: N/A;  requests early   COLONOSCOPY WITH PROPOFOL N/A 12/04/2021   Procedure: COLONOSCOPY WITH BIOPSIES;  Surgeon: Midge Minium, MD;  Location: [facility];  Service: Endoscopy;  Laterality: N/A;   FRACTURE SURGERY     JOINT REPLACEMENT     ankle-right, knee-left, both shoulders   KNEE ARTHROCENTESIS Left X 5   "got infected . . ."   MULTIPLE TOOTH EXTRACTIONS     POLYPECTOMY N/A 12/04/2021   Procedure: POLYPECTOMY;  Surgeon: Midge Minium, MD;  Location: [facility];  Service: Endoscopy;  Laterality: N/A;   SHOULDER ARTHROSCOPY Left 06/2007   /notes 12/03/2010   TOTAL KNEE ARTHROPLASTY Left 2008   /notes 04/09/2010   TOTAL SHOULDER ARTHROPLASTY Left 09/20/2014   TOTAL SHOULDER ARTHROPLASTY Left 09/20/2014   Procedure: TOTAL SHOULDER ARTHROPLASTY;  Surgeon: Mable Paris, MD;  Location: [facility] OR;  Service: Orthopedics;  Laterality: Left;  Left total shoulder arthroplasty   TOTAL SHOULDER ARTHROPLASTY Right 06/02/2018   Procedure: RIGHT TOTAL SHOULDER ARTHROPLASTY;  Surgeon: Jones Broom, MD;  Location: [facility] OR;  Service: Orthopedics;  Laterality: Right;    Medical History: Past Medical History:  Diagnosis Date   Anxiety    Arthritis    "legs" (09/20/2014)   Depression    GERD (gastroesophageal reflux disease)    Hypertension    controlled on meds   OA (  osteoarthritis) of shoulder    right   Seizures (HCC) 1983   x1, S/P head injury   Serratia marcescens infection 2011   left knee   Umbilical hernia    Wears dentures    partial upper    Family History: Family History  Problem Relation Age of Onset   COPD Mother    Heart disease Father     Social History   Socioeconomic History   Marital status: Single    Spouse name: Not on file   Number of children: Not on file   Years of education: Not on file   Highest education level: Not on file  Occupational History   Not on file  Tobacco  Use   Smoking status: Every Day    Current packs/day: 0.50    Average packs/day: 0.5 packs/day for 20.0 years (10.0 ttl pk-yrs)    Types: Cigarettes   Smokeless tobacco: Never   Tobacco comments:    1 pack every 2 days  Vaping Use   Vaping status: Never Used  Substance and Sexual Activity   Alcohol use: Not Currently    Alcohol/week: 12.0 standard drinks of alcohol    Types: 12 Cans of beer per week    Comment: SOBER SINCE 05/17/2017   Drug use: Yes    Types: Marijuana    Comment: DAILY   Sexual activity: Not Currently  Other Topics Concern   Not on file  Social History Narrative   Not on file   Social Determinants of Health   Financial Resource Strain: Low Risk  (04/04/2021)   Overall Financial Resource Strain (CARDIA)    Difficulty of Paying Living Expenses: Not very hard  Food Insecurity: Not on file  Transportation Needs: Not on file  Physical Activity: Not on file  Stress: Not on file  Social Connections: Not on file  Intimate Partner Violence: Not on file      Review of Systems  Constitutional:  Negative for chills, fatigue and unexpected weight change.  HENT:  Negative for congestion, postnasal drip, rhinorrhea, sneezing and sore throat.   Eyes:  Negative for redness.  Respiratory:  Negative for cough, chest tightness and shortness of breath.   Cardiovascular:  Negative for chest pain and palpitations.  Gastrointestinal:  Negative for abdominal pain, constipation, diarrhea, nausea and vomiting.  Genitourinary:  Negative for dysuria and frequency.  Musculoskeletal:  Positive for arthralgias and back pain. Negative for joint swelling, myalgias and neck pain.  Skin:  Negative for rash.  Neurological: Negative.  Negative for tremors and numbness.  Hematological:  Negative for adenopathy. Does not bruise/bleed easily.  Psychiatric/Behavioral:  Negative for behavioral problems (Depression), sleep disturbance and suicidal ideas. The patient is nervous/anxious.      Vital Signs: BP 125/80   Pulse 77   Temp 98.3 F (36.8 C)   Resp 16   Ht 6' (1.829 m)   Wt 269 lb (122 kg)   SpO2 95%   BMI 36.48 kg/m    Physical Exam Vitals and nursing note reviewed.  Constitutional:      General: He is not in acute distress.    Appearance: He is well-developed. He is obese. He is not diaphoretic.  HENT:     Head: Normocephalic and atraumatic.     Mouth/Throat:     Pharynx: No oropharyngeal exudate.  Eyes:     Pupils: Pupils are equal, round, and reactive to light.  Neck:     Thyroid: No thyromegaly.  Vascular: No JVD.     Trachea: No tracheal deviation.  Cardiovascular:     Rate and Rhythm: Normal rate and regular rhythm.     Heart sounds: Normal heart sounds. No murmur heard.    No friction rub. No gallop.  Pulmonary:     Effort: Pulmonary effort is normal. No respiratory distress.     Breath sounds: No wheezing or rales.  Chest:     Chest wall: No tenderness.  Abdominal:     General: Bowel sounds are normal.     Palpations: Abdomen is soft.  Musculoskeletal:        General: Normal range of motion.     Cervical back: Normal range of motion and neck supple.     Right lower leg: No edema.     Left lower leg: No edema.     Comments: Chronic pain throughout legs, especially knees and back with movement.   Lymphadenopathy:     Cervical: No cervical adenopathy.  Skin:    General: Skin is warm and dry.  Neurological:     Mental Status: He is alert and oriented to person, place, and time.     Cranial Nerves: No cranial nerve deficit.  Psychiatric:        Behavior: Behavior normal.        Thought Content: Thought content normal.        Judgment: Judgment normal.        Assessment/Plan: 1. Essential hypertension, benign Stable, continue current medication  2. GAD (generalized anxiety disorder) May continue current medication  3. Cigarette nicotine dependence without complication Continue Wellbutrin and work on smoking  cessation  4. Mixed hyperlipidemia Continue Zetia and will recheck labs - Lipid Panel With LDL/HDL Ratio  5. Myalgia due to statin Cannot tolerate statin, continue zetia  6. Prediabetes - Hgb A1C w/o eAG  7. Screening for prostate cancer - PSA Total (Reflex To Free)  8. Abnormal thyroid exam - TSH + free T4  9. Other fatigue - CBC w/Diff/Platelet - Comprehensive metabolic panel - TSH + free T4 - Lipid Panel With LDL/HDL Ratio - PSA Total (Reflex To Free)   General Counseling: javeion chima understanding of the findings of todays visit and agrees with plan of treatment. I have discussed any further diagnostic evaluation that may be needed or ordered today. We also reviewed his medications today. he has been encouraged to call the office with any questions or concerns that should arise related to todays visit.    Orders Placed This Encounter  Procedures   CBC w/Diff/Platelet   Comprehensive metabolic panel   TSH + free T4   Lipid Panel With LDL/HDL Ratio   PSA Total (Reflex To Free)   Hgb A1C w/o eAG    No orders of the defined types were placed in this encounter.   This patient was seen by Lynn Ito, PA-C in collaboration with Dr. Beverely Risen as a part of collaborative care agreement.   Total time spent:30 Minutes Time spent includes review of chart, medications, test results, and follow up plan with the patient.      Dr Lyndon Code Internal medicine

## 2023-04-28 ENCOUNTER — Other Ambulatory Visit: Payer: Self-pay | Admitting: Physician Assistant

## 2023-04-28 DIAGNOSIS — M62838 Other muscle spasm: Secondary | ICD-10-CM

## 2023-04-28 DIAGNOSIS — E782 Mixed hyperlipidemia: Secondary | ICD-10-CM

## 2023-04-28 MED ORDER — CYCLOBENZAPRINE HCL 10 MG PO TABS: ORAL_TABLET | ORAL | 2 refills | Status: DC

## 2023-05-09 ENCOUNTER — Other Ambulatory Visit: Payer: Self-pay | Admitting: Physician Assistant

## 2023-05-09 DIAGNOSIS — G8928 Other chronic postprocedural pain: Secondary | ICD-10-CM

## 2023-07-15 LAB — CBC WITH DIFFERENTIAL/PLATELET
Basophils Absolute: 0.1 10*3/uL (ref 0.0–0.2)
Basos: 1 %
EOS (ABSOLUTE): 0.4 10*3/uL (ref 0.0–0.4)
Eos: 5 %
Hematocrit: 50.3 % (ref 37.5–51.0)
Hemoglobin: 16.3 g/dL (ref 13.0–17.7)
Immature Grans (Abs): 0 10*3/uL (ref 0.0–0.1)
Immature Granulocytes: 0 %
Lymphocytes Absolute: 1.9 10*3/uL (ref 0.7–3.1)
Lymphs: 24 %
MCH: 29.3 pg (ref 26.6–33.0)
MCHC: 32.4 g/dL (ref 31.5–35.7)
MCV: 90 fL (ref 79–97)
Monocytes Absolute: 0.4 10*3/uL (ref 0.1–0.9)
Monocytes: 5 %
Neutrophils Absolute: 5.2 10*3/uL (ref 1.4–7.0)
Neutrophils: 65 %
Platelets: 277 10*3/uL (ref 150–450)
RBC: 5.57 x10E6/uL (ref 4.14–5.80)
RDW: 13.1 % (ref 11.6–15.4)
WBC: 7.9 10*3/uL (ref 3.4–10.8)

## 2023-07-15 LAB — COMPREHENSIVE METABOLIC PANEL
ALT: 18 [IU]/L (ref 0–44)
AST: 22 [IU]/L (ref 0–40)
Albumin: 4.2 g/dL (ref 3.8–4.9)
Alkaline Phosphatase: 97 [IU]/L (ref 44–121)
BUN/Creatinine Ratio: 16 (ref 10–24)
BUN: 18 mg/dL (ref 8–27)
Bilirubin Total: 0.3 mg/dL (ref 0.0–1.2)
CO2: 23 mmol/L (ref 20–29)
Calcium: 9.5 mg/dL (ref 8.6–10.2)
Chloride: 103 mmol/L (ref 96–106)
Creatinine, Ser: 1.1 mg/dL (ref 0.76–1.27)
Globulin, Total: 2.7 g/dL (ref 1.5–4.5)
Glucose: 123 mg/dL — ABNORMAL HIGH (ref 70–99)
Potassium: 4.5 mmol/L (ref 3.5–5.2)
Sodium: 140 mmol/L (ref 134–144)
Total Protein: 6.9 g/dL (ref 6.0–8.5)
eGFR: 77 mL/min/{1.73_m2} (ref >59)

## 2023-07-15 LAB — HGB A1C W/O EAG: Hgb A1c MFr Bld: 6.2 % — ABNORMAL HIGH (ref 4.8–5.6)

## 2023-07-15 LAB — LIPID PANEL WITH LDL/HDL RATIO
Cholesterol, Total: 194 mg/dL (ref 100–199)
HDL: 37 mg/dL — ABNORMAL LOW (ref >39)
LDL Chol Calc (NIH): 124 mg/dL — ABNORMAL HIGH (ref 0–99)
LDL/HDL Ratio: 3.4 {ratio} (ref 0.0–3.6)
Triglycerides: 184 mg/dL — ABNORMAL HIGH (ref 0–149)
VLDL Cholesterol Cal: 33 mg/dL (ref 5–40)

## 2023-07-15 LAB — TSH+FREE T4
Free T4: 0.92 ng/dL (ref 0.82–1.77)
TSH: 2.31 u[IU]/mL (ref 0.450–4.500)

## 2023-07-15 LAB — PSA TOTAL (REFLEX TO FREE): Prostate Specific Ag, Serum: 0.4 ng/mL (ref 0.0–4.0)

## 2023-08-13 ENCOUNTER — Encounter: Payer: Self-pay | Admitting: Physician Assistant

## 2023-08-13 ENCOUNTER — Ambulatory Visit (INDEPENDENT_AMBULATORY_CARE_PROVIDER_SITE_OTHER): Payer: No Typology Code available for payment source | Admitting: Physician Assistant

## 2023-08-13 ENCOUNTER — Telehealth: Payer: Self-pay | Admitting: Physician Assistant

## 2023-08-13 VITALS — BP 125/85 | HR 87 | Temp 97.9°F | Resp 16 | Ht 72.0 in | Wt 280.8 lb

## 2023-08-13 DIAGNOSIS — M791 Myalgia, unspecified site: Secondary | ICD-10-CM | POA: Diagnosis not present

## 2023-08-13 DIAGNOSIS — R7303 Prediabetes: Secondary | ICD-10-CM | POA: Diagnosis not present

## 2023-08-13 DIAGNOSIS — E782 Mixed hyperlipidemia: Secondary | ICD-10-CM | POA: Diagnosis not present

## 2023-08-13 DIAGNOSIS — Z0001 Encounter for general adult medical examination with abnormal findings: Secondary | ICD-10-CM

## 2023-08-13 DIAGNOSIS — H9192 Unspecified hearing loss, left ear: Secondary | ICD-10-CM

## 2023-08-13 DIAGNOSIS — G8928 Other chronic postprocedural pain: Secondary | ICD-10-CM | POA: Diagnosis not present

## 2023-08-13 DIAGNOSIS — T466X5A Adverse effect of antihyperlipidemic and antiarteriosclerotic drugs, initial encounter: Secondary | ICD-10-CM

## 2023-08-13 DIAGNOSIS — I10 Essential (primary) hypertension: Secondary | ICD-10-CM | POA: Diagnosis not present

## 2023-08-13 DIAGNOSIS — F411 Generalized anxiety disorder: Secondary | ICD-10-CM

## 2023-08-13 DIAGNOSIS — K219 Gastro-esophageal reflux disease without esophagitis: Secondary | ICD-10-CM

## 2023-08-13 MED ORDER — ALPRAZOLAM 0.5 MG PO TABS
ORAL_TABLET | ORAL | 2 refills | Status: DC
Start: 2023-08-13 — End: 2024-03-23

## 2023-08-13 MED ORDER — BUPROPION HCL ER (XL) 150 MG PO TB24
ORAL_TABLET | ORAL | 1 refills | Status: DC
Start: 2023-08-13 — End: 2023-09-15

## 2023-08-13 MED ORDER — TRAMADOL HCL 50 MG PO TABS
50.0000 mg | ORAL_TABLET | Freq: Two times a day (BID) | ORAL | 0 refills | Status: DC | PRN
Start: 2023-08-13 — End: 2024-02-10

## 2023-08-13 MED ORDER — OMEPRAZOLE 40 MG PO CPDR
DELAYED_RELEASE_CAPSULE | ORAL | 1 refills | Status: DC
Start: 2023-08-13 — End: 2024-03-06

## 2023-08-13 NOTE — Addendum Note (Signed)
 Addended by: Lynn Ito on: 08/13/2023 09:36 AM   Modules accepted: Orders

## 2023-08-13 NOTE — Progress Notes (Addendum)
 [facility] [address] [place], [place] [address]  Internal MEDICINE  Office Visit Note  Patient Name: Zachary Holmes  [id number]  [id number]  Date of Service: 08/13/2023  Chief Complaint  Patient presents with   Medicare Wellness   Depression   Gastroesophageal Reflux   Hypertension     HPI Pt is here for routine health maintenance examination -labs reviewed: A1c higher at 6.2 now, cholesterol elevated again, has not been eating as well and will work on this -ortho put him on mobic , otherwise no changes -Left ear having decreased hearing/muffled unless he shifts lower jaw forward. No pain in ear or jaw. Does know he clenches jaw at night but can't wear guard due to taking bridge out at night. Had a wisdom tooth pulled 6 months ago but ear problem started prior. Also has some wax buildup and flaking obstructing view. Will refer to ENT -otherwise doing well and no other concerns -BP stable -declines PNA vaccines -UTD on colon screening     08/13/2023    9:00 AM 08/07/2022    9:05 AM 07/25/2021   11:29 AM  MMSE - Mini Mental State Exam  Orientation to time 5 5 5   Orientation to Place 5 5 5   Registration 3 3 3   Attention/ Calculation 5 5 5   Recall 3 3 3   Language- name 2 objects 2 2 2   Language- repeat 1 1 1   Language- follow 3 step command 3 3 3   Language- read & follow direction 1 1 1   Write a sentence 1 1 1   Copy design 1 1 1   Total score 30 30 30     Current Medication: Outpatient Encounter Medications as of 08/13/2023  Medication Sig   amLODipine  (NORVASC ) 5 MG tablet TAKE 1 TABLET(5 MG) BY MOUTH DAILY   cyclobenzaprine  (FLEXERIL ) 10 MG tablet Take 1 tab po bedtime as needed   ezetimibe  (ZETIA ) 10 MG tablet TAKE 1 TABLET(10 MG) BY MOUTH DAILY   meloxicam  (MOBIC ) 15 MG tablet Take 15 mg by mouth daily.   nicotine  (NICODERM CQ  - DOSED IN MG/24 HOURS) 14 mg/24hr patch Place 1 patch (14 mg total) onto the skin daily. For 3-4 weeks then decrease to  7mg  patches.   Tdap (ADACEL) 12-02-13.5 LF-MCG/0.5 injection Inject 0.5 mLs into the muscle once.   [DISCONTINUED] ALPRAZolam  (XANAX ) 0.5 MG tablet Take 1 tablet by mouth daily as needed for anxiety   [DISCONTINUED] buPROPion  (WELLBUTRIN  XL) 150 MG 24 hr tablet TAKE 1 TABLET BY MOUTH EVERY DAY IN THE MORNING FOR ANXIETY   [DISCONTINUED] omeprazole  (PRILOSEC) 40 MG capsule TAKE 1 CAPSULE(40 MG) BY MOUTH DAILY   [DISCONTINUED] traMADol  (ULTRAM ) 50 MG tablet Take 1 tablet (50 mg total) by mouth 2 (two) times daily as needed. for pain   ALPRAZolam  (XANAX ) 0.5 MG tablet Take 1 tablet by mouth daily as needed for anxiety   buPROPion  (WELLBUTRIN  XL) 150 MG 24 hr tablet TAKE 1 TABLET BY MOUTH EVERY DAY IN THE MORNING FOR ANXIETY   omeprazole  (PRILOSEC) 40 MG capsule TAKE 1 CAPSULE(40 MG) BY MOUTH DAILY   traMADol  (ULTRAM ) 50 MG tablet Take 1 tablet (50 mg total) by mouth 2 (two) times daily as needed. for pain   No facility-administered encounter medications on file as of 08/13/2023.    Surgical History: Past Surgical History:  Procedure Laterality Date   ANKLE FUSION Right 11/24/2007   thelbert 12/04/2010   COLONOSCOPY WITH PROPOFOL  N/A 05/13/2017   Procedure: COLONOSCOPY WITH PROPOFOL ;  Surgeon:  Jinny Carmine, MD;  Location: [facility];  Service: Endoscopy;  Laterality: N/A;  requests early   COLONOSCOPY WITH PROPOFOL  N/A 12/04/2021   Procedure: COLONOSCOPY WITH BIOPSIES;  Surgeon: Jinny Carmine, MD;  Location: [facility];  Service: Endoscopy;  Laterality: N/A;   FRACTURE SURGERY     JOINT REPLACEMENT     ankle-right, knee-left, both shoulders   KNEE ARTHROCENTESIS Left X 5   got infected . . .   MULTIPLE TOOTH EXTRACTIONS     POLYPECTOMY N/A 12/04/2021   Procedure: POLYPECTOMY;  Surgeon: Jinny Carmine, MD;  Location: [facility];  Service: Endoscopy;  Laterality: N/A;   SHOULDER ARTHROSCOPY Left 06/2007   /notes 12/03/2010   TOTAL KNEE ARTHROPLASTY Left 2008   /notes 04/09/2010    TOTAL SHOULDER ARTHROPLASTY Left 09/20/2014   TOTAL SHOULDER ARTHROPLASTY Left 09/20/2014   Procedure: TOTAL SHOULDER ARTHROPLASTY;  Surgeon: Eva Elsie Herring, MD;  Location: [facility] OR;  Service: Orthopedics;  Laterality: Left;  Left total shoulder arthroplasty   TOTAL SHOULDER ARTHROPLASTY Right 06/02/2018   Procedure: RIGHT TOTAL SHOULDER ARTHROPLASTY;  Surgeon: Herring Eva, MD;  Location: [facility] OR;  Service: Orthopedics;  Laterality: Right;    Medical History: Past Medical History:  Diagnosis Date   Anxiety    Arthritis    legs (09/20/2014)   Depression    GERD (gastroesophageal reflux disease)    Hypertension    controlled on meds   OA (osteoarthritis) of shoulder    right   Seizures (HCC) 1983   x1, S/P head injury   Serratia marcescens infection 2011   left knee   Umbilical hernia    Wears dentures    partial upper    Family History: Family History  Problem Relation Age of Onset   COPD Mother    Heart disease Father       Review of Systems  Constitutional:  Negative for chills, fatigue and unexpected weight change.  HENT:  Positive for hearing loss. Negative for congestion, postnasal drip, rhinorrhea, sneezing and sore throat.   Eyes:  Negative for redness.  Respiratory:  Negative for cough, chest tightness and shortness of breath.   Cardiovascular:  Negative for chest pain and palpitations.  Gastrointestinal:  Negative for abdominal pain, constipation, diarrhea, nausea and vomiting.  Genitourinary:  Negative for dysuria and frequency.  Musculoskeletal:  Positive for arthralgias and back pain. Negative for joint swelling, myalgias and neck pain.  Skin:  Negative for rash.  Neurological: Negative.  Negative for tremors and numbness.  Hematological:  Negative for adenopathy. Does not bruise/bleed easily.  Psychiatric/Behavioral:  Negative for behavioral problems (Depression), sleep disturbance and suicidal ideas. The patient is nervous/anxious.       Vital Signs: BP 125/85   Pulse 87   Temp 97.9 F (36.6 C)   Resp 16   Ht 6' (1.829 m)   Wt 280 lb 12.8 oz (127.4 kg)   SpO2 94%   BMI 38.08 kg/m    Physical Exam Vitals and nursing note reviewed.  Constitutional:      General: He is not in acute distress.    Appearance: He is well-developed. He is obese. He is not diaphoretic.  HENT:     Head: Normocephalic and atraumatic.     Right Ear: There is impacted cerumen.     Left Ear: There is impacted cerumen.     Mouth/Throat:     Pharynx: No oropharyngeal exudate.  Eyes:     Pupils: Pupils are equal, round, and  reactive to light.  Neck:     Thyroid : No thyromegaly.     Vascular: No JVD.     Trachea: No tracheal deviation.  Cardiovascular:     Rate and Rhythm: Normal rate and regular rhythm.     Heart sounds: Normal heart sounds. No murmur heard.    No friction rub. No gallop.  Pulmonary:     Effort: Pulmonary effort is normal. No respiratory distress.     Breath sounds: No wheezing or rales.  Chest:     Chest wall: No tenderness.  Abdominal:     General: Bowel sounds are normal.     Palpations: Abdomen is soft.     Tenderness: There is no abdominal tenderness.  Musculoskeletal:        General: Normal range of motion.     Cervical back: Normal range of motion and neck supple.     Right lower leg: No edema.     Left lower leg: No edema.     Comments: Chronic pain throughout legs, especially knees and back with movement.   Lymphadenopathy:     Cervical: No cervical adenopathy.  Skin:    General: Skin is warm and dry.  Neurological:     Mental Status: He is alert and oriented to person, place, and time.     Cranial Nerves: No cranial nerve deficit.  Psychiatric:        Behavior: Behavior normal.        Thought Content: Thought content normal.        Judgment: Judgment normal.      LABS: Recent Results (from the past 2160 hours)  CBC w/Diff/Platelet     Status: None   Collection Time: 07/14/23  9:43  AM  Result Value Ref Range   WBC 7.9 3.4 - 10.8 x10E3/uL   RBC 5.57 4.14 - 5.80 x10E6/uL   Hemoglobin 16.3 13.0 - 17.7 g/dL   Hematocrit 49.6 62.4 - 51.0 %   MCV 90 79 - 97 fL   MCH 29.3 26.6 - 33.0 pg   MCHC 32.4 31.5 - 35.7 g/dL   RDW 86.8 88.3 - 84.5 %   Platelets 277 150 - 450 x10E3/uL   Neutrophils 65 Not Estab. %   Lymphs 24 Not Estab. %   Monocytes 5 Not Estab. %   Eos 5 Not Estab. %   Basos 1 Not Estab. %   Neutrophils Absolute 5.2 1.4 - 7.0 x10E3/uL   Lymphocytes Absolute 1.9 0.7 - 3.1 x10E3/uL   Monocytes Absolute 0.4 0.1 - 0.9 x10E3/uL   EOS (ABSOLUTE) 0.4 0.0 - 0.4 x10E3/uL   Basophils Absolute 0.1 0.0 - 0.2 x10E3/uL   Immature Granulocytes 0 Not Estab. %   Immature Grans (Abs) 0.0 0.0 - 0.1 x10E3/uL  Comprehensive metabolic panel     Status: Abnormal   Collection Time: 07/14/23  9:43 AM  Result Value Ref Range   Glucose 123 (H) 70 - 99 mg/dL   BUN 18 8 - 27 mg/dL   Creatinine, Ser 8.89 0.76 - 1.27 mg/dL   eGFR 77 >40 fO/fpw/8.26   BUN/Creatinine Ratio 16 10 - 24   Sodium 140 134 - 144 mmol/L   Potassium 4.5 3.5 - 5.2 mmol/L   Chloride 103 96 - 106 mmol/L   CO2 23 20 - 29 mmol/L   Calcium  9.5 8.6 - 10.2 mg/dL   Total Protein 6.9 6.0 - 8.5 g/dL   Albumin 4.2 3.8 - 4.9 g/dL   Globulin, Total 2.7 1.5 -  4.5 g/dL   Bilirubin Total 0.3 0.0 - 1.2 mg/dL   Alkaline Phosphatase 97 44 - 121 IU/L   AST 22 0 - 40 IU/L   ALT 18 0 - 44 IU/L  TSH + free T4     Status: None   Collection Time: 07/14/23  9:43 AM  Result Value Ref Range   TSH 2.310 0.450 - 4.500 uIU/mL   Free T4 0.92 0.82 - 1.77 ng/dL  Lipid Panel With LDL/HDL Ratio     Status: Abnormal   Collection Time: 07/14/23  9:43 AM  Result Value Ref Range   Cholesterol, Total 194 100 - 199 mg/dL   Triglycerides 815 (H) 0 - 149 mg/dL   HDL 37 (L) >60 mg/dL   VLDL Cholesterol Cal 33 5 - 40 mg/dL   LDL Chol Calc (NIH) 875 (H) 0 - 99 mg/dL   LDL/HDL Ratio 3.4 0.0 - 3.6 ratio    Comment:                                      LDL/HDL Ratio                                             Men  Women                               1/2 Avg.Risk  1.0    1.5                                   Avg.Risk  3.6    3.2                                2X Avg.Risk  6.2    5.0                                3X Avg.Risk  8.0    6.1   PSA Total (Reflex To Free)     Status: None   Collection Time: 07/14/23  9:43 AM  Result Value Ref Range   Prostate Specific Ag, Serum 0.4 0.0 - 4.0 ng/mL    Comment: Roche ECLIA methodology. According to the American Urological Association, Serum PSA should decrease and remain at undetectable levels after radical prostatectomy. The AUA defines biochemical recurrence as an initial PSA value 0.2 ng/mL or greater followed by a subsequent confirmatory PSA value 0.2 ng/mL or greater. Values obtained with different assay methods or kits cannot be used interchangeably. Results cannot be interpreted as absolute evidence of the presence or absence of malignant disease.    Reflex Criteria Comment     Comment: The percent free PSA is performed on a reflex basis only when the total PSA is between 4.0 and 10.0 ng/mL.   Hgb A1C w/o eAG     Status: Abnormal   Collection Time: 07/14/23  9:43 AM  Result Value Ref Range   Hgb A1c MFr Bld 6.2 (H) 4.8 - 5.6 %    Comment:  Prediabetes: 5.7 - 6.4          Diabetes: >6.4          Glycemic control for adults with diabetes: <7.0        Assessment/Plan: 1. Encounter for general adult medical examination with abnormal findings (Primary) Cpe performed, declines PNA vaccines, otherwise UTD on PHM  2. Essential hypertension, benign Stable, continue current medications  3. Prediabetes Increased from previous check and will work on diet and exercise  4. Mixed hyperlipidemia Worse from last year and will work on diet, cannot tolerate statin, continue zetia   5. GAD (generalized anxiety disorder) May continue wellbutrin , xanax  prn - buPROPion   (WELLBUTRIN  XL) 150 MG 24 hr tablet; TAKE 1 TABLET BY MOUTH EVERY DAY IN THE MORNING FOR ANXIETY  Dispense: 90 tablet; Refill: 1 - ALPRAZolam  (XANAX ) 0.5 MG tablet; Take 1 tablet by mouth daily as needed for anxiety  Dispense: 30 tablet; Refill: 2  6. Gastroesophageal reflux disease without esophagitis - omeprazole  (PRILOSEC) 40 MG capsule; TAKE 1 CAPSULE(40 MG) BY MOUTH DAILY  Dispense: 90 capsule; Refill: 1  7. Other chronic postprocedural pain May continue tramadol  prn - traMADol  (ULTRAM ) 50 MG tablet; Take 1 tablet (50 mg total) by mouth 2 (two) times daily as needed. for pain  Dispense: 60 tablet; Refill: 0  8. Change in hearing, left Will refer to ENT due to chronic change in hearing on left side - Ambulatory referral to ENT  9. Myalgia due to statin Intolerant to statin, continue zetia    General Counseling: karry causer understanding of the findings of todays visit and agrees with plan of treatment. I have discussed any further diagnostic evaluation that may be needed or ordered today. We also reviewed his medications today. he has been encouraged to call the office with any questions or concerns that should arise related to todays visit.    Counseling:    Orders Placed This Encounter  Procedures   Ambulatory referral to ENT    Meds ordered this encounter  Medications   buPROPion  (WELLBUTRIN  XL) 150 MG 24 hr tablet    Sig: TAKE 1 TABLET BY MOUTH EVERY DAY IN THE MORNING FOR ANXIETY    Dispense:  90 tablet    Refill:  1   omeprazole  (PRILOSEC) 40 MG capsule    Sig: TAKE 1 CAPSULE(40 MG) BY MOUTH DAILY    Dispense:  90 capsule    Refill:  1   traMADol  (ULTRAM ) 50 MG tablet    Sig: Take 1 tablet (50 mg total) by mouth 2 (two) times daily as needed. for pain    Dispense:  60 tablet    Refill:  0   ALPRAZolam  (XANAX ) 0.5 MG tablet    Sig: Take 1 tablet by mouth daily as needed for anxiety    Dispense:  30 tablet    Refill:  2    This patient was seen by  Tinnie Pro, PA-C in collaboration with Dr. Sigrid Bathe as a part of collaborative care agreement.  Total time spent:35 Minutes  Time spent includes review of chart, medications, test results, and follow up plan with the patient.     Sigrid CHRISTELLA Bathe, MD  Internal Medicine

## 2023-08-13 NOTE — Telephone Encounter (Addendum)
 Otolaryngology referral faxed to [facility]; [phone]. emailed patient per his request. Gave pt telephone [phone]

## 2023-08-16 ENCOUNTER — Other Ambulatory Visit: Payer: Self-pay | Admitting: Physician Assistant

## 2023-08-16 DIAGNOSIS — M62838 Other muscle spasm: Secondary | ICD-10-CM

## 2023-08-18 ENCOUNTER — Telehealth: Payer: Self-pay | Admitting: Physician Assistant

## 2023-08-18 NOTE — Telephone Encounter (Signed)
 Otolaryngology appointment 08/19/2023 @ [facility]

## 2023-08-19 DIAGNOSIS — H6123 Impacted cerumen, bilateral: Secondary | ICD-10-CM | POA: Diagnosis not present

## 2023-08-19 DIAGNOSIS — H93292 Other abnormal auditory perceptions, left ear: Secondary | ICD-10-CM | POA: Diagnosis not present

## 2023-09-15 ENCOUNTER — Other Ambulatory Visit: Payer: Self-pay

## 2023-09-15 DIAGNOSIS — F411 Generalized anxiety disorder: Secondary | ICD-10-CM

## 2023-09-15 MED ORDER — BUPROPION HCL ER (XL) 150 MG PO TB24: ORAL_TABLET | ORAL | 1 refills | Status: DC

## 2023-11-11 ENCOUNTER — Ambulatory Visit (INDEPENDENT_AMBULATORY_CARE_PROVIDER_SITE_OTHER): Payer: No Typology Code available for payment source | Admitting: Physician Assistant

## 2023-11-11 ENCOUNTER — Encounter: Payer: Self-pay | Admitting: Physician Assistant

## 2023-11-11 VITALS — BP 130/80 | HR 71 | Temp 97.8°F | Resp 16 | Ht 72.0 in | Wt 276.0 lb

## 2023-11-11 DIAGNOSIS — F17210 Nicotine dependence, cigarettes, uncomplicated: Secondary | ICD-10-CM | POA: Diagnosis not present

## 2023-11-11 DIAGNOSIS — E782 Mixed hyperlipidemia: Secondary | ICD-10-CM

## 2023-11-11 DIAGNOSIS — F411 Generalized anxiety disorder: Secondary | ICD-10-CM | POA: Diagnosis not present

## 2023-11-11 DIAGNOSIS — I10 Essential (primary) hypertension: Secondary | ICD-10-CM

## 2023-11-11 NOTE — Progress Notes (Signed)
 [facility] [address] [place], [place] [address]  Internal MEDICINE  Office Visit Note  Patient Name: Zachary Holmes  [id number]  [id number]  Date of Service: 11/11/2023  Chief Complaint  Patient presents with   Follow-up   Hypertension   Depression    HPI Pt is here for routine follow up -reports he is doing well, no concerns today -BP stable -grouping meds together at the pharmacy now so he doesn't have to make as many trips -1/4ppd smoking. Taking wellbutrin and has been working to quit. He states he thinks one day soon he will just be able to stop like he did with alcohol -going to chiropractor next week for his back since it has been bothering him more lately. He does take tramadol as needed -did see ENT, reports they were able to take out a large amount of wax and has been much better since  Current Medication: Outpatient Encounter Medications as of 11/11/2023  Medication Sig   ALPRAZolam (XANAX) 0.5 MG tablet Take 1 tablet by mouth daily as needed for anxiety   amLODipine (NORVASC) 5 MG tablet TAKE 1 TABLET(5 MG) BY MOUTH DAILY   buPROPion (WELLBUTRIN XL) 150 MG 24 hr tablet TAKE 1 TABLET BY MOUTH EVERY DAY IN THE MORNING FOR ANXIETY   cyclobenzaprine (FLEXERIL) 10 MG tablet TAKE 1 TABLET BY MOUTH AT BEDTIME AS NEEDED   ezetimibe (ZETIA) 10 MG tablet TAKE 1 TABLET(10 MG) BY MOUTH DAILY   meloxicam (MOBIC) 15 MG tablet Take 15 mg by mouth daily.   omeprazole (PRILOSEC) 40 MG capsule TAKE 1 CAPSULE(40 MG) BY MOUTH DAILY   Tdap (ADACEL) 12-02-13.5 LF-MCG/0.5 injection Inject 0.5 mLs into the muscle once.   traMADol (ULTRAM) 50 MG tablet Take 1 tablet (50 mg total) by mouth 2 (two) times daily as needed. for pain   [DISCONTINUED] nicotine (NICODERM CQ - DOSED IN MG/24 HOURS) 14 mg/24hr patch Place 1 patch (14 mg total) onto the skin daily. For 3-4 weeks then decrease to 7mg  patches.   No facility-administered encounter medications on file as of 11/11/2023.     Surgical History: Past Surgical History:  Procedure Laterality Date   ANKLE FUSION Right 11/24/2007   Hattie Perch 12/04/2010   COLONOSCOPY WITH PROPOFOL N/A 05/13/2017   Procedure: COLONOSCOPY WITH PROPOFOL;  Surgeon: Midge Minium, MD;  Location: [facility];  Service: Endoscopy;  Laterality: N/A;  requests early   COLONOSCOPY WITH PROPOFOL N/A 12/04/2021   Procedure: COLONOSCOPY WITH BIOPSIES;  Surgeon: Midge Minium, MD;  Location: [facility];  Service: Endoscopy;  Laterality: N/A;   FRACTURE SURGERY     JOINT REPLACEMENT     ankle-right, knee-left, both shoulders   KNEE ARTHROCENTESIS Left X 5   "got infected . . ."   MULTIPLE TOOTH EXTRACTIONS     POLYPECTOMY N/A 12/04/2021   Procedure: POLYPECTOMY;  Surgeon: Midge Minium, MD;  Location: [facility];  Service: Endoscopy;  Laterality: N/A;   SHOULDER ARTHROSCOPY Left 06/2007   /notes 12/03/2010   TOTAL KNEE ARTHROPLASTY Left 2008   /notes 04/09/2010   TOTAL SHOULDER ARTHROPLASTY Left 09/20/2014   TOTAL SHOULDER ARTHROPLASTY Left 09/20/2014   Procedure: TOTAL SHOULDER ARTHROPLASTY;  Surgeon: Mable Paris, MD;  Location: [facility] OR;  Service: Orthopedics;  Laterality: Left;  Left total shoulder arthroplasty   TOTAL SHOULDER ARTHROPLASTY Right 06/02/2018   Procedure: RIGHT TOTAL SHOULDER ARTHROPLASTY;  Surgeon: Jones Broom, MD;  Location: [facility] OR;  Service: Orthopedics;  Laterality: Right;  Medical History: Past Medical History:  Diagnosis Date   Anxiety    Arthritis    "legs" (09/20/2014)   Depression    GERD (gastroesophageal reflux disease)    Hypertension    controlled on meds   OA (osteoarthritis) of shoulder    right   Seizures (HCC) 1983   x1, S/P head injury   Serratia marcescens infection 2011   left knee   Umbilical hernia    Wears dentures    partial upper    Family History: Family History  Problem Relation Age of Onset   COPD Mother    Heart disease Father     Social History    Socioeconomic History   Marital status: Single    Spouse name: Not on file   Number of children: Not on file   Years of education: Not on file   Highest education level: Not on file  Occupational History   Not on file  Tobacco Use   Smoking status: Every Day    Current packs/day: 0.50    Average packs/day: 0.5 packs/day for 20.0 years (10.0 ttl pk-yrs)    Types: Cigarettes   Smokeless tobacco: Never   Tobacco comments:    1 pack every 2 days  Vaping Use   Vaping status: Never Used  Substance and Sexual Activity   Alcohol use: Not Currently    Alcohol/week: 12.0 standard drinks of alcohol    Types: 12 Cans of beer per week    Comment: SOBER SINCE 05/17/2017   Drug use: Yes    Types: Marijuana    Comment: DAILY   Sexual activity: Not Currently  Other Topics Concern   Not on file  Social History Narrative   Not on file   Social Drivers of Health   Financial Resource Strain: Low Risk  (04/04/2021)   Overall Financial Resource Strain (CARDIA)    Difficulty of Paying Living Expenses: Not very hard  Food Insecurity: Not on file  Transportation Needs: Not on file  Physical Activity: Not on file  Stress: Not on file  Social Connections: Not on file  Intimate Partner Violence: Not on file      Review of Systems  Constitutional:  Negative for chills, fatigue and unexpected weight change.  HENT:  Negative for congestion, postnasal drip, rhinorrhea, sneezing and sore throat.   Eyes:  Negative for redness.  Respiratory:  Negative for cough, chest tightness and shortness of breath.   Cardiovascular:  Negative for chest pain and palpitations.  Gastrointestinal:  Negative for abdominal pain, constipation, diarrhea, nausea and vomiting.  Genitourinary:  Negative for dysuria and frequency.  Musculoskeletal:  Positive for arthralgias and back pain. Negative for joint swelling, myalgias and neck pain.  Skin:  Negative for rash.  Neurological: Negative.  Negative for tremors and  numbness.  Hematological:  Negative for adenopathy. Does not bruise/bleed easily.  Psychiatric/Behavioral:  Negative for behavioral problems (Depression), sleep disturbance and suicidal ideas. The patient is nervous/anxious.     Vital Signs: BP 130/80   Holmes 71   Temp 97.8 F (36.6 C)   Resp 16   Ht 6' (1.829 m)   Wt 276 lb (125.2 kg)   SpO2 96%   BMI 37.43 kg/m    Physical Exam Vitals and nursing note reviewed.  Constitutional:      General: He is not in acute distress.    Appearance: He is well-developed. He is obese. He is not diaphoretic.  HENT:     Head: Normocephalic  and atraumatic.  Eyes:     Pupils: Pupils are equal, round, and reactive to light.  Neck:     Thyroid: No thyromegaly.     Vascular: No JVD.     Trachea: No tracheal deviation.  Cardiovascular:     Rate and Rhythm: Normal rate and regular rhythm.     Heart sounds: Normal heart sounds. No murmur heard.    No friction rub. No gallop.  Pulmonary:     Effort: Pulmonary effort is normal. No respiratory distress.     Breath sounds: No wheezing or rales.  Chest:     Chest wall: No tenderness.  Musculoskeletal:        General: Normal range of motion.     Cervical back: Normal range of motion and neck supple.     Comments: Chronic pain throughout legs, especially knees and back with movement.   Lymphadenopathy:     Cervical: No cervical adenopathy.  Skin:    General: Skin is warm and dry.  Neurological:     Mental Status: He is alert and oriented to person, place, and time.  Psychiatric:        Behavior: Behavior normal.        Thought Content: Thought content normal.        Judgment: Judgment normal.        Assessment/Plan: 1. Essential hypertension, benign (Primary) Stable, continue current medication  2. Mixed hyperlipidemia Continue zetia and working on diet  3. GAD (generalized anxiety disorder) May continue wellbutrin, and xanax prn  4. Cigarette nicotine dependence without  complication Continue to work on smoking cessation   General Counseling: Mendell verbalizes understanding of the findings of todays visit and agrees with plan of treatment. I have discussed any further diagnostic evaluation that may be needed or ordered today. We also reviewed his medications today. he has been encouraged to call the office with any questions or concerns that should arise related to todays visit.    No orders of the defined types were placed in this encounter.   No orders of the defined types were placed in this encounter.   This patient was seen by Lynn Ito, PA-C in collaboration with Dr. Beverely Risen as a part of collaborative care agreement.   Total time spent:30 Minutes Time spent includes review of chart, medications, test results, and follow up plan with the patient.      Dr Lyndon Code Internal medicine

## 2023-11-16 ENCOUNTER — Other Ambulatory Visit: Payer: Self-pay | Admitting: Physician Assistant

## 2023-11-16 DIAGNOSIS — M62838 Other muscle spasm: Secondary | ICD-10-CM

## 2024-02-10 ENCOUNTER — Encounter: Payer: Self-pay | Admitting: Physician Assistant

## 2024-02-10 ENCOUNTER — Other Ambulatory Visit: Payer: Self-pay | Admitting: Physician Assistant

## 2024-02-10 ENCOUNTER — Ambulatory Visit: Admitting: Physician Assistant

## 2024-02-10 VITALS — BP 132/78 | HR 79 | Temp 98.7°F | Resp 16 | Ht 72.0 in | Wt 270.8 lb

## 2024-02-10 DIAGNOSIS — R21 Rash and other nonspecific skin eruption: Secondary | ICD-10-CM

## 2024-02-10 DIAGNOSIS — R7303 Prediabetes: Secondary | ICD-10-CM

## 2024-02-10 DIAGNOSIS — I10 Essential (primary) hypertension: Secondary | ICD-10-CM

## 2024-02-10 DIAGNOSIS — E782 Mixed hyperlipidemia: Secondary | ICD-10-CM

## 2024-02-10 DIAGNOSIS — L989 Disorder of the skin and subcutaneous tissue, unspecified: Secondary | ICD-10-CM

## 2024-02-10 DIAGNOSIS — G8928 Other chronic postprocedural pain: Secondary | ICD-10-CM

## 2024-02-10 LAB — POCT GLYCOSYLATED HEMOGLOBIN (HGB A1C): Hemoglobin A1C: 5.3 % (ref 4.0–5.6)

## 2024-02-10 MED ORDER — TRIAMCINOLONE ACETONIDE 0.1 % EX CREA: 1.0000 | TOPICAL_CREAM | Freq: Two times a day (BID) | CUTANEOUS | 0 refills | Status: DC

## 2024-02-10 NOTE — Telephone Encounter (Signed)
 Please review and send

## 2024-02-10 NOTE — Progress Notes (Signed)
 [facility] [address] [place], [place] [address]  Internal MEDICINE  Office Visit Note  Patient Name: Zachary Holmes  [id number]  [id number]  Date of Service: 02/10/2024  Chief Complaint  Patient presents with   Follow-up   Depression   Gastroesophageal Reflux   Hypertension    HPI Pt is here for routine follow up -Bp stable -Losing weight and A1c now improved to normal range -left arm rash in antecubital fossa,  -was trimming hedges and felt something on arm and started noticing some bumps along arm that were itchy. Worst is in fold now due to scratching and location with bending arm. Has not tried any creams  -unsure if sting or scrape. Did not actually see anything bite him or cut him. This was about 3 weeks ago -tried pouring gas, and car cleaner on area initially thinking he needed to kill infection/possible bug?? -states it has gotten a little better, no pus draining or red streaks as signs of infection. He has been watching out for this as he has had a septic joint previously that led to streaking up leg -Discussed not using harsh chemicals and trial kenalog  cream while montoring. Will also place dermatology referral in case not improving as well as for full skin check as he has a few areas of concerns on head as well  Current Medication: Outpatient Encounter Medications as of 02/10/2024  Medication Sig   ALPRAZolam  (XANAX ) 0.5 MG tablet Take 1 tablet by mouth daily as needed for anxiety   amLODipine  (NORVASC ) 5 MG tablet TAKE 1 TABLET(5 MG) BY MOUTH DAILY   buPROPion  (WELLBUTRIN  XL) 150 MG 24 hr tablet TAKE 1 TABLET BY MOUTH EVERY DAY IN THE MORNING FOR ANXIETY   cyclobenzaprine  (FLEXERIL ) 10 MG tablet TAKE 1 TABLET BY MOUTH AT BEDTIME AS NEEDED   ezetimibe  (ZETIA ) 10 MG tablet TAKE 1 TABLET(10 MG) BY MOUTH DAILY   meloxicam  (MOBIC ) 15 MG tablet Take 15 mg by mouth daily.   omeprazole  (PRILOSEC) 40 MG capsule TAKE 1 CAPSULE(40 MG) BY MOUTH DAILY   Tdap  (ADACEL) 12-02-13.5 LF-MCG/0.5 injection Inject 0.5 mLs into the muscle once.   triamcinolone  cream (KENALOG ) 0.1 % Apply 1 Application topically 2 (two) times daily.   [DISCONTINUED] traMADol  (ULTRAM ) 50 MG tablet Take 1 tablet (50 mg total) by mouth 2 (two) times daily as needed. for pain   No facility-administered encounter medications on file as of 02/10/2024.    Surgical History: Past Surgical History:  Procedure Laterality Date   ANKLE FUSION Right 11/24/2007   thelbert 12/04/2010   COLONOSCOPY WITH PROPOFOL  N/A 05/13/2017   Procedure: COLONOSCOPY WITH PROPOFOL ;  Surgeon: Jinny Carmine, MD;  Location: [facility];  Service: Endoscopy;  Laterality: N/A;  requests early   COLONOSCOPY WITH PROPOFOL  N/A 12/04/2021   Procedure: COLONOSCOPY WITH BIOPSIES;  Surgeon: Jinny Carmine, MD;  Location: [facility];  Service: Endoscopy;  Laterality: N/A;   FRACTURE SURGERY     JOINT REPLACEMENT     ankle-right, knee-left, both shoulders   KNEE ARTHROCENTESIS Left X 5   got infected . . .   MULTIPLE TOOTH EXTRACTIONS     POLYPECTOMY N/A 12/04/2021   Procedure: POLYPECTOMY;  Surgeon: Jinny Carmine, MD;  Location: [facility];  Service: Endoscopy;  Laterality: N/A;   SHOULDER ARTHROSCOPY Left 06/2007   /notes 12/03/2010   TOTAL KNEE ARTHROPLASTY Left 2008   /notes 04/09/2010   TOTAL SHOULDER ARTHROPLASTY Left 09/20/2014   TOTAL SHOULDER ARTHROPLASTY Left  09/20/2014   Procedure: TOTAL SHOULDER ARTHROPLASTY;  Surgeon: Eva Elsie Herring, MD;  Location: [facility] OR;  Service: Orthopedics;  Laterality: Left;  Left total shoulder arthroplasty   TOTAL SHOULDER ARTHROPLASTY Right 06/02/2018   Procedure: RIGHT TOTAL SHOULDER ARTHROPLASTY;  Surgeon: Herring Eva, MD;  Location: [facility] OR;  Service: Orthopedics;  Laterality: Right;    Medical History: Past Medical History:  Diagnosis Date   Anxiety    Arthritis    legs (09/20/2014)   Depression    GERD (gastroesophageal reflux disease)     Hypertension    controlled on meds   OA (osteoarthritis) of shoulder    right   Seizures (HCC) 1983   x1, S/P head injury   Serratia marcescens infection 2011   left knee   Umbilical hernia    Wears dentures    partial upper    Family History: Family History  Problem Relation Age of Onset   COPD Mother    Heart disease Father     Social History   Socioeconomic History   Marital status: Single    Spouse name: Not on file   Number of children: Not on file   Years of education: Not on file   Highest education level: Not on file  Occupational History   Not on file  Tobacco Use   Smoking status: Every Day    Current packs/day: 0.50    Average packs/day: 0.5 packs/day for 20.0 years (10.0 ttl pk-yrs)    Types: Cigarettes   Smokeless tobacco: Never   Tobacco comments:    1 pack every 2 days  Vaping Use   Vaping status: Never Used  Substance and Sexual Activity   Alcohol use: Not Currently    Alcohol/week: 12.0 standard drinks of alcohol    Types: 12 Cans of beer per week    Comment: SOBER SINCE 05/17/2017   Drug use: Yes    Types: Marijuana    Comment: DAILY   Sexual activity: Not Currently  Other Topics Concern   Not on file  Social History Narrative   Not on file   Social Drivers of Health   Financial Resource Strain: Low Risk  (04/04/2021)   Overall Financial Resource Strain (CARDIA)    Difficulty of Paying Living Expenses: Not very hard  Food Insecurity: Not on file  Transportation Needs: Not on file  Physical Activity: Not on file  Stress: Not on file  Social Connections: Not on file  Intimate Partner Violence: Not on file      Review of Systems  Constitutional:  Negative for chills, fatigue and unexpected weight change.  HENT:  Negative for congestion, postnasal drip, rhinorrhea, sneezing and sore throat.   Eyes:  Negative for redness.  Respiratory:  Negative for cough, chest tightness and shortness of breath.   Cardiovascular:  Negative for  chest pain and palpitations.  Gastrointestinal:  Negative for abdominal pain, constipation, diarrhea, nausea and vomiting.  Genitourinary:  Negative for dysuria and frequency.  Musculoskeletal:  Positive for arthralgias and back pain. Negative for joint swelling, myalgias and neck pain.  Skin:  Positive for rash.  Neurological: Negative.  Negative for tremors and numbness.  Hematological:  Negative for adenopathy. Does not bruise/bleed easily.  Psychiatric/Behavioral:  Negative for behavioral problems (Depression), sleep disturbance and suicidal ideas.     Vital Signs: BP 132/78 Comment: 124/90  Pulse 79   Temp 98.7 F (37.1 C)   Resp 16   Ht 6' (1.829 m)   Wt 270 lb  12.8 oz (122.8 kg)   SpO2 92%   BMI 36.73 kg/m    Physical Exam Vitals and nursing note reviewed.  Constitutional:      General: He is not in acute distress.    Appearance: He is well-developed. He is obese. He is not diaphoretic.  HENT:     Head: Normocephalic and atraumatic.  Eyes:     Pupils: Pupils are equal, round, and reactive to light.  Neck:     Thyroid : No thyromegaly.     Vascular: No JVD.     Trachea: No tracheal deviation.  Cardiovascular:     Rate and Rhythm: Normal rate and regular rhythm.     Heart sounds: Normal heart sounds. No murmur heard.    No friction rub. No gallop.  Pulmonary:     Effort: Pulmonary effort is normal. No respiratory distress.     Breath sounds: No wheezing or rales.  Chest:     Chest wall: No tenderness.  Musculoskeletal:        General: Normal range of motion.     Cervical back: Normal range of motion and neck supple.     Comments: Chronic pain throughout legs, especially knees and back with movement.   Lymphadenopathy:     Cervical: No cervical adenopathy.  Skin:    General: Skin is warm and dry.     Findings: Rash present.     Comments: Rash in and around left antecubital fossa, no erythema surrounding or swelling  Neurological:     Mental Status: He is  alert and oriented to person, place, and time.  Psychiatric:        Behavior: Behavior normal.        Thought Content: Thought content normal.        Judgment: Judgment normal.        Assessment/Plan: 1. Rash and other nonspecific skin eruption (Primary) Vies to avoid harsh chemicals and will try Kenalog  cream.  Monitor for any signs of infections and call with any concerns.  Will also place referral to dermatology in case is not improving as well as to address other skin concerns - Ambulatory referral to Dermatology  2. Skin lesion on examination Several areas of concern on skin particularly on face and scalp.  Will refer patient for full skin exam - Ambulatory referral to Dermatology  3. Essential hypertension, benign Well-controlled continue current medication  4. Mixed hyperlipidemia Continue Zetia  as before  5. Prediabetes - POCT HgB A1C is 5.3 and is now in normal range.  Encouraged to continue to work on diet and exercise   General Counseling: domenic schoenberger understanding of the findings of todays visit and agrees with plan of treatment. I have discussed any further diagnostic evaluation that may be needed or ordered today. We also reviewed his medications today. he has been encouraged to call the office with any questions or concerns that should arise related to todays visit.    Orders Placed This Encounter  Procedures   Ambulatory referral to Dermatology   POCT HgB A1C    Meds ordered this encounter  Medications   triamcinolone  cream (KENALOG ) 0.1 %    Sig: Apply 1 Application topically 2 (two) times daily.    Dispense:  30 g    Refill:  0    This patient was seen by Tinnie Pro, PA-C in collaboration with Dr. Sigrid Bathe as a part of collaborative care agreement.   Total time spent:30 Minutes Time spent includes review of chart,  medications, test results, and follow up plan with the patient.      Dr Fozia M Khan Internal medicine

## 2024-02-14 ENCOUNTER — Other Ambulatory Visit: Payer: Self-pay | Admitting: Physician Assistant

## 2024-02-14 DIAGNOSIS — M62838 Other muscle spasm: Secondary | ICD-10-CM

## 2024-02-15 ENCOUNTER — Telehealth: Payer: Self-pay | Admitting: Physician Assistant

## 2024-02-15 NOTE — Telephone Encounter (Signed)
 Dermatology referral sent via [facility] to [facility].  Lvm notifying patient. Gave telephone# [phone]

## 2024-02-22 ENCOUNTER — Telehealth: Payer: Self-pay | Admitting: Physician Assistant

## 2024-02-22 NOTE — Telephone Encounter (Signed)
 Dermatology appointment 04/26/2024 @ [facility]

## 2024-03-06 ENCOUNTER — Other Ambulatory Visit: Payer: Self-pay

## 2024-03-06 DIAGNOSIS — K219 Gastro-esophageal reflux disease without esophagitis: Secondary | ICD-10-CM

## 2024-03-06 MED ORDER — OMEPRAZOLE 40 MG PO CPDR: DELAYED_RELEASE_CAPSULE | ORAL | 1 refills | Status: DC

## 2024-03-20 ENCOUNTER — Other Ambulatory Visit: Payer: Self-pay | Admitting: Physician Assistant

## 2024-03-20 DIAGNOSIS — I10 Essential (primary) hypertension: Secondary | ICD-10-CM

## 2024-03-23 ENCOUNTER — Other Ambulatory Visit: Payer: Self-pay | Admitting: Physician Assistant

## 2024-03-23 DIAGNOSIS — F411 Generalized anxiety disorder: Secondary | ICD-10-CM

## 2024-03-23 NOTE — Telephone Encounter (Signed)
 Please send

## 2024-04-16 ENCOUNTER — Other Ambulatory Visit: Payer: Self-pay | Admitting: Physician Assistant

## 2024-04-16 DIAGNOSIS — E782 Mixed hyperlipidemia: Secondary | ICD-10-CM

## 2024-04-26 DIAGNOSIS — D2272 Melanocytic nevi of left lower limb, including hip: Secondary | ICD-10-CM | POA: Diagnosis not present

## 2024-04-26 DIAGNOSIS — L281 Prurigo nodularis: Secondary | ICD-10-CM | POA: Diagnosis not present

## 2024-04-26 DIAGNOSIS — D2261 Melanocytic nevi of right upper limb, including shoulder: Secondary | ICD-10-CM | POA: Diagnosis not present

## 2024-04-26 DIAGNOSIS — L821 Other seborrheic keratosis: Secondary | ICD-10-CM | POA: Diagnosis not present

## 2024-04-26 DIAGNOSIS — D225 Melanocytic nevi of trunk: Secondary | ICD-10-CM | POA: Diagnosis not present

## 2024-04-26 DIAGNOSIS — L814 Other melanin hyperpigmentation: Secondary | ICD-10-CM | POA: Diagnosis not present

## 2024-04-26 DIAGNOSIS — L570 Actinic keratosis: Secondary | ICD-10-CM | POA: Diagnosis not present

## 2024-04-26 DIAGNOSIS — D2271 Melanocytic nevi of right lower limb, including hip: Secondary | ICD-10-CM | POA: Diagnosis not present

## 2024-04-26 DIAGNOSIS — D2262 Melanocytic nevi of left upper limb, including shoulder: Secondary | ICD-10-CM | POA: Diagnosis not present

## 2024-05-06 ENCOUNTER — Other Ambulatory Visit: Payer: Self-pay | Admitting: Physician Assistant

## 2024-05-06 DIAGNOSIS — G8928 Other chronic postprocedural pain: Secondary | ICD-10-CM

## 2024-05-12 ENCOUNTER — Ambulatory Visit (INDEPENDENT_AMBULATORY_CARE_PROVIDER_SITE_OTHER): Admitting: Physician Assistant

## 2024-05-12 ENCOUNTER — Encounter: Payer: Self-pay | Admitting: Physician Assistant

## 2024-05-12 VITALS — BP 132/88 | HR 88 | Temp 97.7°F | Resp 16 | Ht 72.0 in | Wt 270.0 lb

## 2024-05-12 DIAGNOSIS — W19XXXA Unspecified fall, initial encounter: Secondary | ICD-10-CM

## 2024-05-12 DIAGNOSIS — I10 Essential (primary) hypertension: Secondary | ICD-10-CM

## 2024-05-12 DIAGNOSIS — F411 Generalized anxiety disorder: Secondary | ICD-10-CM | POA: Diagnosis not present

## 2024-05-12 MED ORDER — BUPROPION HCL ER (XL) 150 MG PO TB24: ORAL_TABLET | ORAL | 1 refills | Status: DC

## 2024-05-12 NOTE — Progress Notes (Signed)
 [facility] [address] [place], [place] [address]  Internal MEDICINE  Office Visit Note  Patient Name: Zachary Holmes  [id number]  [id number]  Date of Service: 05/12/2024  Chief Complaint  Patient presents with   Follow-up   Depression   Gastroesophageal Reflux   Hypertension   Medication Refill   Felton Rasmussen 2 weeks ago    HPI Pt is here for routine follow up -Had a fall 2 weeks ago, fell on saw horse -caught himself under left arm, scraped head but no LOC or headaches or vision changes. Pain along side initially, but now able to take deep breaths. No blood in urine, stool, or sputum. No abdominal pain now. He declines any imaging studies to evaluate further -right middle finger DIP likely broken but declines any imaging or follow up, not bothering him enough. Unable to completely extend at DIP though states it was already somewhat like that. Discussed likely tendon out of place with possible fracture. Discussed need for ortho, but he declines.  -did take more of his tramadol  regularly initially but back to prn -did see dermatology and states he will be having 1 year follow up -Bp stable  Current Medication: Outpatient Encounter Medications as of 05/12/2024  Medication Sig   ALPRAZolam  (XANAX ) 0.5 MG tablet TAKE 1 TABLET BY MOUTH DAILY AS NEEDED FOR ANXIETY   amLODipine  (NORVASC ) 5 MG tablet TAKE 1 TABLET(5 MG) BY MOUTH DAILY   buPROPion  (WELLBUTRIN  XL) 150 MG 24 hr tablet TAKE 1 TABLET BY MOUTH EVERY DAY IN THE MORNING FOR ANXIETY   cyclobenzaprine  (FLEXERIL ) 10 MG tablet TAKE 1 TABLET BY MOUTH AT BEDTIME AS NEEDED   ezetimibe  (ZETIA ) 10 MG tablet TAKE 1 TABLET(10 MG) BY MOUTH DAILY   meloxicam  (MOBIC ) 15 MG tablet Take 15 mg by mouth daily.   omeprazole  (PRILOSEC) 40 MG capsule TAKE 1 CAPSULE(40 MG) BY MOUTH DAILY   traMADol  (ULTRAM ) 50 MG tablet Take 1 tablet (50 mg total) by mouth 2 (two) times daily as needed. for pain   [DISCONTINUED] Tdap (ADACEL)  12-02-13.5 LF-MCG/0.5 injection Inject 0.5 mLs into the muscle once.   [DISCONTINUED] triamcinolone  cream (KENALOG ) 0.1 % Apply 1 Application topically 2 (two) times daily.   No facility-administered encounter medications on file as of 05/12/2024.    Surgical History: Past Surgical History:  Procedure Laterality Date   ANKLE FUSION Right 11/24/2007   thelbert 12/04/2010   COLONOSCOPY WITH PROPOFOL  N/A 05/13/2017   Procedure: COLONOSCOPY WITH PROPOFOL ;  Surgeon: Jinny Carmine, MD;  Location: [facility];  Service: Endoscopy;  Laterality: N/A;  requests early   COLONOSCOPY WITH PROPOFOL  N/A 12/04/2021   Procedure: COLONOSCOPY WITH BIOPSIES;  Surgeon: Jinny Carmine, MD;  Location: [facility];  Service: Endoscopy;  Laterality: N/A;   FRACTURE SURGERY     JOINT REPLACEMENT     ankle-right, knee-left, both shoulders   KNEE ARTHROCENTESIS Left X 5   got infected . . .   MULTIPLE TOOTH EXTRACTIONS     POLYPECTOMY N/A 12/04/2021   Procedure: POLYPECTOMY;  Surgeon: Jinny Carmine, MD;  Location: [facility];  Service: Endoscopy;  Laterality: N/A;   SHOULDER ARTHROSCOPY Left 06/2007   /notes 12/03/2010   TOTAL KNEE ARTHROPLASTY Left 2008   /notes 04/09/2010   TOTAL SHOULDER ARTHROPLASTY Left 09/20/2014   TOTAL SHOULDER ARTHROPLASTY Left 09/20/2014   Procedure: TOTAL SHOULDER ARTHROPLASTY;  Surgeon: Eva Elsie Herring, MD;  Location: [facility] OR;  Service: Orthopedics;  Laterality: Left;  Left total shoulder arthroplasty   TOTAL SHOULDER ARTHROPLASTY Right 06/02/2018   Procedure: RIGHT TOTAL SHOULDER ARTHROPLASTY;  Surgeon: Dozier Soulier, MD;  Location: [facility] OR;  Service: Orthopedics;  Laterality: Right;    Medical History: Past Medical History:  Diagnosis Date   Anxiety    Arthritis    legs (09/20/2014)   Depression    GERD (gastroesophageal reflux disease)    Hypertension    controlled on meds   OA (osteoarthritis) of shoulder    right   Seizures (HCC) 1983   x1, S/P  head injury   Serratia marcescens infection 2011   left knee   Umbilical hernia    Wears dentures    partial upper    Family History: Family History  Problem Relation Age of Onset   COPD Mother    Heart disease Father     Social History   Socioeconomic History   Marital status: Single    Spouse name: Not on file   Number of children: Not on file   Years of education: Not on file   Highest education level: Not on file  Occupational History   Not on file  Tobacco Use   Smoking status: Every Day    Current packs/day: 0.50    Average packs/day: 0.5 packs/day for 20.0 years (10.0 ttl pk-yrs)    Types: Cigarettes   Smokeless tobacco: Never   Tobacco comments:    1 pack every 2 days  Vaping Use   Vaping status: Never Used  Substance and Sexual Activity   Alcohol use: Not Currently    Alcohol/week: 12.0 standard drinks of alcohol    Types: 12 Cans of beer per week    Comment: SOBER SINCE 05/17/2017   Drug use: Yes    Types: Marijuana    Comment: DAILY   Sexual activity: Not Currently  Other Topics Concern   Not on file  Social History Narrative   Not on file   Social Drivers of Health   Financial Resource Strain: Low Risk  (04/04/2021)   Overall Financial Resource Strain (CARDIA)    Difficulty of Paying Living Expenses: Not very hard  Food Insecurity: Not on file  Transportation Needs: Not on file  Physical Activity: Not on file  Stress: Not on file  Social Connections: Not on file  Intimate Partner Violence: Not on file      Review of Systems  Constitutional:  Negative for chills, fatigue and unexpected weight change.  HENT:  Negative for congestion, postnasal drip, rhinorrhea, sneezing and sore throat.   Eyes:  Negative for redness.  Respiratory:  Negative for cough, chest tightness and shortness of breath.   Cardiovascular:  Negative for chest pain and palpitations.  Gastrointestinal:  Negative for abdominal pain, blood in stool, constipation, diarrhea,  nausea and vomiting.  Genitourinary:  Negative for dysuria, frequency and hematuria.  Musculoskeletal:  Positive for arthralgias, back pain and myalgias. Negative for joint swelling and neck pain.  Skin:  Negative for rash.  Neurological: Negative.  Negative for tremors and numbness.  Hematological:  Negative for adenopathy. Does not bruise/bleed easily.  Psychiatric/Behavioral:  Negative for behavioral problems (Depression), sleep disturbance and suicidal ideas.     Vital Signs: BP (!) 147/92   Pulse 88   Temp 97.7 F (36.5 C)   Resp 16   Ht 6' (1.829 m)   Wt 270 lb (122.5 kg)   SpO2 94%   BMI 36.62 kg/m    Physical Exam Vitals and nursing note  reviewed.  Constitutional:      General: He is not in acute distress.    Appearance: He is well-developed. He is obese. He is not diaphoretic.  HENT:     Head: Normocephalic and atraumatic.  Eyes:     Pupils: Pupils are equal, round, and reactive to light.  Neck:     Thyroid : No thyromegaly.     Vascular: No JVD.     Trachea: No tracheal deviation.  Cardiovascular:     Rate and Rhythm: Normal rate and regular rhythm.     Heart sounds: Normal heart sounds. No murmur heard.    No friction rub. No gallop.  Pulmonary:     Effort: Pulmonary effort is normal. No respiratory distress.     Breath sounds: No wheezing or rales.  Chest:     Chest wall: No tenderness.  Abdominal:     Tenderness: There is no abdominal tenderness.  Musculoskeletal:        General: Deformity and signs of injury present. Normal range of motion.     Cervical back: Normal range of motion and neck supple.     Comments: Chronic pain throughout legs, especially knees and back with movement. Right middle DIP without full extension.   Lymphadenopathy:     Cervical: No cervical adenopathy.  Skin:    General: Skin is warm and dry.  Neurological:     Mental Status: He is alert and oriented to person, place, and time.  Psychiatric:        Behavior: Behavior  normal.        Thought Content: Thought content normal.        Judgment: Judgment normal.        Assessment/Plan: 1. Essential hypertension, benign (Primary) Well controlled, continue current medications  2. GAD (generalized anxiety disorder) Continue wellbutrin  daily. May use xanax  prn - buPROPion  (WELLBUTRIN  XL) 150 MG 24 hr tablet; TAKE 1 TABLET BY MOUTH EVERY DAY IN THE MORNING FOR ANXIETY  Dispense: 90 tablet; Refill: 1  3. Injury due to fall, initial encounter Fall 2 weeks ago without LOC. Injury to left side and right middle DIP. Patient did not seek care after injury and declines any further evaluation or imaging at this time. He is feeling better. Recommended xray/ortho for finger, but he declines. Call if any changes   General Counseling: raylen tangonan understanding of the findings of todays visit and agrees with plan of treatment. I have discussed any further diagnostic evaluation that may be needed or ordered today. We also reviewed his medications today. he has been encouraged to call the office with any questions or concerns that should arise related to todays visit.    No orders of the defined types were placed in this encounter.   No orders of the defined types were placed in this encounter.   This patient was seen by Tinnie Pro, PA-C in collaboration with Dr. Sigrid Bathe as a part of collaborative care agreement.   Total time spent:30 Minutes Time spent includes review of chart, medications, test results, and follow up plan with the patient.      Dr Fozia M Khan Internal medicine

## 2024-05-17 ENCOUNTER — Other Ambulatory Visit: Payer: Self-pay | Admitting: Physician Assistant

## 2024-05-17 DIAGNOSIS — M62838 Other muscle spasm: Secondary | ICD-10-CM

## 2024-06-14 ENCOUNTER — Other Ambulatory Visit: Payer: Self-pay | Admitting: Physician Assistant

## 2024-06-14 DIAGNOSIS — G8928 Other chronic postprocedural pain: Secondary | ICD-10-CM

## 2024-08-14 ENCOUNTER — Encounter: Payer: Self-pay | Admitting: Physician Assistant

## 2024-08-14 ENCOUNTER — Ambulatory Visit: Payer: No Typology Code available for payment source | Admitting: Physician Assistant

## 2024-08-14 VITALS — BP 120/72 | HR 97 | Temp 98.0°F | Resp 16 | Ht 72.0 in | Wt 278.0 lb

## 2024-08-14 DIAGNOSIS — R351 Nocturia: Secondary | ICD-10-CM | POA: Diagnosis not present

## 2024-08-14 DIAGNOSIS — G8928 Other chronic postprocedural pain: Secondary | ICD-10-CM | POA: Diagnosis not present

## 2024-08-14 DIAGNOSIS — Z1329 Encounter for screening for other suspected endocrine disorder: Secondary | ICD-10-CM | POA: Diagnosis not present

## 2024-08-14 DIAGNOSIS — R300 Dysuria: Secondary | ICD-10-CM

## 2024-08-14 DIAGNOSIS — Z125 Encounter for screening for malignant neoplasm of prostate: Secondary | ICD-10-CM

## 2024-08-14 DIAGNOSIS — Z0001 Encounter for general adult medical examination with abnormal findings: Secondary | ICD-10-CM

## 2024-08-14 DIAGNOSIS — M62838 Other muscle spasm: Secondary | ICD-10-CM | POA: Diagnosis not present

## 2024-08-14 DIAGNOSIS — R0602 Shortness of breath: Secondary | ICD-10-CM | POA: Diagnosis not present

## 2024-08-14 DIAGNOSIS — F411 Generalized anxiety disorder: Secondary | ICD-10-CM | POA: Diagnosis not present

## 2024-08-14 DIAGNOSIS — R079 Chest pain, unspecified: Secondary | ICD-10-CM

## 2024-08-14 DIAGNOSIS — E782 Mixed hyperlipidemia: Secondary | ICD-10-CM | POA: Diagnosis not present

## 2024-08-14 DIAGNOSIS — E538 Deficiency of other specified B group vitamins: Secondary | ICD-10-CM

## 2024-08-14 DIAGNOSIS — R5383 Other fatigue: Secondary | ICD-10-CM

## 2024-08-14 DIAGNOSIS — R7303 Prediabetes: Secondary | ICD-10-CM | POA: Diagnosis not present

## 2024-08-14 DIAGNOSIS — I10 Essential (primary) hypertension: Secondary | ICD-10-CM

## 2024-08-14 DIAGNOSIS — R252 Cramp and spasm: Secondary | ICD-10-CM

## 2024-08-14 DIAGNOSIS — R10A2 Flank pain, left side: Secondary | ICD-10-CM | POA: Diagnosis not present

## 2024-08-14 DIAGNOSIS — K219 Gastro-esophageal reflux disease without esophagitis: Secondary | ICD-10-CM | POA: Diagnosis not present

## 2024-08-14 MED ORDER — BUPROPION HCL ER (XL) 150 MG PO TB24: ORAL_TABLET | ORAL | 1 refills | Status: AC

## 2024-08-14 MED ORDER — TRAMADOL HCL 50 MG PO TABS: 50.0000 mg | ORAL_TABLET | Freq: Two times a day (BID) | ORAL | 0 refills | Status: AC | PRN

## 2024-08-14 MED ORDER — OMEPRAZOLE 40 MG PO CPDR: DELAYED_RELEASE_CAPSULE | ORAL | 1 refills | Status: AC

## 2024-08-14 MED ORDER — MELOXICAM 15 MG PO TABS: 15.0000 mg | ORAL_TABLET | Freq: Every day | ORAL | 1 refills | Status: AC

## 2024-08-14 MED ORDER — CYCLOBENZAPRINE HCL 10 MG PO TABS: 10.0000 mg | ORAL_TABLET | Freq: Every evening | ORAL | 2 refills | Status: AC | PRN

## 2024-08-14 MED ORDER — ALPRAZOLAM 0.5 MG PO TABS: 0.5000 mg | ORAL_TABLET | Freq: Every day | ORAL | 1 refills | Status: AC | PRN

## 2024-08-14 NOTE — Progress Notes (Unsigned)
 [facility] [address] [place], [place] [address]  Internal MEDICINE  Office Visit Note  Patient Name: Zachary Holmes  [id number]  [id number]  Date of Service: 08/14/2024  Chief Complaint  Patient presents with   Medicare Wellness   Hypertension   Gastroesophageal Reflux    HPI Zachary Holmes presents for an annual well visit Well-appearing 64 y.o. male Routine CRC screening: UTD, due in 2028 Labs: Due New or worsening pain: pain worse since running out of mobic , needs refills Other concerns: left side groin/flank pain for awhile. Can shift and cramp up as well.  -a little more SOB for awhile now. Occasional left side sharp chest pain. Wonders if some is coming from back/neck progression. May need to follow up with ortho -still having bumps on back of head, has seen derm in the past for it.  -urinating more at night now. No burning of urgency. Has been trying to hydrate more due to cramps.      08/13/2023    9:00 AM 08/07/2022    9:05 AM 07/25/2021   11:29 AM  MMSE - Mini Mental State Exam  Orientation to time 5 5 5   Orientation to Place 5 5 5   Registration 3 3 3   Attention/ Calculation 5 5 5   Recall 3 3 3   Language- name 2 objects 2 2 2   Language- repeat 1 1 1   Language- follow 3 step command 3 3 3   Language- read & follow direction 1 1 1   Write a sentence 1 1 1   Copy design 1 1 1   Total score 30 30 30     Functional Status Survey: Is the patient deaf or have difficulty hearing?: No Does the patient have difficulty seeing, even when wearing glasses/contacts?: No Does the patient have difficulty concentrating, remembering, or making decisions?: No Does the patient have difficulty walking or climbing stairs?: No Does the patient have difficulty dressing or bathing?: No Does the patient have difficulty doing errands alone such as visiting a doctor's office or shopping?: No     08/13/2023    9:00 AM 11/11/2023    9:49 AM 02/10/2024    9:19 AM 05/12/2024    10:17 AM 08/14/2024    2:00 PM  Fall Risk  Falls in the past year? 0 0 0 1 0  Was there an injury with Fall?    0    Fall Risk Category Calculator    1   Patient at Risk for Falls Due to  No Fall Risks     Fall risk Follow up  Falls evaluation completed        Data saved with a previous flowsheet row definition       08/14/2024    2:00 PM  Depression screen PHQ 2/9  Decreased Interest 0  Down, Depressed, Hopeless 0  PHQ - 2 Score 0        No data to display            Current Medication: Outpatient Encounter Medications as of 08/14/2024  Medication Sig   amLODipine  (NORVASC ) 5 MG tablet TAKE 1 TABLET(5 MG) BY MOUTH DAILY   ezetimibe  (ZETIA ) 10 MG tablet TAKE 1 TABLET(10 MG) BY MOUTH DAILY   [DISCONTINUED] ALPRAZolam  (XANAX ) 0.5 MG tablet TAKE 1 TABLET BY MOUTH DAILY AS NEEDED FOR ANXIETY   [DISCONTINUED] buPROPion  (WELLBUTRIN  XL) 150 MG 24 hr tablet TAKE 1 TABLET BY MOUTH EVERY DAY IN THE MORNING FOR ANXIETY   [DISCONTINUED] cyclobenzaprine  (FLEXERIL ) 10 MG tablet  TAKE 1 TABLET BY MOUTH AT BEDTIME AS NEEDED   [DISCONTINUED] meloxicam  (MOBIC ) 15 MG tablet Take 15 mg by mouth daily.   [DISCONTINUED] omeprazole  (PRILOSEC) 40 MG capsule TAKE 1 CAPSULE(40 MG) BY MOUTH DAILY   [DISCONTINUED] traMADol  (ULTRAM ) 50 MG tablet Take 1 tablet (50 mg total) by mouth 2 (two) times daily as needed. for pain   ALPRAZolam  (XANAX ) 0.5 MG tablet Take 1 tablet (0.5 mg total) by mouth daily as needed. for anxiety   buPROPion  (WELLBUTRIN  XL) 150 MG 24 hr tablet TAKE 1 TABLET BY MOUTH EVERY DAY IN THE MORNING FOR ANXIETY   cyclobenzaprine  (FLEXERIL ) 10 MG tablet Take 1 tablet (10 mg total) by mouth at bedtime as needed.   meloxicam  (MOBIC ) 15 MG tablet Take 1 tablet (15 mg total) by mouth daily.   omeprazole  (PRILOSEC) 40 MG capsule TAKE 1 CAPSULE(40 MG) BY MOUTH DAILY   traMADol  (ULTRAM ) 50 MG tablet Take 1 tablet (50 mg total) by mouth 2 (two) times daily as needed. for pain   No  facility-administered encounter medications on file as of 08/14/2024.    Surgical History: Past Surgical History:  Procedure Laterality Date   ANKLE FUSION Right 11/24/2007   Zachary Holmes 12/04/2010   COLONOSCOPY WITH PROPOFOL  N/A 05/13/2017   Procedure: COLONOSCOPY WITH PROPOFOL ;  Surgeon: Zachary Carmine, MD;  Location: [facility];  Service: Endoscopy;  Laterality: N/A;  requests early   COLONOSCOPY WITH PROPOFOL  N/A 12/04/2021   Procedure: COLONOSCOPY WITH BIOPSIES;  Surgeon: Zachary Carmine, MD;  Location: [facility];  Service: Endoscopy;  Laterality: N/A;   FRACTURE SURGERY     JOINT REPLACEMENT     ankle-right, knee-left, both shoulders   KNEE ARTHROCENTESIS Left X 5   got infected . . .   MULTIPLE TOOTH EXTRACTIONS     POLYPECTOMY N/A 12/04/2021   Procedure: POLYPECTOMY;  Surgeon: Zachary Carmine, MD;  Location: [facility];  Service: Endoscopy;  Laterality: N/A;   SHOULDER ARTHROSCOPY Left 06/2007   /notes 12/03/2010   TOTAL KNEE ARTHROPLASTY Left 2008   /notes 04/09/2010   TOTAL SHOULDER ARTHROPLASTY Left 09/20/2014   TOTAL SHOULDER ARTHROPLASTY Left 09/20/2014   Procedure: TOTAL SHOULDER ARTHROPLASTY;  Surgeon: Zachary Elsie Herring, MD;  Location: [facility];  Service: Orthopedics;  Laterality: Left;  Left total shoulder arthroplasty   TOTAL SHOULDER ARTHROPLASTY Right 06/02/2018   Procedure: RIGHT TOTAL SHOULDER ARTHROPLASTY;  Surgeon: Holmes Eva, MD;  Location: [facility] OR;  Service: Orthopedics;  Laterality: Right;    Medical History: Past Medical History:  Diagnosis Date   Anxiety    Arthritis    legs (09/20/2014)   Depression    GERD (gastroesophageal reflux disease)    Hypertension    controlled on meds   OA (osteoarthritis) of shoulder    right   Seizures (HCC) 1983   x1, S/P head injury   Serratia marcescens infection 2011   left knee   Umbilical hernia    Wears dentures    partial upper    Family History: Family History  Problem Relation Age  of Onset   COPD Mother    Heart disease Father     Social History   Socioeconomic History   Marital status: Single    Spouse name: Not on file   Number of children: Not on file   Years of education: Not on file   Highest education level: Not on file  Occupational History   Not on file  Tobacco Use   Smoking status:  Every Day    Current packs/day: 0.50    Average packs/day: 0.5 packs/day for 20.0 years (10.0 ttl pk-yrs)    Types: Cigarettes   Smokeless tobacco: Never   Tobacco comments:    1 pack every 2 days  Vaping Use   Vaping status: Never Used  Substance and Sexual Activity   Alcohol use: Not Currently    Alcohol/week: 12.0 standard drinks of alcohol    Types: 12 Cans of beer per week    Comment: SOBER SINCE 05/17/2017   Drug use: Yes    Types: Marijuana    Comment: DAILY   Sexual activity: Not Currently  Other Topics Concern   Not on file  Social History Narrative   Not on file   Social Drivers of Health   Tobacco Use: High Risk (08/14/2024)   Patient History    Smoking Tobacco Use: Every Day    Smokeless Tobacco Use: Never    Passive Exposure: Not on file  Financial Resource Strain: Not on file  Food Insecurity: Not on file  Transportation Needs: Not on file  Physical Activity: Not on file  Stress: Not on file  Social Connections: Not on file  Intimate Partner Violence: Not on file  Depression (PHQ2-9): Low Risk (08/14/2024)   Depression (PHQ2-9)    PHQ-2 Score: 0  Alcohol Screen: Not on file  Housing: Not on file  Utilities: Not on file  Health Literacy: Not on file      Review of Systems  Constitutional:  Negative for chills, fatigue and unexpected weight change.  HENT:  Negative for congestion, postnasal drip, rhinorrhea, sneezing and sore throat.   Eyes:  Negative for redness.  Respiratory:  Positive for shortness of breath. Negative for cough and chest tightness.   Cardiovascular:  Positive for chest pain. Negative for palpitations.   Gastrointestinal:  Positive for abdominal pain. Negative for blood in stool, constipation, diarrhea, nausea and vomiting.  Genitourinary:  Positive for flank pain. Negative for dysuria, frequency and hematuria.       Nocturia  Musculoskeletal:  Positive for arthralgias, back pain, myalgias and neck pain. Negative for joint swelling.  Skin:  Positive for rash.       Bumps on back of head again  Neurological: Negative.  Negative for tremors.  Hematological:  Negative for adenopathy. Does not bruise/bleed easily.  Psychiatric/Behavioral:  Negative for behavioral problems (Depression), sleep disturbance and suicidal ideas.     Vital Signs: BP 120/72   Pulse 97   Temp 98 F (36.7 C)   Resp 16   Ht 6' (1.829 m)   Wt 278 lb (126.1 kg)   SpO2 95%   BMI 37.70 kg/m    Physical Exam Vitals and nursing note reviewed.  Constitutional:      General: He is not in acute distress.    Appearance: He is well-developed. He is obese. He is not diaphoretic.  HENT:     Head: Normocephalic and atraumatic.  Eyes:     Pupils: Pupils are equal, round, and reactive to light.  Neck:     Thyroid : No thyromegaly.     Vascular: No JVD.     Trachea: No tracheal deviation.  Cardiovascular:     Rate and Rhythm: Normal rate and regular rhythm.     Heart sounds: Normal heart sounds. No murmur heard.    No friction rub. No gallop.  Pulmonary:     Effort: Pulmonary effort is normal. No respiratory distress.     Breath  sounds: No wheezing or rales.  Chest:     Chest wall: No tenderness.  Abdominal:     Tenderness: There is abdominal tenderness.     Comments: Tender on lower left side and flank  Musculoskeletal:        General: Deformity and signs of injury present. Normal range of motion.     Cervical back: Normal range of motion and neck supple.     Comments: Chronic pain throughout legs, especially knees and back with movement. Right middle DIP without full extension.   Lymphadenopathy:      Cervical: No cervical adenopathy.  Skin:    General: Skin is warm and dry.     Findings: Rash present.     Comments: Rash on back of head  Neurological:     Mental Status: He is alert and oriented to person, place, and time.  Psychiatric:        Behavior: Behavior normal.        Thought Content: Thought content normal.        Judgment: Judgment normal.        Assessment/Plan: 1. Encounter for Medicare annual examination with abnormal findings (Primary) AWV performed, labs ordered  2. Intermittent chest pain EKG done, will refer to cardiology. Advised to go to ED if new or worsening symptoms - CT CHEST ABDOMEN PELVIS WO CONTRAST; Future - Ambulatory referral to Cardiology - EKG 12-Lead  3. SOB (shortness of breath) Will check CT Chest  - CT CHEST ABDOMEN PELVIS WO CONTRAST; Future - Ambulatory referral to Cardiology  4. Left flank pain - CT CHEST ABDOMEN PELVIS WO CONTRAST; Future  5. Nocturia - CT CHEST ABDOMEN PELVIS WO CONTRAST; Future  6. Mixed hyperlipidemia - Lipid Panel With LDL/HDL Ratio  7. Thyroid  disorder screen - TSH + free T4  8. Screening for prostate cancer - PSA Total (Reflex To Free)  9. Prediabetes - Hgb A1C w/o eAG  10. GAD (generalized anxiety disorder) - ALPRAZolam  (XANAX ) 0.5 MG tablet; Take 1 tablet (0.5 mg total) by mouth daily as needed. for anxiety  Dispense: 30 tablet; Refill: 1 - buPROPion  (WELLBUTRIN  XL) 150 MG 24 hr tablet; TAKE 1 TABLET BY MOUTH EVERY DAY IN THE MORNING FOR ANXIETY  Dispense: 90 tablet; Refill: 1  11. Other chronic postprocedural pain May need to follow up with ortho - meloxicam  (MOBIC ) 15 MG tablet; Take 1 tablet (15 mg total) by mouth daily.  Dispense: 90 tablet; Refill: 1 - traMADol  (ULTRAM ) 50 MG tablet; Take 1 tablet (50 mg total) by mouth 2 (two) times daily as needed. for pain  Dispense: 60 tablet; Refill: 0  12. Muscle spasm - cyclobenzaprine  (FLEXERIL ) 10 MG tablet; Take 1 tablet (10 mg total) by mouth  at bedtime as needed.  Dispense: 30 tablet; Refill: 2 - Magnesium  13. Gastroesophageal reflux disease without esophagitis - omeprazole  (PRILOSEC) 40 MG capsule; TAKE 1 CAPSULE(40 MG) BY MOUTH DAILY  Dispense: 90 capsule; Refill: 1  14. B12 deficiency - B12 and Folate Panel  15. Other fatigue - CBC w/Diff/Platelet - Comprehensive metabolic panel with GFR - TSH + free T4 - Lipid Panel With LDL/HDL Ratio - PSA Total (Reflex To Free) - Hgb A1C w/o eAG - B12 and Folate Panel - Magnesium  17. Dysuria - UA/M w/rflx Culture, Routine - Microscopic Examination     General Counseling: jamon hayhurst understanding of the findings of todays visit and agrees with plan of treatment. I have discussed any further diagnostic evaluation that may  be needed or ordered today. We also reviewed his medications today. he has been encouraged to call the office with any questions or concerns that should arise related to todays visit.    Orders Placed This Encounter  Procedures   Microscopic Examination   CT CHEST ABDOMEN PELVIS WO CONTRAST   UA/M w/rflx Culture, Routine   CBC w/Diff/Platelet   Comprehensive metabolic panel with GFR   TSH + free T4   Lipid Panel With LDL/HDL Ratio   PSA Total (Reflex To Free)   Hgb A1C w/o eAG   B12 and Folate Panel   Magnesium   Ambulatory referral to Cardiology   EKG 12-Lead    Meds ordered this encounter  Medications   ALPRAZolam  (XANAX ) 0.5 MG tablet    Sig: Take 1 tablet (0.5 mg total) by mouth daily as needed. for anxiety    Dispense:  30 tablet    Refill:  1   buPROPion  (WELLBUTRIN  XL) 150 MG 24 hr tablet    Sig: TAKE 1 TABLET BY MOUTH EVERY DAY IN THE MORNING FOR ANXIETY    Dispense:  90 tablet    Refill:  1   cyclobenzaprine  (FLEXERIL ) 10 MG tablet    Sig: Take 1 tablet (10 mg total) by mouth at bedtime as needed.    Dispense:  30 tablet    Refill:  2   meloxicam  (MOBIC ) 15 MG tablet    Sig: Take 1 tablet (15 mg total) by mouth daily.     Dispense:  90 tablet    Refill:  1   omeprazole  (PRILOSEC) 40 MG capsule    Sig: TAKE 1 CAPSULE(40 MG) BY MOUTH DAILY    Dispense:  90 capsule    Refill:  1   traMADol  (ULTRAM ) 50 MG tablet    Sig: Take 1 tablet (50 mg total) by mouth 2 (two) times daily as needed. for pain    Dispense:  60 tablet    Refill:  0    Return for after test is done.   Total time spent:35 Minutes Time spent includes review of chart, medications, test results, and follow up plan with the patient.   [place] Controlled Substance Database was reviewed by me.  This patient was seen by Tinnie Pro, PA-C in collaboration with Dr. Sigrid Bathe as a part of collaborative care agreement.  Tinnie Pro, PA-C Internal medicine

## 2024-08-15 ENCOUNTER — Telehealth: Payer: Self-pay | Admitting: Physician Assistant

## 2024-08-15 LAB — UA/M W/RFLX CULTURE, ROUTINE
Bilirubin, UA: NEGATIVE
Glucose, UA: NEGATIVE
Ketones, UA: NEGATIVE
Leukocytes,UA: NEGATIVE
Nitrite, UA: NEGATIVE
Protein,UA: NEGATIVE
RBC, UA: NEGATIVE
Specific Gravity, UA: 1.025 (ref 1.005–1.030)
Urobilinogen, Ur: 0.2 mg/dL (ref 0.2–1.0)
pH, UA: 5 (ref 5.0–7.5)

## 2024-08-15 LAB — MICROSCOPIC EXAMINATION
Bacteria, UA: NONE SEEN
Casts: NONE SEEN /LPF

## 2024-08-15 NOTE — Telephone Encounter (Signed)
 Lvm notifying patient of CT appointment date, arrival time, location-Toni

## 2024-08-16 ENCOUNTER — Other Ambulatory Visit: Payer: Self-pay | Admitting: Physician Assistant

## 2024-08-16 DIAGNOSIS — M62838 Other muscle spasm: Secondary | ICD-10-CM

## 2024-08-18 ENCOUNTER — Ambulatory Visit: Attending: Cardiology | Admitting: Cardiology

## 2024-08-18 ENCOUNTER — Encounter: Payer: Self-pay | Admitting: Cardiology

## 2024-08-18 VITALS — BP 130/86 | HR 91 | Ht 72.0 in | Wt 279.0 lb

## 2024-08-18 DIAGNOSIS — I10 Essential (primary) hypertension: Secondary | ICD-10-CM

## 2024-08-18 DIAGNOSIS — R072 Precordial pain: Secondary | ICD-10-CM

## 2024-08-18 DIAGNOSIS — E782 Mixed hyperlipidemia: Secondary | ICD-10-CM | POA: Diagnosis not present

## 2024-08-18 DIAGNOSIS — R0609 Other forms of dyspnea: Secondary | ICD-10-CM | POA: Diagnosis not present

## 2024-08-18 DIAGNOSIS — F17200 Nicotine dependence, unspecified, uncomplicated: Secondary | ICD-10-CM

## 2024-08-18 MED ORDER — IVABRADINE HCL 5 MG PO TABS: 10.0000 mg | ORAL_TABLET | Freq: Once | ORAL | 0 refills | Status: AC

## 2024-08-18 MED ORDER — METOPROLOL TARTRATE 100 MG PO TABS: ORAL_TABLET | ORAL | 0 refills | Status: AC

## 2024-08-18 NOTE — Progress Notes (Signed)
 " Cardiology Office Note:    Date:  08/18/2024   ID:  Zachary Holmes, Zachary Holmes [birth year], MRN [id number]  PCP:  Kristina Tinnie MARLA DEVONNA   [facility] Providers Cardiologist:  None     Referring MD: Kristina Tinnie MARLA, PA-C   Chief Complaint  Patient presents with   Chest Pain    Patient was referred for chest pain and shortness of breath. Patient  states that he has been catching his breath a lot and he feels the pain in center of his sternum. Patient has been experiencing this for 6 months. Meds reviewed.    Zachary Holmes is a 64 y.o. male who is being seen today for the evaluation of chest pain at the request of McDonough, Tinnie MARLA, PA-C.   History of Present Illness:    Zachary Holmes is a 64 y.o. male with a hx of hypertension, hyperlipidemia, current smoker x 25+ years, anxiety, GERD presenting with chest pain.  Endorses chest pain and worsening shortness of breath ongoing over the past several months.  Symptoms of shortness of breath associated with exertion typically when walking his dog.  Also endorses left-sided chest pressure.  Denies edema, presyncope or syncope.  Father had a cardiac stent at age 73.  Currently smokes, working on quitting.  Past Medical History:  Diagnosis Date   Anxiety    Arthritis    legs (09/20/2014)   Depression    GERD (gastroesophageal reflux disease)    Hypertension    controlled on meds   OA (osteoarthritis) of shoulder    right   Seizures (HCC) 1983   x1, S/P head injury   Serratia marcescens infection 2011   left knee   Umbilical hernia    Wears dentures    partial upper    Past Surgical History:  Procedure Laterality Date   ANKLE FUSION Right 11/24/2007   thelbert 12/04/2010   COLONOSCOPY WITH PROPOFOL  N/A 05/13/2017   Procedure: COLONOSCOPY WITH PROPOFOL ;  Surgeon: Jinny Carmine, MD;  Location: [facility];  Service: Endoscopy;  Laterality: N/A;  requests early   COLONOSCOPY WITH PROPOFOL  N/A 12/04/2021    Procedure: COLONOSCOPY WITH BIOPSIES;  Surgeon: Jinny Carmine, MD;  Location: [facility];  Service: Endoscopy;  Laterality: N/A;   FRACTURE SURGERY     JOINT REPLACEMENT     ankle-right, knee-left, both shoulders   KNEE ARTHROCENTESIS Left X 5   got infected . . .   MULTIPLE TOOTH EXTRACTIONS     POLYPECTOMY N/A 12/04/2021   Procedure: POLYPECTOMY;  Surgeon: Jinny Carmine, MD;  Location: [facility];  Service: Endoscopy;  Laterality: N/A;   SHOULDER ARTHROSCOPY Left 06/2007   /notes 12/03/2010   TOTAL KNEE ARTHROPLASTY Left 2008   /notes 04/09/2010   TOTAL SHOULDER ARTHROPLASTY Left 09/20/2014   TOTAL SHOULDER ARTHROPLASTY Left 09/20/2014   Procedure: TOTAL SHOULDER ARTHROPLASTY;  Surgeon: Eva Elsie Herring, MD;  Location: [facility] OR;  Service: Orthopedics;  Laterality: Left;  Left total shoulder arthroplasty   TOTAL SHOULDER ARTHROPLASTY Right 06/02/2018   Procedure: RIGHT TOTAL SHOULDER ARTHROPLASTY;  Surgeon: Herring Eva, MD;  Location: [facility] OR;  Service: Orthopedics;  Laterality: Right;    Current Medications: Active Medications[1]   Allergies:   Patient has no known allergies.   Social History   Socioeconomic History   Marital status: Single    Spouse name: Not on file   Number of children: Not on file   Years of education: Not on file  Highest education level: Not on file  Occupational History   Not on file  Tobacco Use   Smoking status: Every Day    Current packs/day: 0.50    Average packs/day: 0.5 packs/day for 20.0 years (10.0 ttl pk-yrs)    Types: Cigarettes   Smokeless tobacco: Never   Tobacco comments:    1 pack every 2 days  Vaping Use   Vaping status: Never Used  Substance and Sexual Activity   Alcohol use: Not Currently    Alcohol/week: 12.0 standard drinks of alcohol    Types: 12 Cans of beer per week    Comment: SOBER SINCE 05/17/2017   Drug use: Yes    Types: Marijuana    Comment: DAILY   Sexual activity: Not Currently  Other  Topics Concern   Not on file  Social History Narrative   Not on file   Social Drivers of Health   Tobacco Use: High Risk (08/18/2024)   Patient History    Smoking Tobacco Use: Every Day    Smokeless Tobacco Use: Never    Passive Exposure: Not on file  Financial Resource Strain: Not on file  Food Insecurity: Not on file  Transportation Needs: Not on file  Physical Activity: Not on file  Stress: Not on file  Social Connections: Not on file  Depression (PHQ2-9): Low Risk (08/14/2024)   Depression (PHQ2-9)    PHQ-2 Score: 0  Alcohol Screen: Not on file  Housing: Not on file  Utilities: Not on file  Health Literacy: Not on file     Family History: The patient's family history includes COPD in his mother; Heart disease in his father.  ROS:   Please see the history of present illness.     All other systems reviewed and are negative.  EKGs/Labs/Other Studies Reviewed:    The following studies were reviewed today:  EKG Interpretation Date/Time:  Friday August 18 2024 14:20:07 EST Ventricular Rate:  91 PR Interval:  146 QRS Duration:  100 QT Interval:  358 QTC Calculation: 440 R Axis:   30  Text Interpretation: Sinus rhythm with occasional Premature ventricular complexes Confirmed by Darliss Rogue ([id number]) on 08/18/2024 2:34:26 PM    Recent Labs: No results found for requested labs within last 365 days.  Recent Lipid Panel    Component Value Date/Time   CHOL 194 07/14/2023 0943   TRIG 184 (H) 07/14/2023 0943   HDL 37 (L) 07/14/2023 0943   LDLCALC 124 (H) 07/14/2023 0943     Risk Assessment/Calculations:             Physical Exam:    VS:  BP 130/86 (BP Location: Left Arm, Patient Position: Sitting, Cuff Size: Large)   Pulse 91   Ht 6' (1.829 m)   Wt 279 lb (126.6 kg)   SpO2 (!) 89%   BMI 37.84 kg/m     Wt Readings from Last 3 Encounters:  08/18/24 279 lb (126.6 kg)  08/14/24 278 lb (126.1 kg)  05/12/24 270 lb (122.5 kg)     GEN:  Well  nourished, well developed in no acute distress HEENT: Normal NECK: No JVD; No carotid bruits CARDIAC: RRR, no murmurs, rubs, gallops RESPIRATORY: Diminished breath sounds, no wheezing. ABDOMEN: Soft, non-tender, non-distended MUSCULOSKELETAL:  No edema; No deformity  SKIN: Warm and dry NEUROLOGIC:  Alert and oriented x 3 PSYCHIATRIC:  Normal affect   ASSESSMENT:    1. Precordial pain   2. Primary hypertension   3. Mixed hyperlipidemia   4.  Dyspnea on exertion   5. Current smoker    PLAN:    In order of problems listed above:  Chest pain, several risk factors.  Obtain echo, obtain coronary CTA. Hypertension, BP controlled.  Continue Norvasc  5 mg daily. Hyperlipidemia, continue Zetia  10 mg daily. Dyspnea on exertion, cardiac workup with echo and CT as above.  Patient is also a smoker x 25+ years.  Referred to pulmonary medicine for COPD eval. Current smoker, smoking cessation advised.  Follow-up after cardiac testing     Medication Adjustments/Labs and Tests Ordered: Current medicines are reviewed at length with the patient today.  Concerns regarding medicines are outlined above.  Orders Placed This Encounter  Procedures   CT CORONARY MORPH W/CTA COR W/SCORE W/CA W/CM &/OR WO/CM   Basic metabolic panel with GFR   Ambulatory referral to Pulmonology   EKG 12-Lead   ECHOCARDIOGRAM COMPLETE   Meds ordered this encounter  Medications   metoprolol  tartrate (LOPRESSOR ) 100 MG tablet    Sig: TAKE 1 TABLET 2 HR PRIOR TO CARDIAC PROCEDURE    Dispense:  1 tablet    Refill:  0   ivabradine  (CORLANOR) 5 MG TABS tablet    Sig: Take 2 tablets (10 mg total) by mouth once for 1 dose. 2 hours prior to cardiac CTA    Dispense:  2 tablet    Refill:  0    Patient Instructions  Medication Instructions:  - take 100 mg metoprolol  and 10 mg ivabridine two hours prior to cardiac CTA   *If you need a refill on your cardiac medications before your next appointment, please call your  pharmacy*  Lab Work: Your provider would like for you to have following labs drawn today BMP.   If you have labs (blood work) drawn today and your tests are completely normal, you will receive your results only by: MyChart Message (if you have MyChart) OR A paper copy in the mail If you have any lab test that is abnormal or we need to change your treatment, we will call you to review the results.  Testing/Procedures: Your physician has requested that you have an echocardiogram. Echocardiography is a painless test that uses sound waves to create images of your heart. It provides your doctor with information about the size and shape of your heart and how well your hearts chambers and valves are working.   You may receive an ultrasound enhancing agent through an IV if needed to better visualize your heart during the echo. This procedure takes approximately one hour.  There are no restrictions for this procedure.  This will take place at [address] [facility]) [address], [place] [address]  Please note: We ask at that you not bring children with you during ultrasound (echo/ vascular) testing. Due to room size and safety concerns, children are not allowed in the ultrasound rooms during exams. Our front office staff cannot provide observation of children in our lobby area while testing is being conducted. An adult accompanying a patient to their appointment will only be allowed in the ultrasound room at the discretion of the ultrasound technician under special circumstances. We apologize for any inconvenience.   Your cardiac CT will be scheduled at one of the below locations:    [facility] [address] [place], [place] [address] [phone]  OR   [facility] [address]  [place], [place] [address]  If scheduled at the Heart and Vascular Tower  at [facility] street, please enter the parking lot using the [address]  street entrance and use the FREE valet service at the patient drop-off area. Enter the building and check-in with registration on the main floor.  If scheduled at [facility], please arrive to the Heart and Vascular Center 15 mins early for check-in and test prep.  There is spacious parking and easy access to the radiology department from the [facility] entrance. Please enter here and check-in with the desk attendant.   Please follow these instructions carefully (unless otherwise directed):  An IV will be required for this test and Nitroglycerin will be given.  Hold all erectile dysfunction medications at least 3 days (72 hrs) prior to test. (Ie viagra, cialis, sildenafil, tadalafil, etc)   On the Night Before the Test: Be sure to Drink plenty of water . Do not consume any caffeinated/decaffeinated beverages or chocolate 12 hours prior to your test. Do not take any antihistamines 12 hours prior to your test.  On the Day of the Test: Drink plenty of water  until 1 hour prior to the test. Do not eat any food 1 hour prior to test. You may take your regular medications prior to the test.  Take metoprolol  (Lopressor ) two hours prior to test. If you take Furosemide/Hydrochlorothiazide /Spironolactone/Chlorthalidone, please HOLD on the morning of the test. Patients who wear a continuous glucose monitor MUST remove the device prior to scanning.      After the Test: Drink plenty of water . After receiving IV contrast, you may experience a mild flushed feeling. This is normal. On occasion, you may experience a mild rash up to 24 hours after the test. This is not dangerous. If this occurs, you can take Benadryl  25 mg, Zyrtec, Claritin, or Allegra and increase your fluid intake. (Patients taking Tikosyn should avoid Benadryl , and may take Zyrtec, Claritin, or Allegra) If you experience trouble breathing, this can be serious. If it is severe call 911 IMMEDIATELY. If it is  mild, please call our office.  We will call to schedule your test 2-4 weeks out understanding that some insurance companies will need an authorization prior to the service being performed.   For more information and frequently asked questions, please visit our website : [email or web address]  For non-scheduling related questions, please contact the cardiac imaging nurse navigator should you have any questions/concerns: Cardiac Imaging Nurse Navigators Direct Office Dial: [phone]   For scheduling needs, including cancellations and rescheduling, please call Brittany, [phone].   Follow-Up: At [facility], you and your health needs are our priority.  As part of our continuing mission to provide you with exceptional heart care, our providers are all part of one team.  This team includes your primary Cardiologist (physician) and Advanced Practice Providers or APPs (Physician Assistants and Nurse Practitioners) who all work together to provide you with the care you need, when you need it.  Your next appointment:   3 month(s)  Provider:   You may see Dr Darliss or one of the following Advanced Practice Providers on your designated Care Team:   Lonni Meager, NP Lesley Maffucci, PA-C Bernardino Bring, PA-C Cadence Weinert, PA-C Tylene Lunch, NP Barnie Hila, NP    We recommend signing up for the patient portal called MyChart.  Sign up information is provided on this After Visit Summary.  MyChart is used to connect with patients for Virtual Visits (Telemedicine).  Patients are able to view lab/test results, encounter notes, upcoming appointments, etc.  Non-urgent messages  can be sent to your provider as well.   To learn more about what you can do with MyChart, go to [email or web address].              Signed, Redell Cave, MD  08/18/2024 3:48 PM    [facility]     [1]  Current Meds  Medication Sig   ALPRAZolam  (XANAX ) 0.5 MG  tablet Take 1 tablet (0.5 mg total) by mouth daily as needed. for anxiety   amLODipine  (NORVASC ) 5 MG tablet TAKE 1 TABLET(5 MG) BY MOUTH DAILY   buPROPion  (WELLBUTRIN  XL) 150 MG 24 hr tablet TAKE 1 TABLET BY MOUTH EVERY DAY IN THE MORNING FOR ANXIETY   cyclobenzaprine  (FLEXERIL ) 10 MG tablet Take 1 tablet (10 mg total) by mouth at bedtime as needed.   ezetimibe  (ZETIA ) 10 MG tablet TAKE 1 TABLET(10 MG) BY MOUTH DAILY   ivabradine  (CORLANOR) 5 MG TABS tablet Take 2 tablets (10 mg total) by mouth once for 1 dose. 2 hours prior to cardiac CTA   meloxicam  (MOBIC ) 15 MG tablet Take 1 tablet (15 mg total) by mouth daily.   metoprolol  tartrate (LOPRESSOR ) 100 MG tablet TAKE 1 TABLET 2 HR PRIOR TO CARDIAC PROCEDURE   omeprazole  (PRILOSEC) 40 MG capsule TAKE 1 CAPSULE(40 MG) BY MOUTH DAILY   traMADol  (ULTRAM ) 50 MG tablet Take 1 tablet (50 mg total) by mouth 2 (two) times daily as needed. for pain   "

## 2024-08-18 NOTE — Patient Instructions (Signed)
 Medication Instructions:  - take 100 mg metoprolol  and 10 mg ivabridine two hours prior to cardiac CTA   *If you need a refill on your cardiac medications before your next appointment, please call your pharmacy*  Lab Work: Your provider would like for you to have following labs drawn today BMP.   If you have labs (blood work) drawn today and your tests are completely normal, you will receive your results only by: MyChart Message (if you have MyChart) OR A paper copy in the mail If you have any lab test that is abnormal or we need to change your treatment, we will call you to review the results.  Testing/Procedures: Your physician has requested that you have an echocardiogram. Echocardiography is a painless test that uses sound waves to create images of your heart. It provides your doctor with information about the size and shape of your heart and how well your hearts chambers and valves are working.   You may receive an ultrasound enhancing agent through an IV if needed to better visualize your heart during the echo. This procedure takes approximately one hour.  There are no restrictions for this procedure.  This will take place at [address] [facility]) [address], [place] [address]  Please note: We ask at that you not bring children with you during ultrasound (echo/ vascular) testing. Due to room size and safety concerns, children are not allowed in the ultrasound rooms during exams. Our front office staff cannot provide observation of children in our lobby area while testing is being conducted. An adult accompanying a patient to their appointment will only be allowed in the ultrasound room at the discretion of the ultrasound technician under special circumstances. We apologize for any inconvenience.   Your cardiac CT will be scheduled at one of the below locations:    [facility] [address] [place], [place] [address] [phone]  OR    [facility] [address]  [place], [place] [address]  If scheduled at the Heart and Vascular Tower at [address], please enter the parking lot using the [address] street entrance and use the FREE valet service at the patient drop-off area. Enter the building and check-in with registration on the main floor.  If scheduled at [facility], please arrive to the Heart and Vascular Center 15 mins early for check-in and test prep.  There is spacious parking and easy access to the radiology department from the [facility] entrance. Please enter here and check-in with the desk attendant.   Please follow these instructions carefully (unless otherwise directed):  An IV will be required for this test and Nitroglycerin will be given.  Hold all erectile dysfunction medications at least 3 days (72 hrs) prior to test. (Ie viagra, cialis, sildenafil, tadalafil, etc)   On the Night Before the Test: Be sure to Drink plenty of water . Do not consume any caffeinated/decaffeinated beverages or chocolate 12 hours prior to your test. Do not take any antihistamines 12 hours prior to your test.  On the Day of the Test: Drink plenty of water  until 1 hour prior to the test. Do not eat any food 1 hour prior to test. You may take your regular medications prior to the test.  Take metoprolol  (Lopressor ) two hours prior to test. If you take Furosemide/Hydrochlorothiazide /Spironolactone/Chlorthalidone, please HOLD on the morning of the test. Patients who wear a continuous glucose monitor MUST remove the device prior  to scanning.      After the Test: Drink plenty of water . After receiving IV contrast, you may experience a mild flushed feeling. This is normal. On occasion, you may experience a mild rash up to 24 hours after the test. This is not dangerous. If this occurs, you can take Benadryl  25 mg, Zyrtec, Claritin, or Allegra and increase your fluid  intake. (Patients taking Tikosyn should avoid Benadryl , and may take Zyrtec, Claritin, or Allegra) If you experience trouble breathing, this can be serious. If it is severe call 911 IMMEDIATELY. If it is mild, please call our office.  We will call to schedule your test 2-4 weeks out understanding that some insurance companies will need an authorization prior to the service being performed.   For more information and frequently asked questions, please visit our website : [email or web address]  For non-scheduling related questions, please contact the cardiac imaging nurse navigator should you have any questions/concerns: Cardiac Imaging Nurse Navigators Direct Office Dial: [phone]   For scheduling needs, including cancellations and rescheduling, please call Brittany, [phone].   Follow-Up: At [facility], you and your health needs are our priority.  As part of our continuing mission to provide you with exceptional heart care, our providers are all part of one team.  This team includes your primary Cardiologist (physician) and Advanced Practice Providers or APPs (Physician Assistants and Nurse Practitioners) who all work together to provide you with the care you need, when you need it.  Your next appointment:   3 month(s)  Provider:   You may see Dr Darliss or one of the following Advanced Practice Providers on your designated Care Team:   Lonni Meager, NP Lesley Maffucci, PA-C Bernardino Bring, PA-C Cadence Faxon, PA-C Tylene Lunch, NP Barnie Hila, NP    We recommend signing up for the patient portal called MyChart.  Sign up information is provided on this After Visit Summary.  MyChart is used to connect with patients for Virtual Visits (Telemedicine).  Patients are able to view lab/test results, encounter notes, upcoming appointments, etc.  Non-urgent messages can be sent to your provider as well.   To learn more about what you can do with MyChart,  go to [email or web address].

## 2024-08-19 LAB — BASIC METABOLIC PANEL WITH GFR
BUN/Creatinine Ratio: 15 (ref 10–24)
BUN: 21 mg/dL (ref 8–27)
CO2: 20 mmol/L (ref 20–29)
Calcium: 9.9 mg/dL (ref 8.6–10.2)
Chloride: 101 mmol/L (ref 96–106)
Creatinine, Ser: 1.44 mg/dL — ABNORMAL HIGH (ref 0.76–1.27)
Glucose: 101 mg/dL — ABNORMAL HIGH (ref 70–99)
Potassium: 4.3 mmol/L (ref 3.5–5.2)
Sodium: 139 mmol/L (ref 134–144)
eGFR: 55 mL/min/1.73 — ABNORMAL LOW

## 2024-08-23 ENCOUNTER — Ambulatory Visit
Admission: RE | Admit: 2024-08-23 | Discharge: 2024-08-23 | Disposition: A | Discharge status: Home / Self Care | Source: Ambulatory Visit | Attending: Physician Assistant | Admitting: Physician Assistant

## 2024-08-23 DIAGNOSIS — R10A2 Flank pain, left side: Secondary | ICD-10-CM | POA: Insufficient documentation

## 2024-08-23 DIAGNOSIS — R351 Nocturia: Secondary | ICD-10-CM | POA: Diagnosis present

## 2024-08-23 DIAGNOSIS — R079 Chest pain, unspecified: Secondary | ICD-10-CM | POA: Insufficient documentation

## 2024-08-23 DIAGNOSIS — R0602 Shortness of breath: Secondary | ICD-10-CM | POA: Insufficient documentation

## 2024-08-30 ENCOUNTER — Telehealth: Payer: Self-pay | Admitting: Cardiology

## 2024-08-30 NOTE — Telephone Encounter (Signed)
 Called patient to review that medications are to both be taken 1.5 to 2 hours prior to CTA. Patient verbalized understanding

## 2024-08-30 NOTE — Telephone Encounter (Signed)
 Pt c/o medication issue:  1. Name of Medication:   metoprolol  tartrate (LOPRESSOR ) 100 MG tablet  ivabradine  (CORLANOR) 5 MG TABS tablet   2. How are you currently taking this medication (dosage and times per day)?   3. Are you having a reaction (difficulty breathing--STAT)?   4. What is your medication issue?   Patient wants a call back regarding taking these medications prior to his CT scan.

## 2024-09-01 ENCOUNTER — Encounter: Payer: Self-pay | Admitting: Pulmonary Disease

## 2024-09-01 ENCOUNTER — Other Ambulatory Visit
Admission: RE | Admit: 2024-09-01 | Discharge: 2024-09-01 | Disposition: A | Attending: Pulmonary Disease | Admitting: Pulmonary Disease

## 2024-09-01 ENCOUNTER — Ambulatory Visit: Admitting: Pulmonary Disease

## 2024-09-01 VITALS — BP 120/84 | HR 85 | Temp 97.6°F | Ht 72.0 in | Wt 283.0 lb

## 2024-09-01 DIAGNOSIS — R0609 Other forms of dyspnea: Secondary | ICD-10-CM | POA: Diagnosis present

## 2024-09-01 DIAGNOSIS — J449 Chronic obstructive pulmonary disease, unspecified: Secondary | ICD-10-CM | POA: Diagnosis not present

## 2024-09-01 DIAGNOSIS — R0989 Other specified symptoms and signs involving the circulatory and respiratory systems: Secondary | ICD-10-CM

## 2024-09-01 DIAGNOSIS — G4733 Obstructive sleep apnea (adult) (pediatric): Secondary | ICD-10-CM

## 2024-09-01 DIAGNOSIS — F17210 Nicotine dependence, cigarettes, uncomplicated: Secondary | ICD-10-CM | POA: Diagnosis not present

## 2024-09-01 DIAGNOSIS — E8801 Alpha-1-antitrypsin deficiency: Secondary | ICD-10-CM

## 2024-09-01 LAB — CBC WITH DIFFERENTIAL/PLATELET
Abs Immature Granulocytes: 0.05 10*3/uL (ref 0.00–0.07)
Basophils Absolute: 0.1 10*3/uL (ref 0.0–0.1)
Basophils Relative: 1 %
Eosinophils Absolute: 0.2 10*3/uL (ref 0.0–0.5)
Eosinophils Relative: 3 %
HCT: 46.7 % (ref 39.0–52.0)
Hemoglobin: 15 g/dL (ref 13.0–17.0)
Immature Granulocytes: 1 %
Lymphocytes Relative: 21 %
Lymphs Abs: 1.7 10*3/uL (ref 0.7–4.0)
MCH: 29.8 pg (ref 26.0–34.0)
MCHC: 32.1 g/dL (ref 30.0–36.0)
MCV: 92.8 fL (ref 80.0–100.0)
Monocytes Absolute: 0.4 10*3/uL (ref 0.1–1.0)
Monocytes Relative: 5 %
Neutro Abs: 5.5 10*3/uL (ref 1.7–7.7)
Neutrophils Relative %: 69 %
Platelets: 281 10*3/uL (ref 150–400)
RBC: 5.03 MIL/uL (ref 4.22–5.81)
RDW: 12.3 % (ref 11.5–15.5)
WBC: 7.9 10*3/uL (ref 4.0–10.5)
nRBC: 0 % (ref 0.0–0.2)

## 2024-09-01 NOTE — Progress Notes (Unsigned)
 "  Subjective:    Patient ID: Zachary Holmes, male    DOB: Aug 04, 1960, 64 y.o.   MRN: [id number]  Patient Care Team: Kristina Tinnie MARLA DEVONNA as PCP - General (Physician Assistant) Vincenzo Slough, [facility] (Inactive) as Pharmacist (Pharmacist)  Chief Complaint  Patient presents with   Consult    Shortness of breath on exertion x one year. Reports he can't catch his breath.    BACKGROUND:   HPI    Review of Systems A 10 point review of systems was performed and it is as noted above otherwise negative.   Past Medical History:  Diagnosis Date   Anxiety    Arthritis    legs (09/20/2014)   Depression    GERD (gastroesophageal reflux disease)    Hypertension    controlled on meds   OA (osteoarthritis) of shoulder    right   Seizures (HCC) 1983   x1, S/P head injury   Serratia marcescens infection 2011   left knee   Umbilical hernia    Wears dentures    partial upper    Past Surgical History:  Procedure Laterality Date   ANKLE FUSION Right 11/24/2007   thelbert 12/04/2010   COLONOSCOPY WITH PROPOFOL  N/A 05/13/2017   Procedure: COLONOSCOPY WITH PROPOFOL ;  Surgeon: Jinny Carmine, MD;  Location: [facility];  Service: Endoscopy;  Laterality: N/A;  requests early   COLONOSCOPY WITH PROPOFOL  N/A 12/04/2021   Procedure: COLONOSCOPY WITH BIOPSIES;  Surgeon: Jinny Carmine, MD;  Location: [facility];  Service: Endoscopy;  Laterality: N/A;   FRACTURE SURGERY     JOINT REPLACEMENT     ankle-right, knee-left, both shoulders   KNEE ARTHROCENTESIS Left X 5   got infected . . .   MULTIPLE TOOTH EXTRACTIONS     POLYPECTOMY N/A 12/04/2021   Procedure: POLYPECTOMY;  Surgeon: Jinny Carmine, MD;  Location: [facility];  Service: Endoscopy;  Laterality: N/A;   SHOULDER ARTHROSCOPY Left 06/2007   /notes 12/03/2010   TOTAL KNEE ARTHROPLASTY Left 2008   /notes 04/09/2010   TOTAL SHOULDER ARTHROPLASTY Left 09/20/2014   TOTAL SHOULDER ARTHROPLASTY Left 09/20/2014    Procedure: TOTAL SHOULDER ARTHROPLASTY;  Surgeon: Eva Elsie Herring, MD;  Location: [facility];  Service: Orthopedics;  Laterality: Left;  Left total shoulder arthroplasty   TOTAL SHOULDER ARTHROPLASTY Right 06/02/2018   Procedure: RIGHT TOTAL SHOULDER ARTHROPLASTY;  Surgeon: Herring Eva, MD;  Location: [facility] OR;  Service: Orthopedics;  Laterality: Right;    Patient Active Problem List   Diagnosis Date Noted   Other dysphagia 05/30/2019   Loud snoring 05/30/2019   Neoplasm of uncertain behavior of skin of lower extremity 05/30/2019   Encounter for long-term (current) use of medications 05/30/2019   S/P shoulder replacement, right 06/02/2018   Encounter for general adult medical examination with abnormal findings 05/27/2018   Dysuria 05/27/2018   Gastroesophageal reflux disease without esophagitis 02/16/2018   Chronic right shoulder pain 02/16/2018   Cramp and spasm 02/16/2018   Chronic osteoarthritis 02/16/2018   GAD (generalized anxiety disorder) 02/16/2018   Hx of colonic polyps    Benign neoplasm of ascending colon    Benign neoplasm of transverse colon    Osteoarthritis of left shoulder 09/20/2014   Essential hypertension 04/16/2010   KNEE JOINT REPLACEMENT BY OTHER MEANS 04/16/2010   OTH INFS INVOLVING BONE DZ CLASS ELSW LOWER LEG 04/01/2010    Family History  Problem Relation Age of Onset   COPD Mother    Heart disease Father  Social History   Tobacco Use   Smoking status: Every Day    Current packs/day: 0.25    Average packs/day: 0.9 packs/day for 27.1 years (24.8 ttl pk-yrs)    Types: Cigarettes    Start date: 1999   Smokeless tobacco: Never  Substance Use Topics   Alcohol use: Not Currently    Alcohol/week: 12.0 standard drinks of alcohol    Types: 12 Cans of beer per week    Comment: SOBER SINCE 05/17/2017    Allergies[1]  Active Medications[2]  Immunization History  Administered Date(s) Administered   Janssen (J&J) SARS-COV-2 Vaccination  11/12/2019   Zoster Recombinant(Shingrix) 02/17/2022   Zoster, Unspecified 03/26/2022        Objective:     Vitals:   09/01/24 0932  BP: 120/84  Pulse: 85  Temp: 97.6 F (36.4 C)  Height: 6' (1.829 m)  Weight: 283 lb (128.4 kg)  SpO2: 94%  TempSrc: Temporal  BMI (Calculated): 38.37     SpO2: 94 %  GENERAL: HEAD: Normocephalic, atraumatic.  EYES: Pupils equal, round, reactive to light.  No scleral icterus.  MOUTH:  NECK: Supple. No thyromegaly. Trachea midline. No JVD.  No adenopathy. PULMONARY: Good air entry bilaterally.  No adventitious sounds. CARDIOVASCULAR: S1 and S2. Regular rate and rhythm.  ABDOMEN: MUSCULOSKELETAL: No joint deformity, no clubbing, no edema.  NEUROLOGIC:  SKIN: Intact,warm,dry. PSYCH:        Assessment & Plan:     ICD-10-CM   1. Dyspnea on exertion  R06.09     2. COPD suggested by initial evaluation  R09.89     3. Heterozygous alpha 1-antitrypsin deficiency (HCC)  E88.01     4. OSA (obstructive sleep apnea)  G47.33       No orders of the defined types were placed in this encounter.   No orders of the defined types were placed in this encounter.    Advised if symptoms do not improve or worsen, to please contact office for sooner follow up or seek emergency care.    I spent xxx minutes of dedicated to the care of this patient on the date of this encounter to include pre-visit review of records, face-to-face time with the patient discussing conditions above, post visit ordering of testing, clinical documentation with the electronic health record, making appropriate referrals as documented, and communicating necessary findings to members of the patients care team.   C. Leita Sanders, MD Advanced Bronchoscopy P[facility]    *This note was dictated using voice recognition software/Dragon.  Despite best efforts to proofread, errors can occur which can change the meaning. Any transcriptional errors that  result from this process are unintentional and may not be fully corrected at the time of dictation.     [1] No Known Allergies [2]  Current Meds  Medication Sig   ALPRAZolam  (XANAX ) 0.5 MG tablet Take 1 tablet (0.5 mg total) by mouth daily as needed. for anxiety   amLODipine  (NORVASC ) 5 MG tablet TAKE 1 TABLET(5 MG) BY MOUTH DAILY   buPROPion  (WELLBUTRIN  XL) 150 MG 24 hr tablet TAKE 1 TABLET BY MOUTH EVERY DAY IN THE MORNING FOR ANXIETY   cyclobenzaprine  (FLEXERIL ) 10 MG tablet Take 1 tablet (10 mg total) by mouth at bedtime as needed.   ezetimibe  (ZETIA ) 10 MG tablet TAKE 1 TABLET(10 MG) BY MOUTH DAILY   meloxicam  (MOBIC ) 15 MG tablet Take 1 tablet (15 mg total) by mouth daily.   metoprolol  tartrate (LOPRESSOR ) 100 MG tablet TAKE 1 TABLET 2  HR PRIOR TO CARDIAC PROCEDURE   omeprazole  (PRILOSEC) 40 MG capsule TAKE 1 CAPSULE(40 MG) BY MOUTH DAILY   traMADol  (ULTRAM ) 50 MG tablet Take 1 tablet (50 mg total) by mouth 2 (two) times daily as needed. for pain   "

## 2024-09-01 NOTE — Patient Instructions (Addendum)
 VISIT SUMMARY:  During your visit, we discussed your shortness of breath that has been ongoing for the past twelve months, especially during physical activities. We also reviewed your history of smoking, orthopedic injuries, and family history of alpha-1 antitrypsin deficiency.  YOUR PLAN:  -CHRONIC OBSTRUCTIVE PULMONARY DISEASE (COPD): COPD is a chronic inflammatory lung disease that causes obstructed airflow from the lungs, often related to smoking. We have ordered pulmonary function tests and have provided you with samples of an inhaler, Trelegy, to help manage your symptoms.  The Trelegy 1 puff a day, make sure you rinse your mouth well after you use it.  Let us  know how you do with this inhaler so we can send a prescription in to your pharmacy if it works well for you.  Additionally, we have ordered blood work, including an alpha-1-antitrypsin level and a complete blood count, to further evaluate your condition.  -ALPHA-1-ANTITRYPSIN DEFICIENCY CARRIER STATE: Being a carrier of alpha-1-antitrypsin deficiency means you have one normal gene and one defective gene for this protein, which can affect lung function. We have ordered an alpha-1-antitrypsin enzyme level test to reassess your enzyme levels due to your shortness of breath and inflammation.  We will also recheck your phenotype to determine your exact gene make-up.  -NICOTINE  DEPENDENCE, CIGARETTES: Nicotine  dependence is an addiction to tobacco products caused by the drug nicotine . We discussed your plans to quit smoking and encouraged you to continue with this goal. We have coordinated with your primary care provider to start a Chantix prescription to help you quit smoking.  INSTRUCTIONS:  Please follow up with the pulmonary function tests and blood work as ordered. Continue with your plan to quit smoking and start the Chantix prescription with your primary care provider. We will reassess your condition after the test results are  available.

## 2024-09-04 ENCOUNTER — Encounter: Payer: Self-pay | Admitting: Pulmonary Disease

## 2024-09-04 MED ORDER — TRELEGY ELLIPTA 100-62.5-25 MCG/ACT IN AEPB: 1.0000 | INHALATION_SPRAY | Freq: Every day | RESPIRATORY_TRACT | 0 refills | Status: AC

## 2024-09-05 ENCOUNTER — Ambulatory Visit: Payer: Self-pay | Admitting: Pulmonary Disease

## 2024-09-05 LAB — ALPHA-1-ANTITRYPSIN PHENOTYP: A-1 Antitrypsin, Ser: 118 mg/dL (ref 101–187)

## 2024-09-05 NOTE — Telephone Encounter (Signed)
 LMTCB. E2C2 please advise when patient calls back.

## 2024-09-05 NOTE — Telephone Encounter (Signed)
 I have notified the patient. Nothing further needed.

## 2024-09-06 ENCOUNTER — Encounter (HOSPITAL_COMMUNITY): Payer: Self-pay

## 2024-09-06 ENCOUNTER — Telehealth (HOSPITAL_COMMUNITY): Payer: Self-pay | Admitting: Emergency Medicine

## 2024-09-06 NOTE — Telephone Encounter (Signed)
 Attempted to call patient regarding upcoming cardiac CT appointment. Left message on voicemail with name and callback number Rockwell Alexandria RN Navigator Cardiac Imaging [facility] [phone] Office [phone] Cell

## 2024-09-06 NOTE — Telephone Encounter (Signed)
 Received call from patient regarding upcoming cardiac imaging study; pt verbalizes understanding of appt date/time, parking situation and where to check in, pre-test NPO status and medications ordered, and verified current allergies; name and call back number provided for further questions should they arise Sid Seats RN Navigator Cardiac Imaging [facility] [phone] office [phone] cell

## 2024-09-07 ENCOUNTER — Ambulatory Visit: Payer: Self-pay | Admitting: Cardiology

## 2024-09-07 ENCOUNTER — Ambulatory Visit
Admission: RE | Admit: 2024-09-07 | Discharge: 2024-09-07 | Disposition: A | Source: Ambulatory Visit | Attending: Cardiology

## 2024-09-07 DIAGNOSIS — R072 Precordial pain: Secondary | ICD-10-CM

## 2024-09-07 MED ORDER — IOHEXOL 350 MG/ML SOLN
100.0000 mL | Freq: Once | INTRAVENOUS | Status: AC | PRN
  Administered 2024-09-07: 100 mL via INTRAVENOUS

## 2024-09-07 MED ORDER — NITROGLYCERIN 0.4 MG SL SUBL
0.8000 mg | SUBLINGUAL_TABLET | Freq: Once | SUBLINGUAL | Status: AC
  Administered 2024-09-07: 0.8 mg via SUBLINGUAL

## 2024-09-07 NOTE — Progress Notes (Signed)
 Patient tolerated CT well. Drank water after. Vital signs stable encourage to drink water throughout day.Reasons explained and verbalized understanding. Ambulated steady gait.

## 2024-09-08 ENCOUNTER — Ambulatory Visit

## 2024-09-08 ENCOUNTER — Other Ambulatory Visit: Payer: Self-pay

## 2024-09-08 DIAGNOSIS — R072 Precordial pain: Secondary | ICD-10-CM

## 2024-09-08 LAB — ECHOCARDIOGRAM COMPLETE
AR max vel: 3.22 cm2
AV Area VTI: 3.31 cm2
AV Area mean vel: 3.32 cm2
AV Mean grad: 5 mmHg
AV Peak grad: 9.5 mmHg
Ao pk vel: 1.54 m/s
Area-P 1/2: 4.16 cm2
S' Lateral: 3.74 cm

## 2024-09-08 MED ORDER — ASPIRIN 81 MG PO TBEC: 81.0000 mg | DELAYED_RELEASE_TABLET | Freq: Every day | ORAL | Status: AC

## 2024-09-08 MED ORDER — ATORVASTATIN CALCIUM 40 MG PO TABS: 40.0000 mg | ORAL_TABLET | Freq: Every day | ORAL | 3 refills | Status: AC

## 2024-09-25 ENCOUNTER — Ambulatory Visit: Admitting: Physician Assistant

## 2024-10-24 ENCOUNTER — Encounter

## 2024-10-24 ENCOUNTER — Ambulatory Visit: Admitting: Pulmonary Disease

## 2024-11-17 ENCOUNTER — Ambulatory Visit: Admitting: Cardiology

## 2025-08-16 ENCOUNTER — Ambulatory Visit: Admitting: Physician Assistant
